# Patient Record
Sex: Male | Born: 1965 | Race: Black or African American | Hispanic: No | Marital: Single | Smoking: Never smoker
Health system: Southern US, Community
[De-identification: ages and names within clinical notes are randomized; demographics above are authoritative.]

## PROBLEM LIST (undated history)

## (undated) DIAGNOSIS — M86651 Other chronic osteomyelitis, right thigh: Secondary | ICD-10-CM

## (undated) DIAGNOSIS — E669 Obesity, unspecified: Secondary | ICD-10-CM

## (undated) DIAGNOSIS — I10 Essential (primary) hypertension: Secondary | ICD-10-CM

## (undated) DIAGNOSIS — M545 Low back pain, unspecified: Secondary | ICD-10-CM

## (undated) DIAGNOSIS — F4310 Post-traumatic stress disorder, unspecified: Secondary | ICD-10-CM

## (undated) DIAGNOSIS — M1990 Unspecified osteoarthritis, unspecified site: Secondary | ICD-10-CM

## (undated) DIAGNOSIS — G894 Chronic pain syndrome: Secondary | ICD-10-CM

## (undated) DIAGNOSIS — R0600 Dyspnea, unspecified: Secondary | ICD-10-CM

## (undated) DIAGNOSIS — G8929 Other chronic pain: Secondary | ICD-10-CM

## (undated) HISTORY — PX: BACK SURGERY: SHX140

## (undated) HISTORY — PX: FRACTURE SURGERY: SHX138

## (undated) HISTORY — DX: Obesity, unspecified: E66.9

## (undated) HISTORY — DX: Essential (primary) hypertension: I10

---

## 1999-02-20 HISTORY — PX: KNEE ARTHROSCOPY W/ ACL RECONSTRUCTION: SHX1858

## 2005-02-19 HISTORY — PX: ANTERIOR CERVICAL DECOMP/DISCECTOMY FUSION: SHX1161

## 2005-06-19 HISTORY — PX: LABRAL REPAIR: SHX5172

## 2007-02-20 HISTORY — PX: RESECTION DISTAL CLAVICAL: SHX5053

## 2010-12-05 ENCOUNTER — Emergency Department (HOSPITAL_COMMUNITY)
Admission: EM | Admit: 2010-12-05 | Discharge: 2010-12-05 | Disposition: A | Discharge status: Home / Self Care | Payer: Medicare Other | Attending: Emergency Medicine | Admitting: Emergency Medicine

## 2010-12-05 DIAGNOSIS — M546 Pain in thoracic spine: Secondary | ICD-10-CM | POA: Insufficient documentation

## 2010-12-05 DIAGNOSIS — M542 Cervicalgia: Secondary | ICD-10-CM | POA: Insufficient documentation

## 2010-12-05 DIAGNOSIS — M25519 Pain in unspecified shoulder: Secondary | ICD-10-CM | POA: Insufficient documentation

## 2010-12-05 DIAGNOSIS — G8929 Other chronic pain: Secondary | ICD-10-CM | POA: Insufficient documentation

## 2011-02-18 ENCOUNTER — Emergency Department (HOSPITAL_COMMUNITY): Payer: Medicare Other

## 2011-02-18 ENCOUNTER — Emergency Department (HOSPITAL_COMMUNITY)
Admission: EM | Admit: 2011-02-18 | Discharge: 2011-02-18 | Disposition: A | Discharge status: Home / Self Care | Payer: Medicare Other | Attending: Emergency Medicine | Admitting: Emergency Medicine

## 2011-02-18 DIAGNOSIS — IMO0001 Reserved for inherently not codable concepts without codable children: Secondary | ICD-10-CM | POA: Insufficient documentation

## 2011-02-18 DIAGNOSIS — R059 Cough, unspecified: Secondary | ICD-10-CM | POA: Insufficient documentation

## 2011-02-18 DIAGNOSIS — Z79899 Other long term (current) drug therapy: Secondary | ICD-10-CM | POA: Insufficient documentation

## 2011-02-18 DIAGNOSIS — J069 Acute upper respiratory infection, unspecified: Secondary | ICD-10-CM

## 2011-02-18 DIAGNOSIS — R070 Pain in throat: Secondary | ICD-10-CM | POA: Insufficient documentation

## 2011-02-18 DIAGNOSIS — Z981 Arthrodesis status: Secondary | ICD-10-CM | POA: Insufficient documentation

## 2011-02-18 DIAGNOSIS — J3489 Other specified disorders of nose and nasal sinuses: Secondary | ICD-10-CM | POA: Insufficient documentation

## 2011-02-18 DIAGNOSIS — R6883 Chills (without fever): Secondary | ICD-10-CM | POA: Insufficient documentation

## 2011-02-18 DIAGNOSIS — R05 Cough: Secondary | ICD-10-CM | POA: Insufficient documentation

## 2011-02-18 MED ORDER — ACETAMINOPHEN-CODEINE 120-12 MG/5ML PO SUSP: 5.0000 mL | Freq: Three times a day (TID) | ORAL | Status: AC | PRN

## 2011-02-18 NOTE — ED Notes (Signed)
Pt stated he has been having a headache along with the cough for the past four days on left side of head.

## 2011-02-18 NOTE — ED Provider Notes (Signed)
History     CSN: [id number]  Arrival date & time 02/18/11  7829   First MD Initiated Contact with Patient 02/18/11 [phone]      Chief Complaint  Patient presents with  . Cough  . Nasal Congestion    (Consider location/radiation/quality/duration/timing/severity/associated sxs/prior treatment) Patient is a 45 y.o. male presenting with cough. The history is provided by the patient.  Cough This is a new problem. Episode onset: 4 days ago. The problem occurs hourly. The problem has not changed since onset.The cough is productive of sputum. There has been no fever. Associated symptoms include chills, rhinorrhea, sore throat and myalgias. Pertinent negatives include no chest pain, no shortness of breath and no eye redness. Treatments tried: TheraFlu and Mucinex with minimal relief. Risk factors: No known sick contacts. Patient is disabled and does not work. He is not a smoker. His past medical history does not include COPD or asthma.   moderate in severity. No pain described as "sore" no radiation of pain. No painful swallowing or difficulty breathing. No alleviating symptoms, does take narcotic medications for chronic pain, but that has not improved his cough any.  History reviewed. No pertinent past medical history.  Past Surgical History  Procedure Date  . Cervical fusion     History reviewed. No pertinent family history.  History  Substance Use Topics  . Smoking status: Never Smoker   . Smokeless tobacco: Not on file  . Alcohol Use: Yes     occ      Review of Systems  Constitutional: Positive for chills.  HENT: Positive for congestion, sore throat and rhinorrhea.   Eyes: Negative for photophobia and redness.  Respiratory: Positive for cough. Negative for shortness of breath.   Cardiovascular: Negative for chest pain, palpitations and leg swelling.  Genitourinary: Negative for dysuria and flank pain.  Musculoskeletal: Positive for myalgias.  Skin: Negative for rash.    Neurological: Negative for dizziness.  All other systems reviewed and are negative.    Allergies  Review of patient's allergies indicates no known allergies.  Home Medications   Current Outpatient Rx  Name Route Sig Dispense Refill  . CYCLOBENZAPRINE HCL 10 MG PO TABS Oral Take 10 mg by mouth 3 (three) times daily as needed. Muscle pain     . THERAFLU COLD/COUGH NIGHTTIME PO Oral Take 1 Package by mouth once. For cough and cold symptoms     . GUAIFENESIN ER 600 MG PO TB12 Oral Take 1,200 mg by mouth 2 (two) times daily. congestion     . HYDROCODONE-ACETAMINOPHEN 10-325 MG PO TABS Oral Take 1 tablet by mouth every 12 (twelve) hours as needed. Pain      . OXYCODONE HCL ER 10 MG PO TB12 Oral Take 10 mg by mouth every 12 (twelve) hours. Pain        BP 164/100  Pulse 96  Temp(Src) 98.8 F (37.1 C) (Oral)  Resp 22  SpO2 99%  Physical Exam  Constitutional: He is oriented to person, place, and time. He appears well-developed and well-nourished.  HENT:  Head: Normocephalic and atraumatic.       Nasal congestion  Eyes: Conjunctivae and EOM are normal. Pupils are equal, round, and reactive to light.  Neck: Trachea normal. Neck supple. No thyromegaly present.  Cardiovascular: Normal rate, regular rhythm, S1 normal, S2 normal and normal pulses.     No systolic murmur is present   No diastolic murmur is present  Pulses:      Radial pulses are 2+  on the right side, and 2+ on the left side.  Pulmonary/Chest: Effort normal and breath sounds normal. He has no wheezes. He has no rhonchi. He has no rales. He exhibits no tenderness.  Abdominal: Soft. Normal appearance and bowel sounds are normal. There is no tenderness. There is no CVA tenderness and negative Murphy's sign.  Musculoskeletal:       BLE:s Calves nontender, no cords or erythema, negative Homans sign  Neurological: He is alert and oriented to person, place, and time. He has normal strength. No cranial nerve deficit or sensory  deficit. GCS eye subscore is 4. GCS verbal subscore is 5. GCS motor subscore is 6.  Skin: Skin is warm and dry. No rash noted. He is not diaphoretic.  Psychiatric: His speech is normal.       Cooperative and appropriate    ED Course  Procedures (including critical care time)  Pulse ox 98% on room air is adequate.  Dg Chest 2 View  02/18/2011  *RADIOLOGY REPORT*  Clinical Data: Pain, cough, and congestion for 4 days.  CHEST - 2 VIEW  Comparison: None.  Findings: Shallow inspiration.  Normal heart size and pulmonary vascularity.  Slight fibrosis or linear atelectasis in the right lung base.  No focal airspace consolidation.  No blunting of costophrenic angles.  No pneumothorax.  Postoperative changes in the lower cervical spine.  IMPRESSION: No evidence of active pulmonary disease.  Original Report Authenticated By: Marlon Pel, M.D.      MDM  URI symptoms. Chest x-ray obtained and reviewed as above for productive cough. No pneumonia. No wheezes or shortness of breath or indication for albuterol. Plan discharge home with URI precautions and tells codeine as needed for cough. PCP followup as needed        Sunnie Nielsen, MD 02/18/11 [phone]

## 2011-02-18 NOTE — ED Notes (Signed)
Pt c/o cold sx x1wk. Pt c/o cold chills, cough, congestion. Pt in NAD at this time. Pt has productive cough and states sputum is green.

## 2011-02-26 ENCOUNTER — Emergency Department (HOSPITAL_COMMUNITY)
Admission: EM | Admit: 2011-02-26 | Discharge: 2011-02-26 | Disposition: A | Discharge status: Home / Self Care | Payer: Medicare Other | Attending: Emergency Medicine | Admitting: Emergency Medicine

## 2011-02-26 ENCOUNTER — Encounter (HOSPITAL_COMMUNITY): Payer: Self-pay | Admitting: Emergency Medicine

## 2011-02-26 DIAGNOSIS — Z79899 Other long term (current) drug therapy: Secondary | ICD-10-CM | POA: Insufficient documentation

## 2011-02-26 DIAGNOSIS — Z981 Arthrodesis status: Secondary | ICD-10-CM | POA: Insufficient documentation

## 2011-02-26 DIAGNOSIS — G8929 Other chronic pain: Secondary | ICD-10-CM | POA: Insufficient documentation

## 2011-02-26 DIAGNOSIS — M25519 Pain in unspecified shoulder: Secondary | ICD-10-CM | POA: Insufficient documentation

## 2011-02-26 NOTE — ED Provider Notes (Signed)
Medical screening examination/treatment/procedure(s) were performed by non-physician practitioner and as supervising physician I was immediately available for consultation/collaboration.  Doug Sou, MD 02/26/11 1752

## 2011-02-26 NOTE — ED Notes (Signed)
Pt sts sent here by pain management clinic for eval for PT

## 2011-02-26 NOTE — ED Provider Notes (Signed)
History     CSN: [id number]  Arrival date & time 02/26/11  1143   First MD Initiated Contact with Patient 02/26/11 1252      Chief Complaint  Patient presents with  . Medical Clearance    (Consider location/radiation/quality/duration/timing/severity/associated sxs/prior treatment) HPI  46 year old male presenting to the ED requesting for referral to an orthopedic doctor for his chronic right shoulder pain. Patient states he has had right shoulder pain for the past several years and has been managed by a orthopedic doctor in [place]. 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  He is currently under the care of the pain management clinic ([facility]: [phone]). Pt sts he was advised to come to the ER to request referral for outpt therapy for his R shoulder.  Patient denies increasing shoulder pain, or recent trauma. Patient is not requesting for pain medication at this time. His only request is for a referral so he can have an outpatient therapy.  History reviewed. No pertinent past medical history.  Past Surgical History  Procedure Date  . Cervical fusion     History reviewed. No pertinent family history.  History  Substance Use Topics  . Smoking status: Never Smoker   . Smokeless tobacco: Not on file  . Alcohol Use: Yes     occ      Review of Systems  All other systems reviewed and are negative.    Allergies  Review of patient's allergies indicates no known allergies.  Home Medications   Current Outpatient Rx  Name Route Sig Dispense Refill  . ACETAMINOPHEN-CODEINE 120-12 MG/5ML PO SUSP Oral Take 5 mLs by mouth every 8 (eight) hours as needed (Take as needed for cough. No Driving while medicated.). 60 mL 0  . VITAMIN D-3 5000 UNITS PO TABS Oral Take 1 tablet by mouth once a week.      . CYCLOBENZAPRINE HCL 10 MG PO TABS Oral Take 10 mg by mouth 3 (three) times daily as needed. Muscle pain     .  HYDROCODONE-ACETAMINOPHEN 10-325 MG PO TABS Oral Take 1 tablet by mouth every 12 (twelve) hours as needed. Pain      . OXYCODONE HCL ER 10 MG PO TB12 Oral Take 10 mg by mouth every 12 (twelve) hours. Pain        BP 179/102  Pulse 96  Temp(Src) 98 F (36.7 C) (Oral)  Resp 20  SpO2 97%  Physical Exam  Nursing note and vitals reviewed. Constitutional: He appears well-developed and well-nourished. No distress.       Awake, alert, nontoxic appearance  HENT:  Head: Atraumatic.  Eyes: Right eye exhibits no discharge. Left eye exhibits no discharge.  Neck: Neck supple.  Cardiovascular: Normal rate.   Pulmonary/Chest: Effort normal. He exhibits no tenderness.  Abdominal: There is no tenderness. There is no rebound.  Musculoskeletal: He exhibits no tenderness.       Right shoulder: He exhibits decreased range of motion and decreased strength. He exhibits no tenderness, no bony tenderness, no swelling and no deformity.       Left shoulder: Normal.       Right elbow: Normal.      Baseline ROM, no obvious new focal weakness  Neurological:       Mental status and motor strength appears baseline for patient and situation  Skin: No rash noted.  Psychiatric: He has a normal mood and affect.    ED Course  Procedures (including critical care time)  Labs Reviewed - No data to display No results found.   No diagnosis found.    MDM  Pt in no acute distress.  R shoulder pain is chronic.  He has no acutte changes to his pain or having other sxs.  At this time, i will give referral to orthopedic doctor for further management.         Fayrene Helper, PA 02/26/11 1427

## 2012-04-10 ENCOUNTER — Ambulatory Visit: Payer: Medicare Other | Attending: Internal Medicine | Admitting: Rehabilitative and Restorative Service Providers"

## 2012-04-10 DIAGNOSIS — M542 Cervicalgia: Secondary | ICD-10-CM | POA: Insufficient documentation

## 2012-04-10 DIAGNOSIS — M2569 Stiffness of other specified joint, not elsewhere classified: Secondary | ICD-10-CM | POA: Insufficient documentation

## 2012-04-10 DIAGNOSIS — IMO0001 Reserved for inherently not codable concepts without codable children: Secondary | ICD-10-CM | POA: Insufficient documentation

## 2012-04-10 DIAGNOSIS — M545 Low back pain, unspecified: Secondary | ICD-10-CM | POA: Insufficient documentation

## 2012-04-17 ENCOUNTER — Ambulatory Visit: Payer: Medicare Other | Admitting: Physical Therapy

## 2012-04-22 ENCOUNTER — Encounter (HOSPITAL_COMMUNITY): Payer: Self-pay | Admitting: Emergency Medicine

## 2012-04-22 ENCOUNTER — Emergency Department (HOSPITAL_COMMUNITY): Payer: Medicare Other

## 2012-04-22 ENCOUNTER — Emergency Department (HOSPITAL_COMMUNITY)
Admission: EM | Admit: 2012-04-22 | Discharge: 2012-04-22 | Disposition: A | Discharge status: Home / Self Care | Payer: Medicare Other | Attending: Emergency Medicine | Admitting: Emergency Medicine

## 2012-04-22 ENCOUNTER — Ambulatory Visit: Payer: Medicare Other | Attending: Internal Medicine

## 2012-04-22 DIAGNOSIS — IMO0001 Reserved for inherently not codable concepts without codable children: Secondary | ICD-10-CM | POA: Insufficient documentation

## 2012-04-22 DIAGNOSIS — R51 Headache: Secondary | ICD-10-CM | POA: Insufficient documentation

## 2012-04-22 DIAGNOSIS — R0982 Postnasal drip: Secondary | ICD-10-CM | POA: Insufficient documentation

## 2012-04-22 DIAGNOSIS — M545 Low back pain, unspecified: Secondary | ICD-10-CM | POA: Insufficient documentation

## 2012-04-22 DIAGNOSIS — M542 Cervicalgia: Secondary | ICD-10-CM | POA: Insufficient documentation

## 2012-04-22 DIAGNOSIS — Z79899 Other long term (current) drug therapy: Secondary | ICD-10-CM | POA: Insufficient documentation

## 2012-04-22 DIAGNOSIS — J069 Acute upper respiratory infection, unspecified: Secondary | ICD-10-CM | POA: Insufficient documentation

## 2012-04-22 DIAGNOSIS — M2569 Stiffness of other specified joint, not elsewhere classified: Secondary | ICD-10-CM | POA: Insufficient documentation

## 2012-04-22 DIAGNOSIS — R05 Cough: Secondary | ICD-10-CM | POA: Insufficient documentation

## 2012-04-22 DIAGNOSIS — R059 Cough, unspecified: Secondary | ICD-10-CM | POA: Insufficient documentation

## 2012-04-22 DIAGNOSIS — J00 Acute nasopharyngitis [common cold]: Secondary | ICD-10-CM | POA: Insufficient documentation

## 2012-04-22 MED ORDER — HYDROCODONE-HOMATROPINE 5-1.5 MG/5ML PO SYRP: 2.5000 mL | ORAL_SOLUTION | Freq: Four times a day (QID) | ORAL | Status: DC | PRN

## 2012-04-22 NOTE — ED Notes (Signed)
Patient complaining of cold symptoms that started four days ago; symptoms worsening tonight.  Patient complaining of productive cough (with green sputum production), mid-back pain, nasal congestion, watery eyes, and sinus drainage.  Patient denies shortness of breath and chest pain.  Upon arrival to room, patient connected to continuous blood pressure and pulse ox monitor.  Will continue to monitor.

## 2012-04-22 NOTE — ED Provider Notes (Signed)
Medical screening examination/treatment/procedure(s) were performed by non-physician practitioner and as supervising physician I was immediately available for consultation/collaboration.  Jones Skene, M.D.     Jones Skene, MD 04/22/12 [phone]

## 2012-04-22 NOTE — ED Provider Notes (Signed)
History     CSN: [id number]  Arrival date & time 04/22/12  0407   First MD Initiated Contact with Patient 04/22/12 [phone]      Chief Complaint  Patient presents with  . Nasal Congestion  . Cough    (Consider location/radiation/quality/duration/timing/severity/associated sxs/prior treatment) HPI SUBJECTIVE:  Dustin [name] is a 47 y.o. male who complains of coryza, post nasal drip, productive cough, myalgias, headache and bilateral sinus pain for 4 days. He denies a history of chest pain, dizziness, fatigue, nausea, rashshortness of breath, sweats, vomiting, weakness, weight loss, wheezing and cough and denies a history of asthma. Patient denies smoke cigarettes. Patient recently babysitting his 3 grandchildren who all had similar sxs.     PLAN:   History reviewed. No pertinent past medical history.  Past Surgical History  Procedure Laterality Date  . Cervical fusion    . Shoulder surgery      History reviewed. No pertinent family history.  History  Substance Use Topics  . Smoking status: Never Smoker   . Smokeless tobacco: Not on file  . Alcohol Use: Yes     Comment: occ      Review of Systems  Allergies  Review of patient's allergies indicates no known allergies.  Home Medications   Current Outpatient Rx  Name  Route  Sig  Dispense  Refill  . Cholecalciferol (VITAMIN D-3) 5000 UNITS TABS   Oral   Take 1 tablet by mouth once a week.           . cyclobenzaprine (FLEXERIL) 10 MG tablet   Oral   Take 10 mg by mouth 3 (three) times daily as needed. Muscle pain          . HYDROcodone-acetaminophen (NORCO) 10-325 MG per tablet   Oral   Take 1 tablet by mouth every 12 (twelve) hours as needed. Pain           . oxyCODONE (OXYCONTIN) 10 MG 12 hr tablet   Oral   Take 10 mg by mouth every 12 (twelve) hours. Pain             BP 163/107  Pulse 100  Temp(Src) 97.6 F (36.4 C) (Oral)  Resp 20  SpO2 98%  Physical Exam  Appears moderately ill but  not toxic; temperature as noted in vitals. Ears normal. Eyes:glassy appearance, no discharge  Heart: RRR, NO M/G/R Throat and pharynx normal.   Neck supple. No adenopathyhy in the neck.  Sinuses non tender.  The chest is clear. Abdomen is soft and nontender   ED Course  Procedures (including critical care time)  Labs Reviewed - No data to display No results found.   No diagnosis found.    MDM  5:05 AM Patient symptoms consistent with viral URI. Requests cxr.  Symptomatic therapy suggested: push fluids, rest and gargle warm salt water. Lack of antibiotic effectiveness discussed with him.   Pt CXR negative for acute infiltrate. Patients symptoms are consistent with URI, likely viral etiology. Discussed that antibiotics are not indicated for viral infections. Pt will be discharged with symptomatic treatment.  Verbalizes understanding and is agreeable with plan. Pt is hemodynamically stable & in NAD prior to dc.     Arthor Captain, PA-C 04/22/12 [phone]

## 2012-04-22 NOTE — ED Notes (Signed)
Patient currently resting quietly in bed; no respiratory or acute distress noted.  Patient updated on plan of care; informed patient that we are currently waiting on further orders/disposition from EDP/PA.  Patient denies any other needs at this time; will continue to monitor.

## 2012-04-22 NOTE — ED Notes (Signed)
Patient given copy of discharge paperwork; went over discharge instructions with patient.  Patient instructed to take prescriptions as directed, to not take prescriptions while taking other prescribed narcotics, to not drive/operate heavy machinery while taking narcotics, to follow up with PCP within four days, and to return to the ED for new, worsening, or concerning symptoms.

## 2012-04-24 ENCOUNTER — Encounter: Payer: Medicare Other | Admitting: Physical Therapy

## 2012-04-24 ENCOUNTER — Ambulatory Visit: Payer: Medicare Other | Admitting: Physical Therapy

## 2012-04-29 ENCOUNTER — Encounter: Payer: Medicare Other | Admitting: Physical Therapy

## 2012-06-04 ENCOUNTER — Emergency Department (HOSPITAL_COMMUNITY): Payer: Medicare Other

## 2012-06-04 ENCOUNTER — Encounter (HOSPITAL_COMMUNITY): Payer: Self-pay | Admitting: Family Medicine

## 2012-06-04 ENCOUNTER — Emergency Department (HOSPITAL_COMMUNITY)
Admission: EM | Admit: 2012-06-04 | Discharge: 2012-06-04 | Disposition: A | Discharge status: Home / Self Care | Payer: Medicare Other | Attending: Emergency Medicine | Admitting: Emergency Medicine

## 2012-06-04 DIAGNOSIS — I10 Essential (primary) hypertension: Secondary | ICD-10-CM

## 2012-06-04 DIAGNOSIS — Z79899 Other long term (current) drug therapy: Secondary | ICD-10-CM | POA: Insufficient documentation

## 2012-06-04 DIAGNOSIS — J069 Acute upper respiratory infection, unspecified: Secondary | ICD-10-CM

## 2012-06-04 MED ORDER — LISINOPRIL 10 MG PO TABS
5.0000 mg | ORAL_TABLET | Freq: Once | ORAL | Status: AC
  Administered 2012-06-04: 5 mg via ORAL
  Filled 2012-06-04: qty 2

## 2012-06-04 NOTE — ED Notes (Signed)
Pt sent here for follow up chest xray. sts cough and acute bronchitis. sts has been taking abx

## 2012-06-04 NOTE — ED Provider Notes (Signed)
History     CSN: [id number]  Arrival date & time 06/04/12  1135   First MD Initiated Contact with Patient 06/04/12 1147      Chief Complaint  Patient presents with  . Cough    (Consider location/radiation/quality/duration/timing/severity/associated sxs/prior treatment) HPI  Patient presents to the ED with complaints of continued cough. He was seen earlier and told he has a URI. He was given abx and some cough medication. He feels as though the cough has moved deeper and "wet- er" now. He would like to get rechecked to make sure he is not developing a pneumonia. He denies fevers, chills, nausea, vomiting, diarrhea. nad vss  History reviewed. No pertinent past medical history.  Past Surgical History  Procedure Laterality Date  . Cervical fusion    . Shoulder surgery      History reviewed. No pertinent family history.  History  Substance Use Topics  . Smoking status: Never Smoker   . Smokeless tobacco: Not on file  . Alcohol Use: Yes     Comment: occ      Review of Systems  Respiratory: Positive for cough. Negative for chest tightness, shortness of breath and wheezing.   All other systems reviewed and are negative.    Allergies  Review of patient's allergies indicates no known allergies.  Home Medications   Current Outpatient Rx  Name  Route  Sig  Dispense  Refill  . Ascorbic Acid (VITAMIN C PO)   Oral   Take 1 tablet by mouth daily.         . cyclobenzaprine (FLEXERIL) 10 MG tablet   Oral   Take 10 mg by mouth 3 (three) times daily as needed. Muscle pain          . oxyCODONE (OXYCONTIN) 40 MG 12 hr tablet   Oral   Take 40 mg by mouth every 12 (twelve) hours.         Marland Kitchen oxyCODONE (ROXICODONE) 15 MG immediate release tablet   Oral   Take 15 mg by mouth every 6 (six) hours as needed for pain.           BP 168/116  Pulse 89  Temp(Src) 98.2 F (36.8 C) (Oral)  Resp 18  SpO2 97%  Physical Exam  Nursing note and vitals  reviewed. Constitutional: He appears well-developed and well-nourished. No distress.  HENT:  Head: Normocephalic and atraumatic.  Eyes: Pupils are equal, round, and reactive to light.  Neck: Normal range of motion. Neck supple.  Cardiovascular: Normal rate and regular rhythm.   Pulmonary/Chest: Effort normal.  Abdominal: Soft.  Neurological: He is alert.  Skin: Skin is warm and dry.    ED Course  Procedures (including critical care time)  Labs Reviewed - No data to display Dg Chest 2 View  06/04/2012  *RADIOLOGY REPORT*  Clinical Data: Follow-up respiratory infection treated with antibiotics  CHEST - 2 VIEW  Comparison: 04/1938 1014  Findings: Heart size is normal.  Mediastinal shadows are normal. The lungs are clear.  No effusions.  No bony abnormalities.  IMPRESSION: Normal chest   Original Report Authenticated By: Paulina Fusi, M.D.      1. URI (upper respiratory infection)   2. Hypertension       MDM  Blood pressure is elevated.  Will have nurse recheck.  BP is still elevated- patient has been seen for BP before he said. He is supposed to be on lisinopril but wanted to try to control hypertension with exercise and diet.  Discussed the severe risks of BP staying that elevated. He agreed to start taking his Lisinopril and see his PCP. First dose given in ED. Asymptomatic.    Exam and chest xray shows no acute abnormalities.  Pt has been advised of the symptoms that warrant their return to the ED. Patient has voiced understanding and has agreed to follow-up with the PCP or specialist.         Dorthula Matas, PA-C 06/04/12 1310

## 2012-06-06 NOTE — ED Provider Notes (Signed)
Medical screening examination/treatment/procedure(s) were performed by non-physician practitioner and as supervising physician I was immediately available for consultation/collaboration.  Jmari T Ayisha Pol, MD 06/06/12 0821 

## 2012-07-18 ENCOUNTER — Encounter: Payer: Self-pay | Admitting: *Deleted

## 2012-07-18 ENCOUNTER — Encounter: Payer: Medicare Other | Attending: Internal Medicine | Admitting: *Deleted

## 2012-07-18 DIAGNOSIS — I10 Essential (primary) hypertension: Secondary | ICD-10-CM | POA: Insufficient documentation

## 2012-07-18 DIAGNOSIS — Z713 Dietary counseling and surveillance: Secondary | ICD-10-CM | POA: Insufficient documentation

## 2012-07-18 DIAGNOSIS — E669 Obesity, unspecified: Secondary | ICD-10-CM | POA: Insufficient documentation

## 2012-07-18 NOTE — Patient Instructions (Signed)
Plan:  Aim for 600 mg Sodium per meal (2000 mg per day)  Consider reading food labels for Total Sodium of foods Consider increasing your activity level by biking for 30 minutes daily as tolerated

## 2012-07-18 NOTE — Progress Notes (Signed)
  Medical Nutrition Therapy:  Appt start time: 0800 end time:  0900.  Assessment:  Primary concerns today: patient here for obesity and HTN. He lives with daughter and grandchildren. He shops for his food and they both prepare the meals. He is disabled so is home during the day. He prefers to not add seasonings especially salt to his food. Sleeps from about MN to about 4 AM and then takes nap around 12 noon. He also states he does a colon cleanse once a month for 5 days. Activity level is limited due to shoulder problems. He used to mountain bike a couple of years ago, but sold his bike when he moved here about 18 months ago. Expresses interest in riding again.  MEDICATIONS: see list   DIETARY INTAKE:  Usual eating pattern includes 3 meals and 1-2 snacks per day.  Everyday foods include good variety of all food groups, low salt and low fat .  Avoided foods include milk, beef, pork, high fat foods.    24-hr recall:  B ( AM): PNB oatmeal with cinnamon and honey mixture OR scrambled eggs with olive oil, ginger and pepper, garlic powder with 1 slice raisin toast, fresy apple cinnamon and honey tea Snk ( AM):2 fruited granola bars or celery with PNB and flavored water  L ( PM): salad with nuts, cranberries, chicken, egg, other vegetables, with raspberry vinaigrette dressing OR baked fish with vegetables and/or salad Snk ( PM): not usually D ( PM): daughter cooks later at night; pasta with vegetables and lean meat Snk ( PM): none Beverages: flavored water, hot tea with honey and cinammon Lemon, maple syrup and cayenne pepper capsules all day with no food for 5 days each month  Usual physical activity: walks as able and a lot of stretches with the band. Has swam in the past at [facility] or [place], not lately, got bored  Progress Towards Goal(s):  In progress.   Nutritional Diagnosis:  NB-1.1 Food and nutrition-related knowledge deficit As related to HTN and obesity.  As evidenced by BMI of 36.3.     Intervention:  Nutrition counseling for weight loss and HTN initiated. His eating habits are adequate for sodium intake with a couple of exceptions which I clarified with him. [facility] still has sodium in it.). We discussed options for increasing his activity level taking his bad shoulders into consideration and he became excited about getting back into cycling. He was unaware of the multiple trails in this area so I provided him with handouts on local trails. Also introduced him to [employer] APP to help him reduce his sodium intake. Plan:  Aim for 600 mg Sodium per meal (2000 mg per day)  Consider reading food labels for Total Sodium of foods Consider increasing your activity level by biking for 30 minutes daily as tolerated  Use [employer] APP for sodium content of foods and restaurant  Handouts given during visit include:  Food Label handouts Bike Trails in [place]  Monitoring/Evaluation:  Dietary intake, exercise, read food labels for sodium content of foods, and body weight prn.

## 2012-09-16 ENCOUNTER — Encounter (HOSPITAL_BASED_OUTPATIENT_CLINIC_OR_DEPARTMENT_OTHER): Payer: Self-pay | Admitting: *Deleted

## 2012-09-16 ENCOUNTER — Emergency Department (HOSPITAL_BASED_OUTPATIENT_CLINIC_OR_DEPARTMENT_OTHER)
Admission: EM | Admit: 2012-09-16 | Discharge: 2012-09-16 | Disposition: A | Discharge status: Home / Self Care | Payer: Medicare Other | Attending: Emergency Medicine | Admitting: Emergency Medicine

## 2012-09-16 DIAGNOSIS — R0982 Postnasal drip: Secondary | ICD-10-CM | POA: Insufficient documentation

## 2012-09-16 DIAGNOSIS — Z91040 Latex allergy status: Secondary | ICD-10-CM | POA: Insufficient documentation

## 2012-09-16 DIAGNOSIS — G8929 Other chronic pain: Secondary | ICD-10-CM | POA: Insufficient documentation

## 2012-09-16 DIAGNOSIS — E669 Obesity, unspecified: Secondary | ICD-10-CM | POA: Insufficient documentation

## 2012-09-16 DIAGNOSIS — I10 Essential (primary) hypertension: Secondary | ICD-10-CM | POA: Insufficient documentation

## 2012-09-16 DIAGNOSIS — R062 Wheezing: Secondary | ICD-10-CM | POA: Insufficient documentation

## 2012-09-16 DIAGNOSIS — Z79899 Other long term (current) drug therapy: Secondary | ICD-10-CM | POA: Insufficient documentation

## 2012-09-16 MED ORDER — LORATADINE 10 MG PO TABS: 10.0000 mg | ORAL_TABLET | Freq: Every day | ORAL | Status: DC

## 2012-09-16 MED ORDER — DOXYCYCLINE HYCLATE 100 MG PO CAPS: 100.0000 mg | ORAL_CAPSULE | Freq: Two times a day (BID) | ORAL | Status: DC

## 2012-09-16 MED ORDER — BENZONATATE 100 MG PO CAPS: 100.0000 mg | ORAL_CAPSULE | Freq: Three times a day (TID) | ORAL | Status: DC

## 2012-09-16 MED ORDER — FLUTICASONE PROPIONATE 50 MCG/ACT NA SUSP: 2.0000 | Freq: Every day | NASAL | Status: DC

## 2012-09-16 NOTE — ED Notes (Addendum)
Pt c/o sinus congestion and cough x3 days. Pt also c/o cough productive of brown sputum.

## 2012-09-16 NOTE — ED Provider Notes (Signed)
CSN: [id number]     Arrival date & time 09/16/12  3086 History     First MD Initiated Contact with Patient 09/16/12 0303     Chief Complaint  Patient presents with  . Sinusitis   (Consider location/radiation/quality/duration/timing/severity/associated sxs/prior Treatment) Patient is a 47 y.o. male presenting with sinusitis. The history is provided by the patient.  Sinusitis Pain details:    Location:  Maxillary   Quality:  Dull   Severity:  Mild   Timing:  Constant Progression:  Unchanged Context: not smoke inhalation   Relieved by:  Nothing Worsened by:  Nothing tried Ineffective treatments:  None tried Associated symptoms: rhinorrhea and sneezing   Associated symptoms: no chest pain, no fever, no headaches and no sore throat   Risk factors: no allergic reaction     Past Medical History  Diagnosis Date  . Hypertension   . Obesity   . Chronic back pain    Past Surgical History  Procedure Laterality Date  . Cervical fusion    . Shoulder surgery     No family history on file. History  Substance Use Topics  . Smoking status: Never Smoker   . Smokeless tobacco: Never Used  . Alcohol Use: Yes     Comment: occasional, special events like a wedding    Review of Systems  Constitutional: Negative for fever.  HENT: Positive for rhinorrhea, sneezing, postnasal drip and sinus pressure. Negative for sore throat, drooling, mouth sores, trouble swallowing, neck pain, neck stiffness and voice change.   Cardiovascular: Negative for chest pain.  Neurological: Negative for headaches.  All other systems reviewed and are negative.    Allergies  Latex  Home Medications   Current Outpatient Rx  Name  Route  Sig  Dispense  Refill  . Ascorbic Acid (VITAMIN C PO)   Oral   Take 1 tablet by mouth daily.         . benzonatate (TESSALON) 100 MG capsule   Oral   Take 1 capsule (100 mg total) by mouth every 8 (eight) hours.   21 capsule   0   . cyclobenzaprine (FLEXERIL)  10 MG tablet   Oral   Take 10 mg by mouth 3 (three) times daily as needed. Muscle pain          . doxycycline (VIBRAMYCIN) 100 MG capsule   Oral   Take 1 capsule (100 mg total) by mouth 2 (two) times daily. One po bid x 7 days   14 capsule   0   . fluticasone (FLONASE) 50 MCG/ACT nasal spray   Nasal   Place 2 sprays into the nose daily.   16 g   0   . loratadine (CLARITIN) 10 MG tablet   Oral   Take 1 tablet (10 mg total) by mouth daily.   7 tablet   0   . oxyCODONE (OXYCONTIN) 40 MG 12 hr tablet   Oral   Take 40 mg by mouth every 12 (twelve) hours.         Marland Kitchen oxyCODONE (ROXICODONE) 15 MG immediate release tablet   Oral   Take 15 mg by mouth every 6 (six) hours as needed for pain.          BP 178/106  Pulse 94  Temp(Src) 98.7 F (37.1 C) (Oral)  Resp 18  Ht 6\' 1"  (1.854 m)  Wt 260 lb (117.935 kg)  BMI 34.31 kg/m2  SpO2 98% Physical Exam  Constitutional: He is oriented to person, place,  and time. He appears well-developed and well-nourished. No distress.  HENT:  Head: Normocephalic and atraumatic.  Mouth/Throat: No oropharyngeal exudate.  Cobblestoning consistent with PND  Eyes: Conjunctivae and EOM are normal. Pupils are equal, round, and reactive to light.  Neck: Normal range of motion. Neck supple.  Cardiovascular: Normal rate, regular rhythm and intact distal pulses.   Pulmonary/Chest: Effort normal and breath sounds normal. He has no wheezes. He has no rales.  Abdominal: Soft. Bowel sounds are normal. There is no tenderness. There is no rebound and no guarding.  Musculoskeletal: Normal range of motion.  Lymphadenopathy:    He has no cervical adenopathy.  Neurological: He is oriented to person, place, and time.  Skin: Skin is warm and dry.  Psychiatric: He has a normal mood and affect.    ED Course   Procedures (including critical care time)  Labs Reviewed - No data to display No results found. 1. Post-nasal drip     MDM  Will treat for  post nasal drip  Armaan Pond K Kewon Statler-Rasch, MD 09/16/12 [phone]

## 2012-10-04 ENCOUNTER — Encounter (HOSPITAL_BASED_OUTPATIENT_CLINIC_OR_DEPARTMENT_OTHER): Payer: Self-pay | Admitting: *Deleted

## 2012-10-04 ENCOUNTER — Emergency Department (HOSPITAL_BASED_OUTPATIENT_CLINIC_OR_DEPARTMENT_OTHER)
Admission: EM | Admit: 2012-10-04 | Discharge: 2012-10-04 | Disposition: A | Discharge status: Home / Self Care | Payer: Medicare Other | Attending: Emergency Medicine | Admitting: Emergency Medicine

## 2012-10-04 DIAGNOSIS — G8929 Other chronic pain: Secondary | ICD-10-CM | POA: Insufficient documentation

## 2012-10-04 DIAGNOSIS — E669 Obesity, unspecified: Secondary | ICD-10-CM | POA: Insufficient documentation

## 2012-10-04 DIAGNOSIS — T5891XA Toxic effect of carbon monoxide from unspecified source, accidental (unintentional), initial encounter: Secondary | ICD-10-CM | POA: Insufficient documentation

## 2012-10-04 DIAGNOSIS — Z79899 Other long term (current) drug therapy: Secondary | ICD-10-CM | POA: Insufficient documentation

## 2012-10-04 DIAGNOSIS — Y92009 Unspecified place in unspecified non-institutional (private) residence as the place of occurrence of the external cause: Secondary | ICD-10-CM | POA: Insufficient documentation

## 2012-10-04 DIAGNOSIS — Z7729 Contact with and (suspected ) exposure to other hazardous substances: Secondary | ICD-10-CM

## 2012-10-04 DIAGNOSIS — Y9389 Activity, other specified: Secondary | ICD-10-CM | POA: Insufficient documentation

## 2012-10-04 DIAGNOSIS — I10 Essential (primary) hypertension: Secondary | ICD-10-CM | POA: Insufficient documentation

## 2012-10-04 DIAGNOSIS — IMO0002 Reserved for concepts with insufficient information to code with codable children: Secondary | ICD-10-CM | POA: Insufficient documentation

## 2012-10-04 DIAGNOSIS — T5894XA Toxic effect of carbon monoxide from unspecified source, undetermined, initial encounter: Secondary | ICD-10-CM | POA: Insufficient documentation
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
## 2012-10-04 NOTE — ED Notes (Signed)
Carboxyhemaglobin percent done via handheld Pulse-CO oximeter. Reading of 9% on room air initial reading. Placed patient on Venti mask at 50% to help with flushing out CO. MD aware, will continue to monitor.

## 2012-10-04 NOTE — ED Notes (Signed)
Rechecked patients SpCO% and on 50% Venti Mask it has dropped to 2.0 back to normal. Patient off Venti Mask and back on Room Air.

## 2013-01-09 ENCOUNTER — Emergency Department (HOSPITAL_COMMUNITY)
Admission: EM | Admit: 2013-01-09 | Discharge: 2013-01-09 | Disposition: A | Discharge status: Home / Self Care | Payer: Medicare Other | Attending: Emergency Medicine | Admitting: Emergency Medicine

## 2013-01-09 DIAGNOSIS — Z91040 Latex allergy status: Secondary | ICD-10-CM | POA: Insufficient documentation

## 2013-01-09 DIAGNOSIS — Y9389 Activity, other specified: Secondary | ICD-10-CM | POA: Insufficient documentation

## 2013-01-09 DIAGNOSIS — S139XXA Sprain of joints and ligaments of unspecified parts of neck, initial encounter: Secondary | ICD-10-CM | POA: Insufficient documentation

## 2013-01-09 DIAGNOSIS — I10 Essential (primary) hypertension: Secondary | ICD-10-CM | POA: Insufficient documentation

## 2013-01-09 DIAGNOSIS — Z79899 Other long term (current) drug therapy: Secondary | ICD-10-CM | POA: Insufficient documentation

## 2013-01-09 DIAGNOSIS — G8929 Other chronic pain: Secondary | ICD-10-CM | POA: Insufficient documentation

## 2013-01-09 DIAGNOSIS — IMO0002 Reserved for concepts with insufficient information to code with codable children: Secondary | ICD-10-CM | POA: Insufficient documentation

## 2013-01-09 DIAGNOSIS — T148XXA Other injury of unspecified body region, initial encounter: Secondary | ICD-10-CM

## 2013-01-09 DIAGNOSIS — Y92410 Unspecified street and highway as the place of occurrence of the external cause: Secondary | ICD-10-CM | POA: Insufficient documentation

## 2013-01-09 DIAGNOSIS — E669 Obesity, unspecified: Secondary | ICD-10-CM | POA: Insufficient documentation

## 2013-01-09 DIAGNOSIS — S161XXA Strain of muscle, fascia and tendon at neck level, initial encounter: Secondary | ICD-10-CM

## 2013-01-09 MED ORDER — IBUPROFEN 800 MG PO TABS: 800.0000 mg | ORAL_TABLET | Freq: Three times a day (TID) | ORAL | Status: DC

## 2013-01-09 NOTE — ED Notes (Signed)
PT ambulated with baseline gait; VSS; A&Ox3; no signs of distress; respirations even and unlabored; skin warm and dry; no questions upon discharge.  
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
## 2013-01-09 NOTE — ED Provider Notes (Signed)
Medical screening examination/treatment/procedure(s) were performed by non-physician practitioner and as supervising physician I was immediately available for consultation/collaboration.  EKG Interpretation   None         Charles B. Sheldon, MD 01/09/13 1921 
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
## 2013-01-13 ENCOUNTER — Ambulatory Visit (HOSPITAL_COMMUNITY)
Admission: RE | Admit: 2013-01-13 | Discharge: 2013-01-13 | Disposition: A | Discharge status: Home / Self Care | Payer: Medicare Other | Source: Ambulatory Visit | Attending: Internal Medicine | Admitting: Internal Medicine

## 2013-01-13 ENCOUNTER — Other Ambulatory Visit (HOSPITAL_COMMUNITY): Payer: Self-pay | Admitting: Internal Medicine

## 2013-01-13 DIAGNOSIS — R52 Pain, unspecified: Secondary | ICD-10-CM

## 2013-01-13 DIAGNOSIS — M545 Low back pain, unspecified: Secondary | ICD-10-CM | POA: Insufficient documentation

## 2013-01-13 DIAGNOSIS — M47817 Spondylosis without myelopathy or radiculopathy, lumbosacral region: Secondary | ICD-10-CM | POA: Insufficient documentation

## 2013-01-13 DIAGNOSIS — M542 Cervicalgia: Secondary | ICD-10-CM | POA: Insufficient documentation

## 2013-01-23 ENCOUNTER — Other Ambulatory Visit: Payer: Self-pay | Admitting: Internal Medicine

## 2013-01-23 DIAGNOSIS — M5430 Sciatica, unspecified side: Secondary | ICD-10-CM

## 2013-01-29 ENCOUNTER — Ambulatory Visit
Admission: RE | Admit: 2013-01-29 | Discharge: 2013-01-29 | Disposition: A | Discharge status: Home / Self Care | Payer: Medicare Other | Source: Ambulatory Visit | Attending: Internal Medicine | Admitting: Internal Medicine

## 2013-01-29 DIAGNOSIS — M5430 Sciatica, unspecified side: Secondary | ICD-10-CM

## 2013-01-29 MED ORDER — GADOBENATE DIMEGLUMINE 529 MG/ML IV SOLN
20.0000 mL | Freq: Once | INTRAVENOUS | Status: AC | PRN
  Administered 2013-01-29: 20 mL via INTRAVENOUS

## 2013-03-28 ENCOUNTER — Encounter (HOSPITAL_BASED_OUTPATIENT_CLINIC_OR_DEPARTMENT_OTHER): Payer: Self-pay | Admitting: Emergency Medicine

## 2013-03-28 ENCOUNTER — Emergency Department (HOSPITAL_BASED_OUTPATIENT_CLINIC_OR_DEPARTMENT_OTHER)
Admission: EM | Admit: 2013-03-28 | Discharge: 2013-03-28 | Disposition: A | Discharge status: Home / Self Care | Payer: Medicare Other | Attending: Emergency Medicine | Admitting: Emergency Medicine

## 2013-03-28 ENCOUNTER — Emergency Department (HOSPITAL_BASED_OUTPATIENT_CLINIC_OR_DEPARTMENT_OTHER): Payer: Medicare Other

## 2013-03-28 DIAGNOSIS — I10 Essential (primary) hypertension: Secondary | ICD-10-CM | POA: Insufficient documentation

## 2013-03-28 DIAGNOSIS — Z79899 Other long term (current) drug therapy: Secondary | ICD-10-CM | POA: Insufficient documentation

## 2013-03-28 DIAGNOSIS — J069 Acute upper respiratory infection, unspecified: Secondary | ICD-10-CM

## 2013-03-28 DIAGNOSIS — E669 Obesity, unspecified: Secondary | ICD-10-CM | POA: Insufficient documentation

## 2013-03-28 DIAGNOSIS — Z91040 Latex allergy status: Secondary | ICD-10-CM | POA: Insufficient documentation

## 2013-03-28 DIAGNOSIS — R000 Tachycardia, unspecified: Secondary | ICD-10-CM | POA: Insufficient documentation

## 2013-03-28 DIAGNOSIS — G8929 Other chronic pain: Secondary | ICD-10-CM | POA: Insufficient documentation

## 2013-03-28 MED ORDER — HYDROCODONE-HOMATROPINE 5-1.5 MG/5ML PO SYRP: 5.0000 mL | ORAL_SOLUTION | Freq: Four times a day (QID) | ORAL | Status: DC | PRN

## 2013-03-28 NOTE — ED Notes (Signed)
Reports sneezing, sore throat, runny cough, cough.  Denies fever, body aches.  Using Mucinex with no relief.

## 2013-03-28 NOTE — ED Provider Notes (Signed)
CSN: [id number]     Arrival date & time 03/28/13  1355 History   First MD Initiated Contact with Patient 03/28/13 1404     Chief Complaint  Patient presents with  . URI   (Consider location/radiation/quality/duration/timing/severity/associated sxs/prior Treatment) Patient is a 48 y.o. male presenting with URI. The history is provided by the patient. No language interpreter was used.  URI Presenting symptoms: cough, rhinorrhea and sore throat   Presenting symptoms: no fatigue and no fever   Cough:    Cough characteristics:  Hacking   Sputum characteristics:  Nondescript   Severity:  Moderate   Onset quality:  Gradual   Duration:  4 days   Timing:  Constant   Progression:  Unchanged   Chronicity:  New Rhinorrhea:    Quality:  Clear   Severity:  Moderate   Duration:  4 days   Timing:  Constant Severity:  Moderate Onset quality:  Gradual Duration:  4 days Timing:  Constant Progression:  Unchanged Chronicity:  New Relieved by:  Nothing Worsened by:  Nothing tried Ineffective treatments:  None tried Associated symptoms: no arthralgias and no neck pain   Risk factors: not elderly, no chronic cardiac disease, no chronic kidney disease, no recent illness, no recent travel and no sick contacts     Past Medical History  Diagnosis Date  . Hypertension   . Obesity   . Chronic back pain    Past Surgical History  Procedure Laterality Date  . Cervical fusion    . Shoulder surgery     No family history on file. History  Substance Use Topics  . Smoking status: Never Smoker   . Smokeless tobacco: Never Used  . Alcohol Use: Yes     Comment: occasional, special events like a wedding    Review of Systems  Constitutional: Negative for fever, chills and fatigue.  HENT: Positive for rhinorrhea and sore throat. Negative for trouble swallowing.   Eyes: Negative for visual disturbance.  Respiratory: Positive for cough. Negative for shortness of breath.   Cardiovascular: Negative  for chest pain and palpitations.  Gastrointestinal: Negative for nausea, vomiting, abdominal pain and diarrhea.  Genitourinary: Negative for dysuria and difficulty urinating.  Musculoskeletal: Negative for arthralgias and neck pain.  Skin: Negative for color change.  Neurological: Negative for dizziness and weakness.  Psychiatric/Behavioral: Negative for dysphoric mood.    Allergies  Latex  Home Medications   Current Outpatient Rx  Name  Route  Sig  Dispense  Refill  . Ascorbic Acid (VITAMIN C PO)   Oral   Take 1 tablet by mouth daily.         . cyclobenzaprine (FLEXERIL) 10 MG tablet   Oral   Take 10 mg by mouth 3 (three) times daily as needed. Muscle pain          . ibuprofen (ADVIL,MOTRIN) 800 MG tablet   Oral   Take 1 tablet (800 mg total) by mouth 3 (three) times daily.   21 tablet   0   . oxyCODONE (OXYCONTIN) 40 MG 12 hr tablet   Oral   Take 40 mg by mouth every 12 (twelve) hours.         Marland Kitchen oxyCODONE (ROXICODONE) 15 MG immediate release tablet   Oral   Take 15 mg by mouth every 6 (six) hours as needed for pain.          BP 173/100  Pulse 108  Temp(Src) 98.1 F (36.7 C) (Oral)  Resp 20  Ht  6\' 1"  (1.854 m)  Wt 265 lb (120.203 kg)  BMI 34.97 kg/m2  SpO2 97% Physical Exam  Nursing note and vitals reviewed. Constitutional: He is oriented to person, place, and time. He appears well-developed and well-nourished. No distress.  HENT:  Head: Normocephalic and atraumatic.  Mouth/Throat: Oropharynx is clear and moist. No oropharyngeal exudate.  Eyes: Conjunctivae and EOM are normal.  Neck: Normal range of motion.  Cardiovascular: Regular rhythm.  Exam reveals no gallop and no friction rub.   No murmur heard. Tachycardic   Pulmonary/Chest: Effort normal and breath sounds normal. He has no wheezes. He has no rales. He exhibits no tenderness.  Abdominal: Soft. He exhibits no distension. There is no tenderness. There is no rebound.  Musculoskeletal: Normal  range of motion.  Neurological: He is alert and oriented to person, place, and time. Coordination normal.  Speech is goal-oriented. Moves limbs without ataxia.   Skin: Skin is warm and dry.  Psychiatric: He has a normal mood and affect. His behavior is normal.    ED Course  Procedures (including critical care time) Labs Review Labs Reviewed - No data to display Imaging Review Dg Chest 2 View  03/28/2013   CLINICAL DATA:  Cough.  Pharyngitis.  EXAM: CHEST  2 VIEW  COMPARISON:  06/04/2012.  FINDINGS: The heart remains normal in size and the lungs remain clear. Cervical spine fixation hardware.  IMPRESSION: No acute abnormality.   Electronically Signed   By: Enrique Sack M.D.   On: 03/28/2013 14:31    EKG Interpretation   None       MDM   1. URI (upper respiratory infection)     3:05 PM Chest xray unremarkable for acute changes. Patient slightly tachycardic with remaining vitals stable. Patient afebrile. Patient likely has viral illness. Patient taking Mucinex at home. Patient advised to continue taking Mucinex at home. Patient will have hycodan as needed for cough. Patient will return with worsening or concerning symptoms.     Alvina Chou, PA-C 03/28/13 1510

## 2013-03-28 NOTE — Discharge Instructions (Signed)
Continue to take Mucinex at home. Take Hycodan as needed for cough. Refer to attached documents for more information. Follow up with your doctor as needed.

## 2013-03-28 NOTE — ED Provider Notes (Signed)
Medical screening examination/treatment/procedure(s) were performed by non-physician practitioner and as supervising physician I was immediately available for consultation/collaboration.     Veryl Speak, MD 03/28/13 [phone]

## 2013-07-29 ENCOUNTER — Emergency Department (HOSPITAL_COMMUNITY)
Admission: EM | Admit: 2013-07-29 | Discharge: 2013-07-29 | Disposition: A | Discharge status: Home / Self Care | Payer: No Typology Code available for payment source | Attending: Emergency Medicine | Admitting: Emergency Medicine

## 2013-07-29 ENCOUNTER — Emergency Department (HOSPITAL_COMMUNITY): Payer: No Typology Code available for payment source

## 2013-07-29 ENCOUNTER — Encounter (HOSPITAL_COMMUNITY): Payer: Self-pay | Admitting: Emergency Medicine

## 2013-07-29 DIAGNOSIS — S0993XA Unspecified injury of face, initial encounter: Secondary | ICD-10-CM | POA: Insufficient documentation

## 2013-07-29 DIAGNOSIS — Z79899 Other long term (current) drug therapy: Secondary | ICD-10-CM | POA: Diagnosis not present

## 2013-07-29 DIAGNOSIS — Y9389 Activity, other specified: Secondary | ICD-10-CM | POA: Insufficient documentation

## 2013-07-29 DIAGNOSIS — S239XXA Sprain of unspecified parts of thorax, initial encounter: Secondary | ICD-10-CM | POA: Diagnosis not present

## 2013-07-29 DIAGNOSIS — I10 Essential (primary) hypertension: Secondary | ICD-10-CM | POA: Diagnosis not present

## 2013-07-29 DIAGNOSIS — Y92410 Unspecified street and highway as the place of occurrence of the external cause: Secondary | ICD-10-CM | POA: Diagnosis not present

## 2013-07-29 DIAGNOSIS — S199XXA Unspecified injury of neck, initial encounter: Secondary | ICD-10-CM

## 2013-07-29 DIAGNOSIS — E669 Obesity, unspecified: Secondary | ICD-10-CM | POA: Insufficient documentation

## 2013-07-29 DIAGNOSIS — Z91040 Latex allergy status: Secondary | ICD-10-CM | POA: Insufficient documentation

## 2013-07-29 DIAGNOSIS — M545 Low back pain, unspecified: Secondary | ICD-10-CM

## 2013-07-29 DIAGNOSIS — G8929 Other chronic pain: Secondary | ICD-10-CM | POA: Diagnosis not present

## 2013-07-29 DIAGNOSIS — Z9889 Other specified postprocedural states: Secondary | ICD-10-CM | POA: Insufficient documentation

## 2013-07-29 DIAGNOSIS — IMO0002 Reserved for concepts with insufficient information to code with codable children: Secondary | ICD-10-CM | POA: Diagnosis present

## 2013-07-29 DIAGNOSIS — S29012A Strain of muscle and tendon of back wall of thorax, initial encounter: Secondary | ICD-10-CM

## 2013-07-29 MED ORDER — KETOROLAC TROMETHAMINE 60 MG/2ML IM SOLN
60.0000 mg | Freq: Once | INTRAMUSCULAR | Status: DC
  Filled 2013-07-29: qty 2
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
## 2013-07-29 NOTE — ED Notes (Signed)
MD at bedside. 
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
## 2013-07-29 NOTE — Discharge Instructions (Signed)
Recommend you add ibuprofen 600mg  every 6 hours to your daily regimen to help improve swelling. Take Flexeril for muscle spasms as needed.  Muscle Strain A muscle strain is an injury that occurs when a muscle is stretched beyond its normal length. Usually a small number of muscle fibers are torn when this happens. Muscle strain is rated in degrees. First-degree strains have the least amount of muscle fiber tearing and pain. Second-degree and third-degree strains have increasingly more tearing and pain.  Usually, recovery from muscle strain takes 1 2 weeks. Complete healing takes 5 6 weeks.  CAUSES  Muscle strain happens when a sudden, violent force placed on a muscle stretches it too far. This may occur with lifting, sports, or a fall.  RISK FACTORS Muscle strain is especially common in athletes.  SIGNS AND SYMPTOMS At the site of the muscle strain, there may be:  Pain.  Bruising.  Swelling.  Difficulty using the muscle due to pain or lack of normal function. DIAGNOSIS  Your health care provider will perform a physical exam and ask about your medical history. TREATMENT  Often, the best treatment for a muscle strain is resting, icing, and applying cold compresses to the injured area.  HOME CARE INSTRUCTIONS   Use the PRICE method of treatment to promote muscle healing during the first 2 3 days after your injury. The PRICE method involves:  Protecting the muscle from being injured again.  Restricting your activity and resting the injured body part.  Icing your injury. To do this, put ice in a plastic bag. Place a towel between your skin and the bag. Then, apply the ice and leave it on from 15 20 minutes each hour. After the third day, switch to moist heat packs.  Apply compression to the injured area with a splint or elastic bandage. Be careful not to wrap it too tightly. This may interfere with blood circulation or increase swelling.  Elevate the injured body part above the level  of your heart as often as you can.  Only take over-the-counter or prescription medicines for pain, discomfort, or fever as directed by your health care provider.  Warming up prior to exercise helps to prevent future muscle strains. SEEK MEDICAL CARE IF:   You have increasing pain or swelling in the injured area.  You have numbness, tingling, or a significant loss of strength in the injured area. MAKE SURE YOU:   Understand these instructions.  Will watch your condition.  Will get help right away if you are not doing well or get worse. Document Released: 02/05/2005 Document Revised: 11/26/2012 Document Reviewed: 09/04/2012 [facility] Patient Information 2014 [place], [place].

## 2013-07-31 NOTE — ED Provider Notes (Signed)
Medical screening examination/treatment/procedure(s) were performed by non-physician practitioner and as supervising physician I was immediately available for consultation/collaboration.   EKG Interpretation None        Julianne Rice, MD 07/31/13 [phone]

## 2013-08-16 ENCOUNTER — Encounter (HOSPITAL_COMMUNITY): Payer: Self-pay | Admitting: Emergency Medicine

## 2013-08-16 ENCOUNTER — Emergency Department (HOSPITAL_COMMUNITY)
Admission: EM | Admit: 2013-08-16 | Discharge: 2013-08-16 | Disposition: A | Discharge status: Home / Self Care | Payer: No Typology Code available for payment source | Attending: Emergency Medicine | Admitting: Emergency Medicine

## 2013-08-16 DIAGNOSIS — Z79899 Other long term (current) drug therapy: Secondary | ICD-10-CM | POA: Insufficient documentation

## 2013-08-16 DIAGNOSIS — M549 Dorsalgia, unspecified: Secondary | ICD-10-CM | POA: Insufficient documentation

## 2013-08-16 DIAGNOSIS — J069 Acute upper respiratory infection, unspecified: Secondary | ICD-10-CM | POA: Insufficient documentation

## 2013-08-16 DIAGNOSIS — Z91040 Latex allergy status: Secondary | ICD-10-CM | POA: Insufficient documentation

## 2013-08-16 DIAGNOSIS — G8929 Other chronic pain: Secondary | ICD-10-CM | POA: Insufficient documentation

## 2013-08-16 DIAGNOSIS — I10 Essential (primary) hypertension: Secondary | ICD-10-CM | POA: Diagnosis not present

## 2013-08-16 DIAGNOSIS — E669 Obesity, unspecified: Secondary | ICD-10-CM | POA: Insufficient documentation

## 2013-08-16 DIAGNOSIS — R05 Cough: Secondary | ICD-10-CM | POA: Diagnosis present

## 2013-08-16 DIAGNOSIS — R059 Cough, unspecified: Secondary | ICD-10-CM | POA: Diagnosis present

## 2013-08-16 MED ORDER — GUAIFENESIN ER 600 MG PO TB12: 600.0000 mg | ORAL_TABLET | Freq: Two times a day (BID) | ORAL | Status: DC

## 2013-08-16 MED ORDER — BENZONATATE 100 MG PO CAPS: 100.0000 mg | ORAL_CAPSULE | Freq: Three times a day (TID) | ORAL | Status: DC

## 2013-08-16 MED ORDER — CETIRIZINE-PSEUDOEPHEDRINE ER 5-120 MG PO TB12: 1.0000 | ORAL_TABLET | Freq: Two times a day (BID) | ORAL | Status: DC

## 2013-08-16 NOTE — ED Provider Notes (Signed)
CSN: [id number]     Arrival date & time 08/16/13  2101 History   First MD Initiated Contact with Patient 08/16/13 2204    This chart was scribed for non-physician practitioner, Hazel Sams, PA, working with Hoy Morn, MD by Terressa Koyanagi, ED Scribe. This patient was seen in room [place] and the patient's care was started at 11:00 PM.  PCP: Philis Fendt, MD  Chief Complaint  Patient presents with  . Cough  . URI  . Back Pain   Patient is a 48 y.o. male presenting with URI and back pain. The history is provided by the patient. No language interpreter was used.  URI Presenting symptoms: congestion and cough   Associated symptoms: sneezing   Back Pain  HPI Comments: Dustin [name] is a 48 y.o. male, with a history of HTN, obesity, chronic neck pain and chronic back pain, who presents to the Emergency Department complaining of cold like Sx. Specifically, pt complains of congestion, sneezing, and cough onset one week ago. Pt reports taking Mucinex at home without much relief. PT reports he had similar Sx one year ago at which time he started tessalon perles and it worked very well. Pt denies fever, n/v/d. Pt reports he has been taking pain meds at home since 2006 for chronic pain issues, but, otherwise denies taking any other meds regularly.    Past Medical History  Diagnosis Date  . Hypertension   . Obesity   . Chronic back pain    Past Surgical History  Procedure Laterality Date  . Cervical fusion    . Shoulder surgery     History reviewed. No pertinent family history. History  Substance Use Topics  . Smoking status: Never Smoker   . Smokeless tobacco: Never Used  . Alcohol Use: Yes     Comment: occasional, special events like a wedding    Review of Systems  HENT: Positive for congestion and sneezing.   Respiratory: Positive for cough.   Gastrointestinal: Negative for nausea, vomiting and diarrhea.  All other systems reviewed and are negative.  Allergies   Latex  Home Medications   Prior to Admission medications   Medication Sig Start Date End Date Taking? Authorizing Provider  Ascorbic Acid (VITAMIN C PO) Take 1 tablet by mouth daily.   Yes Historical Provider, MD  cyclobenzaprine (FLEXERIL) 10 MG tablet Take 10 mg by mouth 3 (three) times daily as needed. Muscle pain    Yes Historical Provider, MD  DM-GG-PE & APAP-Diphenhyd-PE (MUCINEX FAST-MAX DAY/NIGHT) (TABLET) MISC Take 2 tablets by mouth 2 (two) times daily as needed (for cold symptoms).   Yes Historical Provider, MD  oxyCODONE (OXYCONTIN) 40 MG 12 hr tablet Take 40 mg by mouth every 12 (twelve) hours.   Yes Historical Provider, MD  oxyCODONE (ROXICODONE) 15 MG immediate release tablet Take 15 mg by mouth every 6 (six) hours as needed for pain.   Yes Historical Provider, MD   Triage Vitals: BP 158/100  Pulse 94  Temp(Src) 98.1 F (36.7 C) (Oral)  Resp 20  SpO2 98% Physical Exam  Nursing note and vitals reviewed. Constitutional: He is oriented to person, place, and time. He appears well-developed and well-nourished.  HENT:  Head: Normocephalic.  Right Ear: Tympanic membrane normal.  Left Ear: Tympanic membrane normal.  Mouth/Throat: Oropharynx is clear and moist.  Edema of the nasal mucosa. There is poor of note that through the left nostril and some discharge within.  Neck: Normal range of motion. Neck supple.  No  meningeal signs  Cardiovascular: Normal rate and regular rhythm.   Pulmonary/Chest: Effort normal and breath sounds normal. No respiratory distress.  Abdominal: Soft.  Musculoskeletal: Normal range of motion.  Lymphadenopathy:    He has no cervical adenopathy.  Neurological: He is alert and oriented to person, place, and time.  Skin: Skin is warm. No rash noted.  Psychiatric: He has a normal mood and affect. His behavior is normal.    ED Course  Procedures    DIAGNOSTIC STUDIES: Oxygen Saturation is 98% on RA, nl by my interpretation.    COORDINATION OF  CARE: 11:06 PM-Discussed treatment plan which includes meds with pt at bedside and pt agreed to plan.     MDM   Final diagnoses:  URI (upper respiratory infection)    I personally performed the services described in this documentation, which was scribed in my presence. The recorded information has been reviewed and is accurate.   Martie Lee, PA-C 08/18/13 [phone]

## 2013-08-16 NOTE — Discharge Instructions (Signed)
Upper Respiratory Infection, Adult An upper respiratory infection (URI) is also sometimes known as the common cold. The upper respiratory tract includes the nose, sinuses, throat, trachea, and bronchi. Bronchi are the airways leading to the lungs. Most people improve within 1 week, but symptoms can last up to 2 weeks. A residual cough may last even longer.  CAUSES Many different viruses can infect the tissues lining the upper respiratory tract. The tissues become irritated and inflamed and often become very moist. Mucus production is also common. A cold is contagious. You can easily spread the virus to others by oral contact. This includes kissing, sharing a glass, coughing, or sneezing. Touching your mouth or nose and then touching a surface, which is then touched by another person, can also spread the virus. SYMPTOMS  Symptoms typically develop 1 to 3 days after you come in contact with a cold virus. Symptoms vary from person to person. They may include:  Runny nose.  Sneezing.  Nasal congestion.  Sinus irritation.  Sore throat.  Loss of voice (laryngitis).  Cough.  Fatigue.  Muscle aches.  Loss of appetite.  Headache.  Low-grade fever. DIAGNOSIS  You might diagnose your own cold based on familiar symptoms, since most people get a cold 2 to 3 times a year. Your caregiver can confirm this based on your exam. Most importantly, your caregiver can check that your symptoms are not due to another disease such as strep throat, sinusitis, pneumonia, asthma, or epiglottitis. Blood tests, throat tests, and X-rays are not necessary to diagnose a common cold, but they may sometimes be helpful in excluding other more serious diseases. Your caregiver will decide if any further tests are required. RISKS AND COMPLICATIONS  You may be at risk for a more severe case of the common cold if you smoke cigarettes, have chronic heart disease (such as heart failure) or lung disease (such as asthma), or if  you have a weakened immune system. The very young and very old are also at risk for more serious infections. Bacterial sinusitis, middle ear infections, and bacterial pneumonia can complicate the common cold. The common cold can worsen asthma and chronic obstructive pulmonary disease (COPD). Sometimes, these complications can require emergency medical care and may be life-threatening. PREVENTION  The best way to protect against getting a cold is to practice good hygiene. Avoid oral or hand contact with people with cold symptoms. Wash your hands often if contact occurs. There is no clear evidence that vitamin C, vitamin E, echinacea, or exercise reduces the chance of developing a cold. However, it is always recommended to get plenty of rest and practice good nutrition. TREATMENT  Treatment is directed at relieving symptoms. There is no cure. Antibiotics are not effective, because the infection is caused by a virus, not by bacteria. Treatment may include:  Increased fluid intake. Sports drinks offer valuable electrolytes, sugars, and fluids.  Breathing heated mist or steam (vaporizer or shower).  Eating chicken soup or other clear broths, and maintaining good nutrition.  Getting plenty of rest.  Using gargles or lozenges for comfort.  Controlling fevers with ibuprofen or acetaminophen as directed by your caregiver.  Increasing usage of your inhaler if you have asthma. Zinc gel and zinc lozenges, taken in the first 24 hours of the common cold, can shorten the duration and lessen the severity of symptoms. Pain medicines may help with fever, muscle aches, and throat pain. A variety of non-prescription medicines are available to treat congestion and runny nose. Your caregiver   can make recommendations and may suggest nasal or lung inhalers for other symptoms.  HOME CARE INSTRUCTIONS   Only take over-the-counter or prescription medicines for pain, discomfort, or fever as directed by your  caregiver.  Use a warm mist humidifier or inhale steam from a shower to increase air moisture. This may keep secretions moist and make it easier to breathe.  Drink enough water and fluids to keep your urine clear or pale yellow.  Rest as needed.  Return to work when your temperature has returned to normal or as your caregiver advises. You may need to stay home longer to avoid infecting others. You can also use a face mask and careful hand washing to prevent spread of the virus. SEEK MEDICAL CARE IF:   After the first few days, you feel you are getting worse rather than better.  You need your caregiver's advice about medicines to control symptoms.  You develop chills, worsening shortness of breath, or brown or red sputum. These may be signs of pneumonia.  You develop yellow or brown nasal discharge or pain in the face, especially when you bend forward. These may be signs of sinusitis.  You develop a fever, swollen neck glands, pain with swallowing, or white areas in the back of your throat. These may be signs of strep throat. SEEK IMMEDIATE MEDICAL CARE IF:   You have a fever.  You develop severe or persistent headache, ear pain, sinus pain, or chest pain.  You develop wheezing, a prolonged cough, cough up blood, or have a change in your usual mucus (if you have chronic lung disease).  You develop sore muscles or a stiff neck. Document Released: 08/01/2000 Document Revised: 04/30/2011 Document Reviewed: 06/09/2010 [employer] Patient Information 2015 [employer]. This information is not intended to replace advice given to you by your health care provider. Make sure you discuss any questions you have with your health care provider.  

## 2013-08-16 NOTE — ED Notes (Signed)
Pt is c/o back and shoulder pain  Pt states he has chronic neck and back pain and is seeing a chiropractor and is on medication for this already but lately it has been hurting worse  Pt states he has a lot of cold symptoms and has a lot of congestion with sneezing and coughing  Pt states he thinks the coughing is making the back pain worse

## 2013-08-20 NOTE — ED Provider Notes (Signed)
Medical screening examination/treatment/procedure(s) were performed by non-physician practitioner and as supervising physician I was immediately available for consultation/collaboration.   EKG Interpretation None        Hoy Morn, MD 08/20/13 [phone]

## 2014-01-27 ENCOUNTER — Encounter: Payer: Self-pay | Admitting: Internal Medicine

## 2014-03-10 ENCOUNTER — Ambulatory Visit (AMBULATORY_SURGERY_CENTER): Payer: Self-pay

## 2014-03-10 VITALS — Ht 73.5 in | Wt 287.2 lb

## 2014-03-10 DIAGNOSIS — Z1211 Encounter for screening for malignant neoplasm of colon: Secondary | ICD-10-CM

## 2014-03-10 MED ORDER — MOVIPREP 100 G PO SOLR: 1.0000 | Freq: Once | ORAL | Status: DC

## 2014-03-10 NOTE — Progress Notes (Signed)
No allergies to eggs or soy No diet/weight loss meds No past problems with anesthesia No home oxygen  Has email  Emmi instructions given for colonoscopy

## 2014-03-14 ENCOUNTER — Emergency Department (HOSPITAL_BASED_OUTPATIENT_CLINIC_OR_DEPARTMENT_OTHER)
Admission: EM | Admit: 2014-03-14 | Discharge: 2014-03-14 | Disposition: A | Discharge status: Home / Self Care | Payer: Commercial Managed Care - HMO | Attending: Emergency Medicine | Admitting: Emergency Medicine

## 2014-03-14 ENCOUNTER — Encounter (HOSPITAL_BASED_OUTPATIENT_CLINIC_OR_DEPARTMENT_OTHER): Payer: Self-pay | Admitting: Emergency Medicine

## 2014-03-14 DIAGNOSIS — Z79899 Other long term (current) drug therapy: Secondary | ICD-10-CM | POA: Insufficient documentation

## 2014-03-14 DIAGNOSIS — J069 Acute upper respiratory infection, unspecified: Secondary | ICD-10-CM | POA: Diagnosis not present

## 2014-03-14 DIAGNOSIS — I10 Essential (primary) hypertension: Secondary | ICD-10-CM | POA: Diagnosis not present

## 2014-03-14 DIAGNOSIS — E669 Obesity, unspecified: Secondary | ICD-10-CM | POA: Insufficient documentation

## 2014-03-14 DIAGNOSIS — G8929 Other chronic pain: Secondary | ICD-10-CM | POA: Diagnosis not present

## 2014-03-14 DIAGNOSIS — Z91040 Latex allergy status: Secondary | ICD-10-CM | POA: Diagnosis not present

## 2014-03-14 DIAGNOSIS — R05 Cough: Secondary | ICD-10-CM | POA: Diagnosis present

## 2014-03-14 MED ORDER — FLUTICASONE PROPIONATE 50 MCG/ACT NA SUSP: 2.0000 | Freq: Every day | NASAL | Status: DC

## 2014-03-14 MED ORDER — CETIRIZINE-PSEUDOEPHEDRINE ER 5-120 MG PO TB12: 1.0000 | ORAL_TABLET | Freq: Two times a day (BID) | ORAL | Status: DC

## 2014-03-14 MED ORDER — BENZONATATE 100 MG PO CAPS
200.0000 mg | ORAL_CAPSULE | Freq: Once | ORAL | Status: AC
Start: 2014-03-14 — End: 2014-03-14
  Administered 2014-03-14: 200 mg via ORAL
  Filled 2014-03-14: qty 2

## 2014-03-14 MED ORDER — GUAIFENESIN ER 1200 MG PO TB12: 1.0000 | ORAL_TABLET | Freq: Every day | ORAL | Status: DC

## 2014-03-14 MED ORDER — BENZONATATE 100 MG PO CAPS: 100.0000 mg | ORAL_CAPSULE | Freq: Three times a day (TID) | ORAL | Status: DC

## 2014-03-14 MED ORDER — LORATADINE 10 MG PO TABS
10.0000 mg | ORAL_TABLET | Freq: Once | ORAL | Status: AC
  Administered 2014-03-14: 10 mg via ORAL
  Filled 2014-03-14: qty 1

## 2014-03-14 NOTE — ED Notes (Signed)
Pt reports productive cough x 1 week  With nasal congestion OTC meds. ineffective

## 2014-03-14 NOTE — Discharge Instructions (Signed)
Cool Mist Vaporizers °Vaporizers may help relieve the symptoms of a cough and cold. They add moisture to the air, which helps mucus to become thinner and less sticky. This makes it easier to breathe and cough up secretions. Cool mist vaporizers do not cause serious burns like hot mist vaporizers, which may also be called steamers or humidifiers. Vaporizers have not been proven to help with colds. You should not use a vaporizer if you are allergic to mold. °HOME CARE INSTRUCTIONS °· Follow the package instructions for the vaporizer. °· Do not use anything other than distilled water in the vaporizer. °· Do not run the vaporizer all of the time. This can cause mold or bacteria to grow in the vaporizer. °· Clean the vaporizer after each time it is used. °· Clean and dry the vaporizer well before storing it. °· Stop using the vaporizer if worsening respiratory symptoms develop. °Document Released: 11/03/2003 Document Revised: 02/10/2013 Document Reviewed: 06/25/2012 °[employer]® Patient Information ©2015 [employer]. This information is not intended to replace advice given to you by your health care provider. Make sure you discuss any questions you have with your health care provider. ° °

## 2014-03-14 NOTE — ED Provider Notes (Signed)
CSN: [id number]     Arrival date & time 03/14/14  0535 History   First MD Initiated Contact with Patient 03/14/14 [phone]     Chief Complaint  Patient presents with  . Cough     (Consider location/radiation/quality/duration/timing/severity/associated sxs/prior Treatment) Patient is a 49 y.o. male presenting with URI. The history is provided by the patient.  URI Presenting symptoms: congestion, cough and rhinorrhea   Presenting symptoms: no fever   Congestion:    Location:  Nasal Cough:    Severity:  Moderate   Onset quality:  Gradual   Timing:  Intermittent   Progression:  Unchanged   Chronicity:  New Rhinorrhea:    Quality:  Green   Severity:  Mild   Timing:  Constant   Progression:  Unchanged Severity:  Mild Onset quality:  Gradual Timing:  Constant Progression:  Unchanged Chronicity:  New Relieved by:  Nothing Worsened by:  Nothing tried Ineffective treatments:  None tried Associated symptoms: no arthralgias, no myalgias, no neck pain and no swollen glands   Risk factors: sick contacts   Risk factors: no immunosuppression, no recent illness and no recent travel   Risk factors comment:  States daughter is here with symptoms States whatever he was given when he was seen in June was great and lasted all summer  Past Medical History  Diagnosis Date  . Hypertension   . Obesity   . Chronic back pain    Past Surgical History  Procedure Laterality Date  . Cervical fusion    . Shoulder surgery     Family History  Problem Relation Age of Onset  . Colon cancer Neg Hx    History  Substance Use Topics  . Smoking status: Never Smoker   . Smokeless tobacco: Never Used  . Alcohol Use: Yes     Comment: occasional, special events like a wedding    Review of Systems  Constitutional: Negative for fever.  HENT: Positive for congestion and rhinorrhea.   Respiratory: Positive for cough. Negative for chest tightness and shortness of breath.   Cardiovascular: Negative for  chest pain and palpitations.  Musculoskeletal: Negative for myalgias, arthralgias and neck pain.  All other systems reviewed and are negative.     Allergies  Latex  Home Medications   Prior to Admission medications   Medication Sig Start Date End Date Taking? Authorizing Provider  Ascorbic Acid (VITAMIN C PO) Take 1 tablet by mouth daily.    Historical Provider, MD  benzonatate (TESSALON) 100 MG capsule Take 1 capsule (100 mg total) by mouth every 8 (eight) hours. 03/14/14   Marycatherine Maniscalco K Aayansh Codispoti-Rasch, MD  cetirizine-pseudoephedrine (ZYRTEC-D) 5-120 MG per tablet Take 1 tablet by mouth 2 (two) times daily. 08/16/13   Ruthell Rummage Dammen, PA-C  cetirizine-pseudoephedrine (ZYRTEC-D) 5-120 MG per tablet Take 1 tablet by mouth 2 (two) times daily. 03/14/14   Kathryn Linarez K Justyn Langham-Rasch, MD  cyclobenzaprine (FLEXERIL) 10 MG tablet Take 10 mg by mouth 3 (three) times daily as needed. Muscle pain     Historical Provider, MD  DM-GG-PE & APAP-Diphenhyd-PE (MUCINEX FAST-MAX DAY/NIGHT) (TABLET) MISC Take 2 tablets by mouth 2 (two) times daily as needed (for cold symptoms).    Historical Provider, MD  fluticasone (FLONASE) 50 MCG/ACT nasal spray Place 2 sprays into both nostrils daily. 03/14/14   Chaquetta Schlottman K Emmanuell Kantz-Rasch, MD  guaiFENesin (MUCINEX) 600 MG 12 hr tablet Take 1 tablet (600 mg total) by mouth 2 (two) times daily. 08/16/13   Martie Lee, PA-C  Guaifenesin  1200 MG TB12 Take 1 tablet (1,200 mg total) by mouth daily. 03/14/14   Kirti Carl K Sreya Froio-Rasch, MD  MOVIPREP 100 G SOLR Take 1 kit (200 g total) by mouth once. 03/10/14   Irene Shipper, MD  oxyCODONE (OXYCONTIN) 40 MG 12 hr tablet Take 40 mg by mouth every 12 (twelve) hours.    Historical Provider, MD  oxyCODONE (ROXICODONE) 15 MG immediate release tablet Take 15 mg by mouth every 6 (six) hours as needed for pain.    Historical Provider, MD   BP 157/98 mmHg  Pulse 103  Temp(Src) 98.7 F (37.1 C) (Oral)  Resp 18  Ht 6' 1.5" (1.867 m)  Wt 288 lb (130.636 kg)   BMI 37.48 kg/m2  SpO2 98% Physical Exam  Constitutional: He is oriented to person, place, and time. He appears well-developed and well-nourished. No distress.  HENT:  Head: Normocephalic and atraumatic.  Mouth/Throat: Oropharynx is clear and moist.  Clear colorless post nasal drip  Eyes: Conjunctivae and EOM are normal. Pupils are equal, round, and reactive to light.  Neck: Normal range of motion. Neck supple.  Cardiovascular: Normal rate, regular rhythm and intact distal pulses.   Pulmonary/Chest: Effort normal and breath sounds normal. No stridor. No respiratory distress. He has no wheezes. He has no rales. He exhibits no tenderness.  Abdominal: Soft. Bowel sounds are normal. There is no tenderness. There is no rebound and no guarding.  Musculoskeletal: Normal range of motion.  Neurological: He is alert and oriented to person, place, and time.  Skin: Skin is warm and dry.  Psychiatric: He has a normal mood and affect.    ED Course  Procedures (including critical care time) Labs Review Labs Reviewed - No data to display  Imaging Review No results found.   EKG Interpretation None      MDM   Final diagnoses:  URI (upper respiratory infection)    Will treat for URI.  Have prescribed all medications prescribed in June visit as patient stated those worked great and have added Flonase for congestion and post nasal drip.  Patient is on chronic narcotics for pain under pain management.  Follow up with your PMD for ongoing care.      Carlisle Beers, MD 03/14/14 [phone]

## 2014-03-24 ENCOUNTER — Telehealth: Payer: Self-pay | Admitting: Internal Medicine

## 2014-03-24 ENCOUNTER — Encounter: Payer: Medicare Other | Admitting: Internal Medicine

## 2014-03-24 NOTE — Telephone Encounter (Signed)
Charge. 

## 2014-03-30 ENCOUNTER — Encounter: Payer: Medicare Other | Admitting: Internal Medicine

## 2014-05-04 NOTE — Telephone Encounter (Signed)
Unable to bill patient cancell fee because he has Medicaid Insurance/yf

## 2014-08-12 ENCOUNTER — Emergency Department (HOSPITAL_BASED_OUTPATIENT_CLINIC_OR_DEPARTMENT_OTHER)
Admission: EM | Admit: 2014-08-12 | Discharge: 2014-08-13 | Disposition: A | Discharge status: Home / Self Care | Payer: Commercial Managed Care - HMO | Attending: Emergency Medicine | Admitting: Emergency Medicine

## 2014-08-12 ENCOUNTER — Encounter (HOSPITAL_BASED_OUTPATIENT_CLINIC_OR_DEPARTMENT_OTHER): Payer: Self-pay | Admitting: Emergency Medicine

## 2014-08-12 DIAGNOSIS — I10 Essential (primary) hypertension: Secondary | ICD-10-CM | POA: Diagnosis not present

## 2014-08-12 DIAGNOSIS — Y9302 Activity, running: Secondary | ICD-10-CM | POA: Diagnosis not present

## 2014-08-12 DIAGNOSIS — X58XXXA Exposure to other specified factors, initial encounter: Secondary | ICD-10-CM | POA: Diagnosis not present

## 2014-08-12 DIAGNOSIS — S86912A Strain of unspecified muscle(s) and tendon(s) at lower leg level, left leg, initial encounter: Secondary | ICD-10-CM | POA: Diagnosis not present

## 2014-08-12 DIAGNOSIS — Y998 Other external cause status: Secondary | ICD-10-CM | POA: Insufficient documentation

## 2014-08-12 DIAGNOSIS — Z79899 Other long term (current) drug therapy: Secondary | ICD-10-CM | POA: Insufficient documentation

## 2014-08-12 DIAGNOSIS — Y92008 Other place in unspecified non-institutional (private) residence as the place of occurrence of the external cause: Secondary | ICD-10-CM | POA: Insufficient documentation

## 2014-08-12 DIAGNOSIS — E669 Obesity, unspecified: Secondary | ICD-10-CM | POA: Diagnosis not present

## 2014-08-12 DIAGNOSIS — S8992XA Unspecified injury of left lower leg, initial encounter: Secondary | ICD-10-CM | POA: Diagnosis present

## 2014-08-12 DIAGNOSIS — G8929 Other chronic pain: Secondary | ICD-10-CM | POA: Insufficient documentation

## 2014-08-12 DIAGNOSIS — S99912A Unspecified injury of left ankle, initial encounter: Secondary | ICD-10-CM | POA: Diagnosis not present

## 2014-08-12 DIAGNOSIS — Z91040 Latex allergy status: Secondary | ICD-10-CM | POA: Insufficient documentation

## 2014-08-12 DIAGNOSIS — S86812A Strain of other muscle(s) and tendon(s) at lower leg level, left leg, initial encounter: Secondary | ICD-10-CM

## 2014-08-12 NOTE — ED Provider Notes (Signed)
CSN: [id number]     Arrival date & time 08/12/14  2324 History  This chart was scribed for Wandra Arthurs, MD by Stephania Fragmin, ED Scribe. This patient was seen in room MH02/MH02 and the patient's care was started at 11:47 PM.    Chief Complaint  Patient presents with  . Ankle Injury   The history is provided by the patient. No language interpreter was used.    HPI Comments: Dustin [name] is a 49 y.o. male who presents to the Emergency Department complaining of constant, moderate left calf pain due to an injury that occurred PTA. Patient was playing with his grandchildren and was pivoting his leg to run when he felt a pop in the back of his left lower leg. Patient takes oxycodone for his chronic neck and shoulder pain.   Past Medical History  Diagnosis Date  . Hypertension   . Obesity   . Chronic back pain    Past Surgical History  Procedure Laterality Date  . Cervical fusion    . Shoulder surgery     Family History  Problem Relation Age of Onset  . Colon cancer Neg Hx    History  Substance Use Topics  . Smoking status: Never Smoker   . Smokeless tobacco: Never Used  . Alcohol Use: Yes     Comment: occasional, special events like a wedding    Review of Systems  Musculoskeletal: Positive for myalgias (left calf pain) and arthralgias (left ankle pain).  All other systems reviewed and are negative.   Allergies  Latex  Home Medications   Prior to Admission medications   Medication Sig Start Date End Date Taking? Authorizing Provider  Ascorbic Acid (VITAMIN C PO) Take 1 tablet by mouth daily.    Historical Provider, MD  benzonatate (TESSALON) 100 MG capsule Take 1 capsule (100 mg total) by mouth every 8 (eight) hours. 03/14/14   April Palumbo, MD  cetirizine-pseudoephedrine (ZYRTEC-D) 5-120 MG per tablet Take 1 tablet by mouth 2 (two) times daily. 08/16/13   Hazel Sams, PA-C  cetirizine-pseudoephedrine (ZYRTEC-D) 5-120 MG per tablet Take 1 tablet by mouth 2 (two) times daily.  03/14/14   April Palumbo, MD  cyclobenzaprine (FLEXERIL) 10 MG tablet Take 10 mg by mouth 3 (three) times daily as needed. Muscle pain     Historical Provider, MD  DM-GG-PE & APAP-Diphenhyd-PE (MUCINEX FAST-MAX DAY/NIGHT) (TABLET) MISC Take 2 tablets by mouth 2 (two) times daily as needed (for cold symptoms).    Historical Provider, MD  fluticasone (FLONASE) 50 MCG/ACT nasal spray Place 2 sprays into both nostrils daily. 03/14/14   April Palumbo, MD  guaiFENesin (MUCINEX) 600 MG 12 hr tablet Take 1 tablet (600 mg total) by mouth 2 (two) times daily. 08/16/13   Hazel Sams, PA-C  Guaifenesin 1200 MG TB12 Take 1 tablet (1,200 mg total) by mouth daily. 03/14/14   April Palumbo, MD  MOVIPREP 100 G SOLR Take 1 kit (200 g total) by mouth once. 03/10/14   Irene Shipper, MD  oxyCODONE (OXYCONTIN) 40 MG 12 hr tablet Take 40 mg by mouth every 12 (twelve) hours.    Historical Provider, MD  oxyCODONE (ROXICODONE) 15 MG immediate release tablet Take 15 mg by mouth every 6 (six) hours as needed for pain.    Historical Provider, MD   BP 149/123 mmHg  Pulse 112  Temp(Src) 98.3 F (36.8 C) (Oral)  Resp 22  Ht 6' 1.5" (1.867 m)  Wt 284 lb (128.822 kg)  BMI 36.96  kg/m2  SpO2 98% Physical Exam  Constitutional: He is oriented to person, place, and time. He appears well-developed and well-nourished. No distress.  HENT:  Head: Normocephalic and atraumatic.  Eyes: Conjunctivae and EOM are normal.  Neck: Neck supple. No tracheal deviation present.  Cardiovascular: Normal rate.   Pulmonary/Chest: Effort normal. No respiratory distress.  Musculoskeletal: Normal range of motion. He exhibits tenderness.  Thompson test showed achilles tendon intact. Mild left calf tenderness. No tenderness on the dorsum of left foot or Achilles tendon. No bony tenderness.   Neurological: He is alert and oriented to person, place, and time.  Skin: Skin is warm and dry.  Psychiatric: He has a normal mood and affect. His behavior is  normal.  Nursing note and vitals reviewed.   ED Course  Procedures (including critical care time)  DIAGNOSTIC STUDIES: Oxygen Saturation is 98% on RA, normal by my interpretation.    COORDINATION OF CARE: 11:49 PM - Discussed treatment plan with pt at bedside which includes Motrin and continuing pain medication, and pt agreed to plan.    MDM   Final diagnoses:  None  Dustin [name] is a 49 y.o. male here with calf strain. Achilles tendon is intact. No fall or trauma so will not need xrays. Likely partial gastroc tear or injury. Offered crutches but he refused. Has been walking on it. Has pain meds at home. Will dc home.      I personally performed the services described in this documentation, which was scribed in my presence. The recorded information has been reviewed and is accurate.    Wandra Arthurs, MD 08/13/14 Berniece Salines

## 2014-08-12 NOTE — ED Notes (Signed)
Patient reports he was trying to run tonight and felt a pop in the back of his ankle.  Reports swelling to ankle and foot as well as back of calf.

## 2014-08-13 NOTE — Discharge Instructions (Signed)
Take your pain meds as prescribed.   Try to stay off your leg.   Follow up with your doctor.   Return to ER if you have worse pain, unable to walk.

## 2014-08-13 NOTE — ED Notes (Signed)
Discharge instructions given and reviewed with patient.  Patient verbalized understanding to follow up with PMD, stay off leg and return to ED for worsening symptoms.  Patient ambulatory with limp; discharged home in good condition.

## 2014-08-20 ENCOUNTER — Encounter (HOSPITAL_COMMUNITY): Payer: Self-pay

## 2014-08-20 ENCOUNTER — Emergency Department (HOSPITAL_COMMUNITY)
Admission: EM | Admit: 2014-08-20 | Discharge: 2014-08-20 | Disposition: A | Discharge status: Home / Self Care | Payer: Commercial Managed Care - HMO | Attending: Emergency Medicine | Admitting: Emergency Medicine

## 2014-08-20 DIAGNOSIS — E669 Obesity, unspecified: Secondary | ICD-10-CM | POA: Insufficient documentation

## 2014-08-20 DIAGNOSIS — Z91040 Latex allergy status: Secondary | ICD-10-CM | POA: Diagnosis not present

## 2014-08-20 DIAGNOSIS — X58XXXD Exposure to other specified factors, subsequent encounter: Secondary | ICD-10-CM | POA: Insufficient documentation

## 2014-08-20 DIAGNOSIS — Z7951 Long term (current) use of inhaled steroids: Secondary | ICD-10-CM | POA: Insufficient documentation

## 2014-08-20 DIAGNOSIS — G8929 Other chronic pain: Secondary | ICD-10-CM | POA: Insufficient documentation

## 2014-08-20 DIAGNOSIS — Z79899 Other long term (current) drug therapy: Secondary | ICD-10-CM | POA: Insufficient documentation

## 2014-08-20 DIAGNOSIS — S8992XD Unspecified injury of left lower leg, subsequent encounter: Secondary | ICD-10-CM | POA: Insufficient documentation

## 2014-08-20 DIAGNOSIS — I10 Essential (primary) hypertension: Secondary | ICD-10-CM | POA: Diagnosis not present

## 2014-08-20 NOTE — ED Notes (Signed)
Pt presents with c/o left leg injury that occurred approx 5 days ago. Pt reports he was seen at [facility] after the injury and diagnosed with some torn ligaments but he reports that the pain has not gotten any better over the last few days. Ambulatory to triage.

## 2014-08-20 NOTE — Discharge Instructions (Signed)
You have injured your left leg, with potential calf muscle injury.  Please follow up closely with orthopedist for further management.  Follow instruction below.    Medial Head Gastrocnemius Tear (Tennis Leg), with Rehab Medial head gastrocnemius tear, also called tennis leg, is a tear (strain) in a muscle or tendon of the inner portion (medial head) of one of the calf muscles (gastrocnemius). The inner portion of the calf muscle attaches to the thigh bone (femur) and is responsible for bending the knee and straightening the foot (standing "on tiptoe"). Strains are classified into three categories. Grade 1 strains cause pain, but the tendon is not lengthened. Grade 2 strains include a lengthened ligament, due to the ligament being stretched or partially ruptured. With grade 2 strains there is still function, although function may be decreased. Grade 3 strains involve a complete tear of the tendon or muscle, and function is usually impaired. SYMPTOMS   Sudden "pop" or tear felt at the time of injury.  Pain, tenderness, swelling, warmth, or redness over the middle inner calf.  Pain and weakness with ankle motion, especially flexing the ankle against resistance, as well as pain with lifting up the foot (extending the ankle).  Bruising (contusion) of the calf, heel, and sometimes the foot within 48 hours of injury.  Muscle spasm in the calf. CAUSES  Muscle and ligament strains occur when a force is placed on the muscle or ligament that is greater than it can handle. Common causes of injury include:  Direct hit (trauma) to the calf.  Sudden forceful pushing off or landing on the foot (jumping, landing, serving a tennis ball, lunging). RISK INCREASES WITH:  Sports that require sudden, explosive calf muscle contraction, such as those involving jumping (basketball), hill running, quick starts (running), or lunging (racquetball, tennis).  Contact sports (football, soccer, hockey).  Poor strength and  flexibility.  Previous lower limb injury. PREVENTION  Warm up and stretch properly before activity.  Allow for adequate recovery between workouts.  Maintain physical fitness:  Strength, flexibility, and endurance.  Cardiovascular fitness.  Learn and use proper exercise technique.  Complete rehabilitation after lower limb injury, before returning to competition or practice. PROGNOSIS  If treated properly, tennis leg usually heals within 6 weeks of nonsurgical treatment.  RELATED COMPLICATIONS   Longer healing time, if not properly treated or if not given enough time to heal.  Recurring symptoms and injury, if activity is resumed too soon, with overuse, with a direct blow, or with poor technique.  If untreated, may progress to a complete tear (rare) or other injury, due to limping and favoring of the injured leg.  Persistent limping, due to scarring and shortening of the calf muscles, as a result of inadequate rehabilitation.  Prolonged disability. TREATMENT  Treatment first involves the use of ice and medication to help reduce pain and inflammation. The use of strengthening and stretching exercises may help reduce pain with activity. These exercises may be performed at home or with a therapist. For severe injuries, referral to a therapist may be needed for further evaluation and treatment. Your caregiver may advise that you wear a brace to help healing. Sometimes, crutches are needed until you can walk without limping. Rarely, surgery is needed.  MEDICATION   If pain medicine is needed, nonsteroidal anti-inflammatory medicines (aspirin and ibuprofen), or other minor pain relievers (acetaminophen), are often advised.  Do not take pain medicine for 7 days before surgery.  Prescription pain relievers may be given, if your caregiver thinks  they are needed. Use only as directed and only as much as you need. HEAT AND COLD  Cold treatment (icing) should be applied for 10 to 15  minutes every 2 to 3 hours for inflammation and pain, and immediately after activity that aggravates your symptoms. Use ice packs or an ice massage.  Heat treatment may be used before performing stretching and strengthening activities prescribed by your caregiver, physical therapist, or athletic trainer. Use a heat pack or a warm water soak. SEEK MEDICAL CARE IF:   Symptoms get worse or do not improve in 2 weeks, despite treatment.  Numbness or tingling develops.  New, unexplained symptoms develop. (Drugs used in treatment may produce side effects.) EXERCISES  RANGE OF MOTION (ROM) AND STRETCHING EXERCISES - Medial Head Gastrocnemius Tear (Tennis Leg) These exercises may help you when beginning to rehabilitate your injury. Your symptoms may resolve with or without further involvement from your physician, physical therapist, or athletic trainer. While completing these exercises, remember:   Restoring tissue flexibility helps normal motion to return to the joints. This allows healthier, less painful movement and activity.  An effective stretch should be held for at least 30 seconds.  A stretch should never be painful. You should only feel a gentle lengthening or release in the stretched tissue. STRETCH - Gastrocsoleus  Sit with your right / left leg extended. Holding onto both ends of a belt or towel, loop it around the ball of your foot.  Keeping your right / left ankle and foot relaxed and your knee straight, pull your foot and ankle toward you using the belt.  You should feel a gentle stretch behind your calf or knee. Hold this position for __________ seconds. Repeat __________ times. Complete this stretch __________ times per day.  RANGE OF MOTION - Ankle Dorsiflexion, Active Assisted   Remove your shoes and sit on a chair, preferably not on a carpeted surface.  Place your right / left foot directly under the knee. Extend your opposite leg for support.  Keeping your heel down,  slide your right / left foot back toward the chair, until you feel a stretch at your ankle or calf. If you do not feel a stretch, slide your bottom forward to the edge of the chair, while still keeping your heel down.  Hold this stretch for __________ seconds. Repeat __________ times. Complete this stretch __________ times per day.  STRETCH - Gastroc, Standing   Place your hands on a wall.  Extend your right / left leg behind you, keeping the front knee somewhat bent.  Slightly point your toes inward on your back foot.  Keeping your right / left heel on the floor and your knee straight, shift your weight toward the wall, not allowing your back to arch.  You should feel a gentle stretch in the right / left calf. Hold this position for __________ seconds. Repeat __________ times. Complete this stretch __________ times per day. STRETCH - Soleus, Standing   Place your hands on a wall.  Extend your right / left leg behind you, keeping the other knee somewhat bent.  Point your toes of your back foot slightly inward.  Keep your right / left heel on the floor, bend your back knee, and slightly shift your weight over the back leg so that you feel a gentle stretch deep in your back calf.  Hold this position for __________ seconds. Repeat __________ times. Complete this stretch __________ times per day. STRETCH - Gastrocsoleus, Standing Note: This exercise  can place a lot of stress on your foot and ankle. Please complete this exercise only if specifically instructed by your caregiver.   Place the ball of your right / left foot on a step, keeping your other foot firmly on the same step.  Hold on to the wall or a rail for balance.  Slowly lift your other foot, allowing your body weight to press your heel down over the edge of the step.  You should feel a stretch in your right / left calf.  Hold this position for __________ seconds.  Repeat this exercise with a slight bend in your right /  left knee. Repeat __________ times. Complete this stretch __________ times per day.  STRENGTHENING EXERCISES - Medial Head Gastrocnemius Tear (Tennis Leg) These exercises may help you when beginning to rehabilitate your injury. They may resolve your symptoms with or without further involvement from your physician, physical therapist, or athletic trainer. While completing these exercises, remember:   Muscles can gain both the endurance and the strength needed for everyday activities through controlled exercises.  Complete these exercises as instructed by your physician, physical therapist, or athletic trainer. Increase the resistance and repetitions only as guided by your caregiver. STRENGTH - Plantar-flexors  Sit with your right / left leg extended. Holding onto both ends of a rubber exercise band or tubing, loop it around the ball of your foot. Keep a slight tension in the band.  Slowly push your toes away from you, pointing them downward.  Hold this position for __________ seconds. Return slowly, controlling the tension in the band. Repeat __________ times. Complete this exercise __________ times per day.  STRENGTH - Plantar-flexors  Stand with your feet shoulder width apart. Steady yourself with a wall or table, using as little support as needed.  Keeping your weight evenly spread over the width of your feet, rise up on your toes.*  Hold this position for __________ seconds. Repeat __________ times. Complete this exercise __________ times per day.  *If this is too easy, shift your weight toward your right / left leg until you feel challenged. Ultimately, you may be asked to do this exercise while standing on your right / left foot only. STRENGTH - Plantar-flexors, Eccentric Note: This exercise can place a lot of stress on your foot and ankle. Please complete this exercise only if specifically instructed by your caregiver.   Place the balls of your feet on a step. With your hands, use  only enough support from a wall or rail to keep your balance.  Keep your knees straight and rise up on your toes.  Slowly shift your weight entirely to your right / left toes and pick up your opposite foot. Gently and with controlled movement, lower your weight through your right / left foot so that your heel drops below the level of the step. You will feel a slight stretch in the back of your right / left calf.  Use the healthy leg to help rise up onto the balls of both feet, then lower weight only onto the right / left leg again. Build up to 15 repetitions. Then progress to 3 sets of 15 repetitions.*  After completing the above exercise, complete the same exercise with a slight knee bend (about 30 degrees). Again, build up to 15 repetitions. Then progress to 3 sets of 15 repetitions.* Perform this exercise __________ times per day.  *When you easily complete 3 sets of 15, your physician, physical therapist, or athletic trainer may advise  you to add resistance, by wearing a backpack filled with additional weight. Document Released: 02/05/2005 Document Revised: 06/22/2013 Document Reviewed: 05/20/2008 [facility] Patient Information 2015 [place], [place]. This information is not intended to replace advice given to you by your health care provider. Make sure you discuss any questions you have with your health care provider.

## 2014-08-20 NOTE — ED Provider Notes (Signed)
CSN: [id number]     Arrival date & time 08/20/14  1534 History   This chart was scribed for non-physician practitioner, Domenic Moras, PA-C, working with Tanna Furry, MD by Terressa Koyanagi, ED Scribe. This patient was seen in room [place] and the patient's care was started at 4:14 PM.   Chief Complaint  Patient presents with  . Leg Injury   HPI PCP: Philis Fendt, MD HPI Comments: Dustin [name] is a 49 y.o. male, with PMH noted below, who presents to the Emergency Department complaining of traumatic, sudden onset, waxing and waning, 5/10, aching left leg pain with associated worsening swelling of LLE onset 5 days due to an injury. Pt states 5 days ago he was pivoting to run/play with his grandchildren when he felt a pop in the back of his left lower leg resulting in current Sx. Pt reports doing RICE at home some relief. Patient takes oxycodone for his chronic neck and shoulder pain, which he has continued to take since the onset of his present Sx, with some relief. Pt denies any chest pain, SOB, coughing up blood, Hx of cancer, Hx of blood clots. Pt reports he was treated at the ED in [facility] immediately following the injury.   Past Medical History  Diagnosis Date  . Hypertension   . Obesity   . Chronic back pain    Past Surgical History  Procedure Laterality Date  . Cervical fusion    . Shoulder surgery     Family History  Problem Relation Age of Onset  . Colon cancer Neg Hx    History  Substance Use Topics  . Smoking status: Never Smoker   . Smokeless tobacco: Never Used  . Alcohol Use: Yes     Comment: occasional, special events like a wedding    Review of Systems  Constitutional: Negative for fever and chills.  Respiratory: Negative for shortness of breath.   Cardiovascular: Positive for leg swelling (due to injury). Negative for chest pain.  Musculoskeletal: Positive for myalgias (Left leg pain) and arthralgias (left ankle pain ).      Allergies  Latex  Home  Medications   Prior to Admission medications   Medication Sig Start Date End Date Taking? Authorizing Provider  Ascorbic Acid (VITAMIN C PO) Take 1 tablet by mouth daily.    Historical Provider, MD  benzonatate (TESSALON) 100 MG capsule Take 1 capsule (100 mg total) by mouth every 8 (eight) hours. 03/14/14   April Palumbo, MD  cetirizine-pseudoephedrine (ZYRTEC-D) 5-120 MG per tablet Take 1 tablet by mouth 2 (two) times daily. 08/16/13   Hazel Sams, PA-C  cetirizine-pseudoephedrine (ZYRTEC-D) 5-120 MG per tablet Take 1 tablet by mouth 2 (two) times daily. 03/14/14   April Palumbo, MD  cyclobenzaprine (FLEXERIL) 10 MG tablet Take 10 mg by mouth 3 (three) times daily as needed. Muscle pain     Historical Provider, MD  DM-GG-PE & APAP-Diphenhyd-PE (MUCINEX FAST-MAX DAY/NIGHT) (TABLET) MISC Take 2 tablets by mouth 2 (two) times daily as needed (for cold symptoms).    Historical Provider, MD  fluticasone (FLONASE) 50 MCG/ACT nasal spray Place 2 sprays into both nostrils daily. 03/14/14   April Palumbo, MD  guaiFENesin (MUCINEX) 600 MG 12 hr tablet Take 1 tablet (600 mg total) by mouth 2 (two) times daily. 08/16/13   Hazel Sams, PA-C  Guaifenesin 1200 MG TB12 Take 1 tablet (1,200 mg total) by mouth daily. 03/14/14   April Palumbo, MD  MOVIPREP 100 G SOLR Take 1 kit (200  g total) by mouth once. 03/10/14   Irene Shipper, MD  oxyCODONE (OXYCONTIN) 40 MG 12 hr tablet Take 40 mg by mouth every 12 (twelve) hours.    Historical Provider, MD  oxyCODONE (ROXICODONE) 15 MG immediate release tablet Take 15 mg by mouth every 6 (six) hours as needed for pain.    Historical Provider, MD   Triage Vitals: BP 165/107 mmHg  Pulse 111  Temp(Src) 97.9 F (36.6 C) (Oral)  Resp 22  SpO2 98% Physical Exam  Constitutional: He is oriented to person, place, and time. He appears well-developed and well-nourished. No distress.  HENT:  Head: Normocephalic and atraumatic.  Eyes: Conjunctivae and EOM are normal.  Cardiovascular:  Normal rate.   Pulmonary/Chest: Effort normal. No respiratory distress.  Musculoskeletal: Normal range of motion.       Left upper leg: He exhibits tenderness. He exhibits no bony tenderness.       Right lower leg: Normal.       Left lower leg: He exhibits tenderness and edema. He exhibits no bony tenderness and no deformity.       Left foot: There is tenderness and swelling. There is normal range of motion, no bony tenderness, normal capillary refill and no deformity.  Significant edema noted to LLE including dorsum of left foot. Brisk cap refill of all toes. Left pedal pulse palpable. Pain along the gastrocnemius muscle, distal thigh, and left distal posterior thigh. Normal dorsal and plantar flexion. Normal thompson test. Bruising noted to left distal posterior thigh. Sensation intact.   Neurological: He is alert and oriented to person, place, and time.  Skin: Skin is warm and dry.  Psychiatric: He has a normal mood and affect. His behavior is normal.  Nursing note and vitals reviewed.   ED Course  Procedures (including critical care time) DIAGNOSTIC STUDIES: Oxygen Saturation is 98% on RA, nl by my interpretation.    COORDINATION OF CARE: 4:17 PM-patient with marked swelling to his left lower extremities after his injury. although compartment is soft, he does have ecchymosis to the distal thigh dorsally and tenderness to gastrocnemius muscle.  Suspect calf muscle tear but cannot rule out hamstring injury.   Discussed treatment plan including RICE, cam walker, meds with pt at bedside and pt agreed to plan. Pt will f/u closely with orthopedist for further management.  Doubt DVT when consider his pain started immediately after injury.   Labs Review Labs Reviewed - No data to display  Imaging Review No results found.   EKG Interpretation None      MDM   Final diagnoses:  Left leg injury, subsequent encounter    BP 165/107 mmHg  Pulse 111  Temp(Src) 97.9 F (36.6 C) (Oral)   Resp 22  SpO2 98% Tachycardia 2/2 pain  I personally performed the services described in this documentation, which was scribed in my presence. The recorded information has been reviewed and is accurate.     Domenic Moras, PA-C 08/20/14 [place], MD 08/23/14 2056

## 2014-12-11 ENCOUNTER — Encounter (HOSPITAL_COMMUNITY): Payer: Self-pay | Admitting: Emergency Medicine

## 2014-12-11 ENCOUNTER — Emergency Department (HOSPITAL_COMMUNITY)
Admission: EM | Admit: 2014-12-11 | Discharge: 2014-12-11 | Disposition: A | Discharge status: Home / Self Care | Payer: Commercial Managed Care - HMO | Attending: Emergency Medicine | Admitting: Emergency Medicine

## 2014-12-11 DIAGNOSIS — I10 Essential (primary) hypertension: Secondary | ICD-10-CM | POA: Diagnosis not present

## 2014-12-11 DIAGNOSIS — R05 Cough: Secondary | ICD-10-CM | POA: Diagnosis present

## 2014-12-11 DIAGNOSIS — G8929 Other chronic pain: Secondary | ICD-10-CM | POA: Insufficient documentation

## 2014-12-11 DIAGNOSIS — Z79899 Other long term (current) drug therapy: Secondary | ICD-10-CM | POA: Diagnosis not present

## 2014-12-11 DIAGNOSIS — Z91040 Latex allergy status: Secondary | ICD-10-CM | POA: Diagnosis not present

## 2014-12-11 DIAGNOSIS — E669 Obesity, unspecified: Secondary | ICD-10-CM | POA: Insufficient documentation

## 2014-12-11 DIAGNOSIS — J069 Acute upper respiratory infection, unspecified: Secondary | ICD-10-CM | POA: Insufficient documentation

## 2014-12-11 MED ORDER — FLUTICASONE PROPIONATE 50 MCG/ACT NA SUSP: 2.0000 | Freq: Every day | NASAL | Status: DC

## 2014-12-11 MED ORDER — CETIRIZINE-PSEUDOEPHEDRINE ER 5-120 MG PO TB12: 1.0000 | ORAL_TABLET | Freq: Two times a day (BID) | ORAL | Status: DC

## 2014-12-11 MED ORDER — BENZONATATE 100 MG PO CAPS: 200.0000 mg | ORAL_CAPSULE | Freq: Two times a day (BID) | ORAL | Status: DC | PRN

## 2014-12-11 NOTE — ED Provider Notes (Signed)
CSN: [id number]     Arrival date & time 12/11/14  8341 History   First MD Initiated Contact with Patient 12/11/14 [phone]     Chief Complaint  Patient presents with  . URI     (Consider location/radiation/quality/duration/timing/severity/associated sxs/prior Treatment) HPI Comments: Patient is a 49 year old male who presents to the ED with complaint of URI symptoms, onset 1 week. Patient reports he has been having a productive cough, rhinorrhea, nasal congestion and watery eyes. Denies fever, chills, headache, ear pain, sore throat, wheezing, SOB, CP, abdominal pain, nausea, vomiting. He states he has not tried taking anything at home. Patient notes that he has had the symptoms this time of year for the past few years and has been treated for the same thing in the ED. He notes that he has been around his granddaughter for the past week who goes to daycare and has also had similar symptoms.  Patient is a 49 y.o. male presenting with URI.  URI Presenting symptoms: congestion, cough and rhinorrhea     Past Medical History  Diagnosis Date  . Hypertension   . Obesity   . Chronic back pain    Past Surgical History  Procedure Laterality Date  . Cervical fusion    . Shoulder surgery     Family History  Problem Relation Age of Onset  . Colon cancer Neg Hx    Social History  Substance Use Topics  . Smoking status: Never Smoker   . Smokeless tobacco: Never Used  . Alcohol Use: Yes     Comment: occasional, special events like a wedding    Review of Systems  HENT: Positive for congestion and rhinorrhea.   Eyes:       Watery eyes  Respiratory: Positive for cough.   All other systems reviewed and are negative.     Allergies  Latex  Home Medications   Prior to Admission medications   Medication Sig Start Date End Date Taking? Authorizing Provider  Ascorbic Acid (VITAMIN C PO) Take 1 tablet by mouth daily.    Historical Provider, MD  benzonatate (TESSALON) 100 MG capsule Take 2  capsules (200 mg total) by mouth 2 (two) times daily as needed for cough. 12/11/14   Nona Dell, PA-C  cetirizine-pseudoephedrine (ZYRTEC-D) 5-120 MG tablet Take 1 tablet by mouth 2 (two) times daily. 12/11/14   Nona Dell, PA-C  cyclobenzaprine (FLEXERIL) 10 MG tablet Take 10 mg by mouth 3 (three) times daily as needed. Muscle pain     Historical Provider, MD  DM-GG-PE & APAP-Diphenhyd-PE (MUCINEX FAST-MAX DAY/NIGHT) (TABLET) MISC Take 2 tablets by mouth 2 (two) times daily as needed (for cold symptoms).    Historical Provider, MD  fluticasone (FLONASE) 50 MCG/ACT nasal spray Place 2 sprays into both nostrils daily. 12/11/14   Nona Dell, PA-C  guaiFENesin (MUCINEX) 600 MG 12 hr tablet Take 1 tablet (600 mg total) by mouth 2 (two) times daily. 08/16/13   Hazel Sams, PA-C  Guaifenesin 1200 MG TB12 Take 1 tablet (1,200 mg total) by mouth daily. 03/14/14   April Palumbo, MD  MOVIPREP 100 G SOLR Take 1 kit (200 g total) by mouth once. 03/10/14   Irene Shipper, MD  oxyCODONE (OXYCONTIN) 40 MG 12 hr tablet Take 40 mg by mouth every 12 (twelve) hours.    Historical Provider, MD  oxyCODONE (ROXICODONE) 15 MG immediate release tablet Take 15 mg by mouth every 6 (six) hours as needed for pain.    Historical Provider,  MD   BP 153/106 mmHg  Pulse 87  Temp(Src) 97.9 F (36.6 C) (Oral)  Resp 13  SpO2 98% Physical Exam  Constitutional: He is oriented to person, place, and time. He appears well-developed and well-nourished. No distress.  HENT:  Head: Normocephalic and atraumatic.  Right Ear: Tympanic membrane normal.  Left Ear: Tympanic membrane normal.  Nose: Rhinorrhea present. Right sinus exhibits no maxillary sinus tenderness and no frontal sinus tenderness. Left sinus exhibits no maxillary sinus tenderness and no frontal sinus tenderness.  Mouth/Throat: Uvula is midline, oropharynx is clear and moist and mucous membranes are normal. No oropharyngeal exudate.  Eyes:  Conjunctivae and EOM are normal. Pupils are equal, round, and reactive to light. Right eye exhibits no discharge. Left eye exhibits no discharge. No scleral icterus.  Neck: Normal range of motion. Neck supple.  Cardiovascular: Normal rate, regular rhythm, normal heart sounds and intact distal pulses.   No murmur heard. Pulmonary/Chest: Effort normal and breath sounds normal. No respiratory distress. He has no wheezes. He has no rales. He exhibits no tenderness.  Abdominal: He exhibits no distension.  Musculoskeletal: Normal range of motion.  Lymphadenopathy:    He has no cervical adenopathy.  Neurological: He is alert and oriented to person, place, and time.  Skin: Skin is warm and dry.  Nursing note and vitals reviewed.   ED Course  Procedures (including critical care time) Labs Review Labs Reviewed - No data to display  Imaging Review No results found. I have personally reviewed and evaluated these images and lab results as part of my medical decision-making.  Filed Vitals:   12/11/14 0853  BP: 153/106  Pulse: 87  Temp: 97.9 F (36.6 C)  Resp: 13     MDM   Final diagnoses:  URI (upper respiratory infection)    Patient presents with productive cough, rhinorrhea, nasal congestion, watery eyes. He reports having similar symptoms this time of year for the past few years. Patient states his symptoms are consistent with URI has had in the past. Denies fever, SOB, wheezing. VSS. Exam reveals rhinorrhea, lungs CTAB, remaining exam unremarkable. I suspect patient's symptoms are likely due to viral URI + allergies. I do not suspect pneumonia at this time and do not feel that any further workup or imaging is warranted. Plan to discharge patient home. Patient given prescriptions for zyrtec-d, tessalon and flonase. Advised patient to follow up with his PCP in a week.  Evaluation does not show pathology requring ongoing emergent intervention or admission. Pt is hemodynamically stable and  mentating appropriately. Discussed findings/results and plan with patient/guardian, who agrees with plan. All questions answered. Return precautions discussed and outpatient follow up given.     Chesley Noon [place], [place] 12/11/14 3704  Charlesetta Shanks, MD 12/20/14 [phone]

## 2014-12-11 NOTE — Discharge Instructions (Signed)
Take your medications as prescribed. Follow-up with your primary care provider in the next week. Return to the emergency department if symptoms worsen or new onset of fever, chills, difficulty breathing, wheezing, chest pain, vomiting.

## 2014-12-11 NOTE — ED Notes (Signed)
Patient c/o productive cough with green sputum, green nasal drainage. Pt states that he has been treated for the same thing in the ED in the past and the medications worked well, pt would like same treatment if possible.

## 2015-04-01 ENCOUNTER — Inpatient Hospital Stay (HOSPITAL_COMMUNITY)
Admission: EM | Admit: 2015-04-01 | Discharge: 2015-04-03 | DRG: 481 | Disposition: A | Discharge status: Home Health | Payer: Commercial Managed Care - HMO | Attending: Orthopedic Surgery | Admitting: Orthopedic Surgery

## 2015-04-01 ENCOUNTER — Inpatient Hospital Stay (HOSPITAL_COMMUNITY): Payer: Commercial Managed Care - HMO | Admitting: Anesthesiology

## 2015-04-01 ENCOUNTER — Encounter (HOSPITAL_COMMUNITY): Payer: Self-pay

## 2015-04-01 ENCOUNTER — Emergency Department (HOSPITAL_COMMUNITY): Payer: Commercial Managed Care - HMO

## 2015-04-01 ENCOUNTER — Encounter (HOSPITAL_COMMUNITY)
Admission: EM | Disposition: A | Discharge status: Home Health | Payer: Self-pay | Source: Home / Self Care | Attending: Orthopedic Surgery

## 2015-04-01 DIAGNOSIS — S61205A Unspecified open wound of left ring finger without damage to nail, initial encounter: Secondary | ICD-10-CM | POA: Diagnosis present

## 2015-04-01 DIAGNOSIS — I10 Essential (primary) hypertension: Secondary | ICD-10-CM | POA: Diagnosis present

## 2015-04-01 DIAGNOSIS — S61519A Laceration without foreign body of unspecified wrist, initial encounter: Secondary | ICD-10-CM | POA: Diagnosis present

## 2015-04-01 DIAGNOSIS — Y906 Blood alcohol level of 120-199 mg/100 ml: Secondary | ICD-10-CM | POA: Diagnosis present

## 2015-04-01 DIAGNOSIS — S61219A Laceration without foreign body of unspecified finger without damage to nail, initial encounter: Secondary | ICD-10-CM | POA: Diagnosis present

## 2015-04-01 DIAGNOSIS — S61207A Unspecified open wound of left little finger without damage to nail, initial encounter: Secondary | ICD-10-CM | POA: Diagnosis present

## 2015-04-01 DIAGNOSIS — Z8781 Personal history of (healed) traumatic fracture: Secondary | ICD-10-CM

## 2015-04-01 DIAGNOSIS — S62102A Fracture of unspecified carpal bone, left wrist, initial encounter for closed fracture: Secondary | ICD-10-CM | POA: Diagnosis present

## 2015-04-01 DIAGNOSIS — S72401A Unspecified fracture of lower end of right femur, initial encounter for closed fracture: Secondary | ICD-10-CM | POA: Diagnosis present

## 2015-04-01 DIAGNOSIS — F10929 Alcohol use, unspecified with intoxication, unspecified: Secondary | ICD-10-CM

## 2015-04-01 DIAGNOSIS — S7291XA Unspecified fracture of right femur, initial encounter for closed fracture: Secondary | ICD-10-CM | POA: Diagnosis present

## 2015-04-01 DIAGNOSIS — S52502A Unspecified fracture of the lower end of left radius, initial encounter for closed fracture: Secondary | ICD-10-CM | POA: Diagnosis present

## 2015-04-01 DIAGNOSIS — F10129 Alcohol abuse with intoxication, unspecified: Secondary | ICD-10-CM | POA: Diagnosis present

## 2015-04-01 DIAGNOSIS — S8991XA Unspecified injury of right lower leg, initial encounter: Secondary | ICD-10-CM | POA: Diagnosis present

## 2015-04-01 DIAGNOSIS — S61412A Laceration without foreign body of left hand, initial encounter: Secondary | ICD-10-CM

## 2015-04-01 DIAGNOSIS — Z9889 Other specified postprocedural states: Secondary | ICD-10-CM

## 2015-04-01 DIAGNOSIS — T148XXA Other injury of unspecified body region, initial encounter: Secondary | ICD-10-CM

## 2015-04-01 DIAGNOSIS — S72341A Displaced spiral fracture of shaft of right femur, initial encounter for closed fracture: Secondary | ICD-10-CM

## 2015-04-01 HISTORY — PX: FEMUR IM NAIL: SHX1597

## 2015-04-01 HISTORY — PX: CLOSED REDUCTION WRIST FRACTURE: SHX1091

## 2015-04-01 LAB — SAMPLE TO BLOOD BANK

## 2015-04-01 LAB — COMPREHENSIVE METABOLIC PANEL
ALBUMIN: 3.5 g/dL (ref 3.5–5.0)
ALK PHOS: 73 U/L (ref 38–126)
ALT: 26 U/L (ref 17–63)
AST: 26 U/L (ref 15–41)
Anion gap: 18 — ABNORMAL HIGH (ref 5–15)
BILIRUBIN TOTAL: 0.6 mg/dL (ref 0.3–1.2)
BUN: 11 mg/dL (ref 6–20)
CALCIUM: 8.6 mg/dL — AB (ref 8.9–10.3)
CO2: 17 mmol/L — AB (ref 22–32)
CREATININE: 1.29 mg/dL — AB (ref 0.61–1.24)
Chloride: 104 mmol/L (ref 101–111)
GFR calc Af Amer: >60 mL/min (ref >60)
GFR calc non Af Amer: >60 mL/min (ref >60)
GLUCOSE: 193 mg/dL — AB (ref 65–99)
Potassium: 3.2 mmol/L — ABNORMAL LOW (ref 3.5–5.1)
SODIUM: 139 mmol/L (ref 135–145)
TOTAL PROTEIN: 6.2 g/dL — AB (ref 6.5–8.1)

## 2015-04-01 LAB — PROTIME-INR
INR: 1.04 (ref 0.00–1.49)
PROTHROMBIN TIME: 13.8 s (ref 11.6–15.2)

## 2015-04-01 LAB — CBC
HCT: 43.1 % (ref 39.0–52.0)
Hemoglobin: 14.1 g/dL (ref 13.0–17.0)
MCH: 29.2 pg (ref 26.0–34.0)
MCHC: 32.7 g/dL (ref 30.0–36.0)
MCV: 89.2 fL (ref 78.0–100.0)
PLATELETS: 267 10*3/uL (ref 150–400)
RBC: 4.83 MIL/uL (ref 4.22–5.81)
RDW: 13.4 % (ref 11.5–15.5)
WBC: 14.8 10*3/uL — ABNORMAL HIGH (ref 4.0–10.5)

## 2015-04-01 LAB — ETHANOL: ALCOHOL ETHYL (B): 154 mg/dL — AB (ref <5)

## 2015-04-01 LAB — CDS SEROLOGY

## 2015-04-01 SURGERY — INSERTION, INTRAMEDULLARY ROD, FEMUR, RETROGRADE
Anesthesia: General | Site: Wrist | Laterality: Right

## 2015-04-01 MED ORDER — FENTANYL CITRATE (PF) 250 MCG/5ML IJ SOLN
INTRAMUSCULAR | Status: AC
  Filled 2015-04-01: qty 5

## 2015-04-01 MED ORDER — ONDANSETRON HCL 4 MG/2ML IJ SOLN
INTRAMUSCULAR | Status: AC
  Filled 2015-04-01: qty 2

## 2015-04-01 MED ORDER — PROPOFOL 10 MG/ML IV BOLUS
INTRAVENOUS | Status: DC | PRN
  Administered 2015-04-01: 200 mg via INTRAVENOUS

## 2015-04-01 MED ORDER — SODIUM CHLORIDE 0.9 % IV BOLUS (SEPSIS): 1000.0000 mL | Freq: Once | INTRAVENOUS | Status: DC

## 2015-04-01 MED ORDER — OXYCODONE HCL 5 MG PO TABS
15.0000 mg | ORAL_TABLET | Freq: Once | ORAL | Status: AC
  Administered 2015-04-01: 15 mg via ORAL
  Filled 2015-04-01: qty 3

## 2015-04-01 MED ORDER — HYDROMORPHONE HCL 1 MG/ML IJ SOLN
INTRAMUSCULAR | Status: DC | PRN
  Administered 2015-04-01: 1 mg via INTRAVENOUS
  Administered 2015-04-01: 0.5 mg via INTRAVENOUS
  Administered 2015-04-01: .5 mg via INTRAVENOUS
  Administered 2015-04-02 (×2): 0.5 mg via INTRAVENOUS

## 2015-04-01 MED ORDER — SUCCINYLCHOLINE CHLORIDE 20 MG/ML IJ SOLN
INTRAMUSCULAR | Status: AC
  Filled 2015-04-01: qty 1

## 2015-04-01 MED ORDER — PHENYLEPHRINE HCL 10 MG/ML IJ SOLN
INTRAMUSCULAR | Status: DC | PRN
  Administered 2015-04-01 (×2): 40 ug via INTRAVENOUS

## 2015-04-01 MED ORDER — SUCCINYLCHOLINE CHLORIDE 20 MG/ML IJ SOLN
INTRAMUSCULAR | Status: DC | PRN
  Administered 2015-04-01: 80 mg via INTRAVENOUS

## 2015-04-01 MED ORDER — ONDANSETRON HCL 4 MG/2ML IJ SOLN: 4.0000 mg | Freq: Once | INTRAMUSCULAR | Status: DC

## 2015-04-01 MED ORDER — ROCURONIUM BROMIDE 100 MG/10ML IV SOLN
INTRAVENOUS | Status: DC | PRN
  Administered 2015-04-01: 50 mg via INTRAVENOUS

## 2015-04-01 MED ORDER — HYDROMORPHONE HCL 1 MG/ML IJ SOLN
INTRAMUSCULAR | Status: AC
  Filled 2015-04-01: qty 1

## 2015-04-01 MED ORDER — MIDAZOLAM HCL 2 MG/2ML IJ SOLN
INTRAMUSCULAR | Status: AC
  Filled 2015-04-01: qty 2

## 2015-04-01 MED ORDER — LACTATED RINGERS IV SOLN
INTRAVENOUS | Status: DC | PRN
  Administered 2015-04-01 – 2015-04-02 (×2): via INTRAVENOUS

## 2015-04-01 MED ORDER — SODIUM CHLORIDE 0.9 % IV BOLUS (SEPSIS)
1000.0000 mL | Freq: Once | INTRAVENOUS | Status: AC
  Administered 2015-04-01: 1000 mL via INTRAVENOUS

## 2015-04-01 MED ORDER — LIDOCAINE HCL (CARDIAC) 20 MG/ML IV SOLN
INTRAVENOUS | Status: DC | PRN
  Administered 2015-04-01: 80 mg via INTRAVENOUS

## 2015-04-01 MED ORDER — OXYCODONE HCL 5 MG PO TABS
20.0000 mg | ORAL_TABLET | Freq: Once | ORAL | Status: DC
Start: 2015-04-01 — End: 2015-04-01

## 2015-04-01 MED ORDER — FENTANYL CITRATE (PF) 100 MCG/2ML IJ SOLN
100.0000 ug | Freq: Once | INTRAMUSCULAR | Status: DC
  Filled 2015-04-01: qty 2

## 2015-04-01 MED ORDER — 0.9 % SODIUM CHLORIDE (POUR BTL) OPTIME
TOPICAL | Status: DC | PRN
  Administered 2015-04-01: 1000 mL

## 2015-04-01 MED ORDER — CEFAZOLIN SODIUM-DEXTROSE 2-3 GM-% IV SOLR
INTRAVENOUS | Status: DC | PRN
  Administered 2015-04-01: 2 g via INTRAVENOUS

## 2015-04-01 MED ORDER — FENTANYL CITRATE (PF) 100 MCG/2ML IJ SOLN
50.0000 ug | Freq: Once | INTRAMUSCULAR | Status: DC
  Filled 2015-04-01: qty 2

## 2015-04-01 MED ORDER — TETANUS-DIPHTH-ACELL PERTUSSIS 5-2.5-18.5 LF-MCG/0.5 IM SUSP
0.5000 mL | Freq: Once | INTRAMUSCULAR | Status: DC
  Filled 2015-04-01: qty 0.5

## 2015-04-01 SURGICAL SUPPLY — 76 items
BANDAGE ACE 6X5 VEL STRL LF (GAUZE/BANDAGES/DRESSINGS) ×4 IMPLANT
BANDAGE ELASTIC 4 VELCRO ST LF (GAUZE/BANDAGES/DRESSINGS) ×4 IMPLANT
BANDAGE ELASTIC 6 VELCRO ST LF (GAUZE/BANDAGES/DRESSINGS) IMPLANT
BANDAGE ESMARK 6X9 LF (GAUZE/BANDAGES/DRESSINGS) ×4 IMPLANT
BIT DRILL CALIBRATED 4.3MMX365 (DRILL) ×2 IMPLANT
BIT DRILL CROWE POINT TWST 4.3 (DRILL) ×2 IMPLANT
BLADE SURG 15 STRL LF DISP TIS (BLADE) ×4 IMPLANT
BLADE SURG 15 STRL SS (BLADE) ×4
BLADE SURG ROTATE 9660 (MISCELLANEOUS) IMPLANT
BNDG COHESIVE 1X5 TAN STRL LF (GAUZE/BANDAGES/DRESSINGS) ×4 IMPLANT
BNDG COHESIVE 4X5 TAN STRL (GAUZE/BANDAGES/DRESSINGS) ×4 IMPLANT
BNDG COHESIVE 6X5 TAN STRL LF (GAUZE/BANDAGES/DRESSINGS) ×4 IMPLANT
BNDG CONFORM 2 STRL LF (GAUZE/BANDAGES/DRESSINGS) ×4 IMPLANT
BNDG ESMARK 6X9 LF (GAUZE/BANDAGES/DRESSINGS) ×8
BNDG GAUZE ELAST 4 BULKY (GAUZE/BANDAGES/DRESSINGS) ×4 IMPLANT
CUFF TOURNIQUET SINGLE 34IN LL (TOURNIQUET CUFF) ×4 IMPLANT
CUFF TOURNIQUET SINGLE 44IN (TOURNIQUET CUFF) IMPLANT
DRAPE C-ARM 42X72 X-RAY (DRAPES) ×4 IMPLANT
DRAPE C-ARMOR (DRAPES) ×4 IMPLANT
DRAPE IMP U-DRAPE 54X76 (DRAPES) ×4 IMPLANT
DRAPE ORTHO SPLIT 77X108 STRL (DRAPES) ×4
DRAPE PROXIMA HALF (DRAPES) ×8 IMPLANT
DRAPE SURG ORHT 6 SPLT 77X108 (DRAPES) ×4 IMPLANT
DRAPE U-SHAPE 47X51 STRL (DRAPES) ×4 IMPLANT
DRILL CALIBRATED 4.3MMX365 (DRILL) ×4
DRILL CROWE POINT TWIST 4.3 (DRILL) ×4
DRSG EMULSION OIL 3X3 NADH (GAUZE/BANDAGES/DRESSINGS) ×4 IMPLANT
DRSG MEPILEX BORDER 4X4 (GAUZE/BANDAGES/DRESSINGS) ×4 IMPLANT
DRSG PAD ABDOMINAL 8X10 ST (GAUZE/BANDAGES/DRESSINGS) ×8 IMPLANT
DURAPREP 26ML APPLICATOR (WOUND CARE) ×8 IMPLANT
ELECT REM PT RETURN 9FT ADLT (ELECTROSURGICAL) ×4
ELECTRODE REM PT RTRN 9FT ADLT (ELECTROSURGICAL) ×2 IMPLANT
GAUZE SPONGE 4X4 12PLY STRL (GAUZE/BANDAGES/DRESSINGS) ×4 IMPLANT
GAUZE XEROFORM 1X8 LF (GAUZE/BANDAGES/DRESSINGS) ×4 IMPLANT
GLOVE BIOGEL PI IND STRL 6.5 (GLOVE) ×2 IMPLANT
GLOVE BIOGEL PI IND STRL 8 (GLOVE) ×4 IMPLANT
GLOVE BIOGEL PI INDICATOR 6.5 (GLOVE) ×2
GLOVE BIOGEL PI INDICATOR 8 (GLOVE) ×4
GLOVE ECLIPSE 7.5 STRL STRAW (GLOVE) ×8 IMPLANT
GLOVE SURG SS PI 6.0 STRL IVOR (GLOVE) ×4 IMPLANT
GOWN STRL REUS W/ TWL LRG LVL3 (GOWN DISPOSABLE) ×2 IMPLANT
GOWN STRL REUS W/ TWL XL LVL3 (GOWN DISPOSABLE) ×4 IMPLANT
GOWN STRL REUS W/TWL LRG LVL3 (GOWN DISPOSABLE) ×2
GOWN STRL REUS W/TWL XL LVL3 (GOWN DISPOSABLE) ×4
GUIDEWIRE BEAD TIP (WIRE) ×4 IMPLANT
KIT BASIN OR (CUSTOM PROCEDURE TRAY) ×4 IMPLANT
KIT ROOM TURNOVER OR (KITS) ×4 IMPLANT
MANIFOLD NEPTUNE II (INSTRUMENTS) ×4 IMPLANT
NAIL FEM RETRO 12X400 (Nail) ×4 IMPLANT
NEEDLE 22X1 1/2 (OR ONLY) (NEEDLE) ×4 IMPLANT
NS IRRIG 1000ML POUR BTL (IV SOLUTION) ×4 IMPLANT
PACK GENERAL/GYN (CUSTOM PROCEDURE TRAY) ×4 IMPLANT
PACK UNIVERSAL I (CUSTOM PROCEDURE TRAY) ×4 IMPLANT
PAD ARMBOARD 7.5X6 YLW CONV (MISCELLANEOUS) ×8 IMPLANT
PAD CAST 4YDX4 CTTN HI CHSV (CAST SUPPLIES) ×2 IMPLANT
PADDING CAST ABS 6INX4YD NS (CAST SUPPLIES) ×2
PADDING CAST ABS COTTON 6X4 NS (CAST SUPPLIES) ×2 IMPLANT
PADDING CAST COTTON 4X4 STRL (CAST SUPPLIES) ×2
PIN GUIDE 3.2 903003004 (MISCELLANEOUS) ×4 IMPLANT
SCREW CORT TI DBL LEAD 5X38 (Screw) ×4 IMPLANT
SCREW CORT TI DBL LEAD 5X40 (Screw) ×4 IMPLANT
SCREW CORT TI DBL LEAD 5X70 (Screw) ×4 IMPLANT
SCREW CORT TI DBL LEAD 5X95 (Screw) ×4 IMPLANT
SPLINT PLASTER CAST XFAST 5X30 (CAST SUPPLIES) ×2 IMPLANT
SPLINT PLASTER XFAST SET 5X30 (CAST SUPPLIES) ×2
SPONGE GAUZE 4X4 12PLY STER LF (GAUZE/BANDAGES/DRESSINGS) ×4 IMPLANT
SPONGE LAP 18X18 X RAY DECT (DISPOSABLE) ×4 IMPLANT
STAPLER VISISTAT 35W (STAPLE) ×4 IMPLANT
STOCKINETTE IMPERVIOUS LG (DRAPES) ×4 IMPLANT
SUT VIC AB 0 CTB1 27 (SUTURE) ×4 IMPLANT
SUT VIC AB 2-0 CTB1 (SUTURE) ×4 IMPLANT
SYR CONTROL 10ML LL (SYRINGE) ×4 IMPLANT
TAPE CLOTH SURG 4X10 WHT LF (GAUZE/BANDAGES/DRESSINGS) ×4 IMPLANT
TOWEL OR 17X24 6PK STRL BLUE (TOWEL DISPOSABLE) ×4 IMPLANT
TOWEL OR 17X26 10 PK STRL BLUE (TOWEL DISPOSABLE) ×8 IMPLANT
WATER STERILE IRR 1000ML POUR (IV SOLUTION) ×4 IMPLANT

## 2015-04-01 NOTE — ED Notes (Signed)
Ortho at bedside, consent completed. Pt said he will allow this RN to start an IV.

## 2015-04-01 NOTE — ED Notes (Signed)
Pt refusing IV meds, states he only takes oxy 15mg . MD aware, will order.

## 2015-04-01 NOTE — ED Notes (Signed)
Pt refused head CT
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
## 2015-04-01 NOTE — ED Notes (Signed)
Pt stable to transport to radiology without RN.

## 2015-04-01 NOTE — Progress Notes (Signed)
Orthopedic Tech Progress Note Patient Details:  Dustin [name] September 23, 1965 [id number]  Musculoskeletal Traction Type of Traction: Wynonia Hazard Traction Traction Location: RLE    Dustin [name], Dustin [name] 04/01/2015, 5:51 PM

## 2015-04-01 NOTE — ED Notes (Signed)
[employer] [phone]

## 2015-04-01 NOTE — ED Notes (Signed)
Dustin [name] [phone]

## 2015-04-01 NOTE — Anesthesia Preprocedure Evaluation (Addendum)
Anesthesia Evaluation  Patient identified by MRN, date of birth, ID band Patient awake    Reviewed: Allergy & Precautions, NPO status , Patient's Chart, lab work & pertinent test results  Airway Mallampati: I  TM Distance: >3 FB Neck ROM: Full    Dental  (+) Teeth Intact, Dental Advisory Given   Pulmonary neg pulmonary ROS,    breath sounds clear to auscultation       Cardiovascular hypertension,  Rhythm:Regular     Neuro/Psych negative neurological ROS  negative psych ROS   GI/Hepatic negative GI ROS, Neg liver ROS,   Endo/Other  negative endocrine ROS  Renal/GU negative Renal ROS     Musculoskeletal   Abdominal   Peds  Hematology negative hematology ROS (+)   Anesthesia Other Findings   Reproductive/Obstetrics                            Anesthesia Physical Anesthesia Plan  ASA: II and emergent  Anesthesia Plan: General   Post-op Pain Management:    Induction: Intravenous, Rapid sequence and Cricoid pressure planned  Airway Management Planned: Oral ETT  Additional Equipment:   Intra-op Plan:   Post-operative Plan: Extubation in OR  Informed Consent: I have reviewed the patients History and Physical, chart, labs and discussed the procedure including the risks, benefits and alternatives for the proposed anesthesia with the patient or authorized representative who has indicated his/her understanding and acceptance.   Dental advisory given  Plan Discussed with: Surgeon and CRNA  Anesthesia Plan Comments:        Anesthesia Quick Evaluation

## 2015-04-01 NOTE — ED Notes (Signed)
Pt will be transported to OR. Pt undressed, pt consented to this RN cutting his underwear off and stated that they may be thrown away. Friends took other belongings home. Pt cell phone will remain with this RN until friends return to the hospital. Eustaquio Maize (friend) has been called and informed of the plan.
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
## 2015-04-01 NOTE — Anesthesia Procedure Notes (Signed)
Procedure Name: Intubation Date/Time: 04/01/2015 10:42 PM Performed by: Valetta Fuller Pre-anesthesia Checklist: Patient identified, Emergency Drugs available, Suction available and Patient being monitored Patient Re-evaluated:Patient Re-evaluated prior to inductionOxygen Delivery Method: Circle system utilized Preoxygenation: Pre-oxygenation with 100% oxygen Intubation Type: IV induction, Rapid sequence and Cricoid Pressure applied Laryngoscope Size: Miller and 2 Grade View: Grade II Tube type: Oral Number of attempts: 1 Airway Equipment and Method: Stylet Placement Confirmation: ETT inserted through vocal cords under direct vision,  positive ETCO2 and breath sounds checked- equal and bilateral Secured at: 23 cm Tube secured with: Tape Dental Injury: Teeth and Oropharynx as per pre-operative assessment
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
## 2015-04-02 ENCOUNTER — Inpatient Hospital Stay (HOSPITAL_COMMUNITY): Payer: Commercial Managed Care - HMO

## 2015-04-02 DIAGNOSIS — S61412A Laceration without foreign body of left hand, initial encounter: Secondary | ICD-10-CM

## 2015-04-02 DIAGNOSIS — S7291XA Unspecified fracture of right femur, initial encounter for closed fracture: Secondary | ICD-10-CM | POA: Diagnosis present

## 2015-04-02 DIAGNOSIS — S52502A Unspecified fracture of the lower end of left radius, initial encounter for closed fracture: Secondary | ICD-10-CM | POA: Diagnosis present

## 2015-04-02 DIAGNOSIS — S61219A Laceration without foreign body of unspecified finger without damage to nail, initial encounter: Secondary | ICD-10-CM | POA: Diagnosis present

## 2015-04-02 MED ORDER — DOCUSATE SODIUM 100 MG PO CAPS
100.0000 mg | ORAL_CAPSULE | Freq: Two times a day (BID) | ORAL | Status: DC
  Administered 2015-04-02: 100 mg via ORAL
  Filled 2015-04-02 (×3): qty 1

## 2015-04-02 MED ORDER — GLYCOPYRROLATE 0.2 MG/ML IJ SOLN
INTRAMUSCULAR | Status: AC
  Filled 2015-04-02: qty 3

## 2015-04-02 MED ORDER — ACETAMINOPHEN 650 MG RE SUPP
650.0000 mg | Freq: Four times a day (QID) | RECTAL | Status: DC | PRN
Start: 2015-04-02 — End: 2015-04-03

## 2015-04-02 MED ORDER — PROMETHAZINE HCL 25 MG/ML IJ SOLN
12.5000 mg | Freq: Four times a day (QID) | INTRAMUSCULAR | Status: DC | PRN
  Administered 2015-04-02 – 2015-04-03 (×3): 12.5 mg via INTRAVENOUS
  Filled 2015-04-02 (×3): qty 1

## 2015-04-02 MED ORDER — DEXTROSE-NACL 5-0.45 % IV SOLN
INTRAVENOUS | Status: DC
  Administered 2015-04-02: 06:00:00 via INTRAVENOUS

## 2015-04-02 MED ORDER — OXYCODONE HCL 5 MG PO TABS
ORAL_TABLET | ORAL | Status: AC
  Filled 2015-04-02: qty 1

## 2015-04-02 MED ORDER — HYDROMORPHONE HCL 1 MG/ML IJ SOLN
INTRAMUSCULAR | Status: AC
  Filled 2015-04-02: qty 1

## 2015-04-02 MED ORDER — ACETAMINOPHEN 325 MG PO TABS: 325.0000 mg | ORAL_TABLET | ORAL | Status: DC | PRN

## 2015-04-02 MED ORDER — ACETAMINOPHEN 160 MG/5ML PO SOLN
325.0000 mg | ORAL | Status: DC | PRN
  Filled 2015-04-02: qty 20.3

## 2015-04-02 MED ORDER — ZOLPIDEM TARTRATE 5 MG PO TABS: 5.0000 mg | ORAL_TABLET | Freq: Every evening | ORAL | Status: DC | PRN

## 2015-04-02 MED ORDER — ACETAMINOPHEN 325 MG PO TABS: 650.0000 mg | ORAL_TABLET | Freq: Four times a day (QID) | ORAL | Status: DC | PRN

## 2015-04-02 MED ORDER — NEOSTIGMINE METHYLSULFATE 10 MG/10ML IV SOLN
INTRAVENOUS | Status: AC
  Filled 2015-04-02: qty 1

## 2015-04-02 MED ORDER — OXYCODONE HCL 5 MG/5ML PO SOLN: 5.0000 mg | Freq: Once | ORAL | Status: AC | PRN

## 2015-04-02 MED ORDER — OXYCODONE HCL 5 MG PO TABS
5.0000 mg | ORAL_TABLET | Freq: Once | ORAL | Status: AC | PRN
  Administered 2015-04-02: 5 mg via ORAL

## 2015-04-02 MED ORDER — ONDANSETRON HCL 4 MG/2ML IJ SOLN
INTRAMUSCULAR | Status: DC | PRN
  Administered 2015-04-02: 4 mg via INTRAVENOUS

## 2015-04-02 MED ORDER — NEOSTIGMINE METHYLSULFATE 10 MG/10ML IV SOLN
INTRAVENOUS | Status: DC | PRN
  Administered 2015-04-02: 4 mg via INTRAVENOUS

## 2015-04-02 MED ORDER — OXYCODONE HCL ER 40 MG PO T12A
40.0000 mg | EXTENDED_RELEASE_TABLET | Freq: Two times a day (BID) | ORAL | Status: DC
  Administered 2015-04-02: 40 mg via ORAL
  Filled 2015-04-02 (×3): qty 1

## 2015-04-02 MED ORDER — OXYCODONE HCL 5 MG PO TABS
15.0000 mg | ORAL_TABLET | Freq: Four times a day (QID) | ORAL | Status: DC | PRN
  Administered 2015-04-02 – 2015-04-03 (×4): 15 mg via ORAL
  Filled 2015-04-02 (×4): qty 3

## 2015-04-02 MED ORDER — CYCLOBENZAPRINE HCL 10 MG PO TABS
10.0000 mg | ORAL_TABLET | Freq: Three times a day (TID) | ORAL | Status: DC | PRN
  Administered 2015-04-03: 10 mg via ORAL
  Filled 2015-04-02: qty 1

## 2015-04-02 MED ORDER — ONDANSETRON HCL 4 MG PO TABS
4.0000 mg | ORAL_TABLET | Freq: Four times a day (QID) | ORAL | Status: DC | PRN
  Filled 2015-04-02: qty 1

## 2015-04-02 MED ORDER — GLYCOPYRROLATE 0.2 MG/ML IJ SOLN
INTRAMUSCULAR | Status: DC | PRN
  Administered 2015-04-02: .6 mg via INTRAVENOUS

## 2015-04-02 MED ORDER — HYDROMORPHONE HCL 1 MG/ML IJ SOLN
1.0000 mg | INTRAMUSCULAR | Status: DC | PRN
  Administered 2015-04-02 (×3): 2 mg via INTRAVENOUS
  Administered 2015-04-03: 1 mg via INTRAVENOUS
  Filled 2015-04-02: qty 2
  Filled 2015-04-02: qty 1
  Filled 2015-04-02 (×2): qty 2

## 2015-04-02 MED ORDER — HYDROMORPHONE HCL 1 MG/ML IJ SOLN
0.2500 mg | INTRAMUSCULAR | Status: DC | PRN
  Administered 2015-04-02 (×4): 0.5 mg via INTRAVENOUS

## 2015-04-02 MED ORDER — CEFAZOLIN SODIUM-DEXTROSE 2-3 GM-% IV SOLR
2.0000 g | Freq: Four times a day (QID) | INTRAVENOUS | Status: AC
  Administered 2015-04-02 (×2): 2 g via INTRAVENOUS
  Filled 2015-04-02 (×3): qty 50

## 2015-04-02 MED ORDER — ENOXAPARIN SODIUM 40 MG/0.4ML ~~LOC~~ SOLN
40.0000 mg | SUBCUTANEOUS | Status: DC
  Filled 2015-04-02: qty 0.4

## 2015-04-02 MED ORDER — ONDANSETRON HCL 4 MG/2ML IJ SOLN
INTRAMUSCULAR | Status: AC
  Filled 2015-04-02: qty 4

## 2015-04-02 MED ORDER — ONDANSETRON HCL 4 MG/2ML IJ SOLN: 4.0000 mg | Freq: Four times a day (QID) | INTRAMUSCULAR | Status: DC | PRN

## 2015-04-02 MED ORDER — KETOROLAC TROMETHAMINE 15 MG/ML IJ SOLN
15.0000 mg | Freq: Three times a day (TID) | INTRAMUSCULAR | Status: AC
  Administered 2015-04-02 – 2015-04-03 (×4): 15 mg via INTRAVENOUS
  Filled 2015-04-02 (×4): qty 1
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
## 2015-04-02 NOTE — Transfer of Care (Signed)
Immediate Anesthesia Transfer of Care Note  Patient: Dustin [name]  Procedure(s) Performed: Procedure(s): INTRAMEDULLARY (IM) RETROGRADE FEMORAL NAILING  (Right) CLOSED REDUCTION WRIST AND CASTING REPAIR LACERATION TO FINGERS TIMES 2 (Left)  Patient Location: PACU  Anesthesia Type:General  Level of Consciousness: awake, alert  and oriented  Airway & Oxygen Therapy: Patient connected to nasal cannula oxygen  Post-op Assessment: Report given to RN, Post -op Vital signs reviewed and stable and Patient moving all extremities X 4  Post vital signs: Reviewed and stable  Last Vitals:  Filed Vitals:   04/01/15 2030 04/01/15 2100  BP: 105/79 93/65  Pulse: 107 118  Temp:    Resp: 16 14    Complications: No apparent anesthesia complications

## 2015-04-02 NOTE — Brief Op Note (Signed)
04/01/2015 - 04/02/2015  1:18 PM  PATIENT:  Dustin [name]  50 y.o. male  PRE-OPERATIVE DIAGNOSIS:  right femur fracture, left distal radius  POST-OPERATIVE DIAGNOSIS:  right femur fracture, left distal radius  PROCEDURE:  Procedure(s): INTRAMEDULLARY (IM) RETROGRADE FEMORAL NAILING  (Right) CLOSED REDUCTION WRIST AND CASTING REPAIR LACERATION TO FINGERS TIMES 2 (Left)  SURGEON:  Surgeon(s) and Role:    * Dorna Leitz, MD - Primary  PHYSICIAN ASSISTANT:   ASSISTANTS: bethune   ANESTHESIA:   general  EBL:  Total I/O In: 240 [P.O.:240] Out: -   BLOOD ADMINISTERED:none  DRAINS: none   LOCAL MEDICATIONS USED:  NONE  SPECIMEN:  No Specimen  DISPOSITION OF SPECIMEN:  N/A  COUNTS:  YES  TOURNIQUET:   Total Tourniquet Time Documented: Thigh (Right) - 34 minutes Total: Thigh (Right) - 34 minutes   DICTATION: .Other Dictation: Dictation Number [id number]  PLAN OF CARE: Admit to inpatient   PATIENT DISPOSITION:  PACU - hemodynamically stable.   Delay start of Pharmacological VTE agent (>24hrs) due to surgical blood loss or risk of bleeding: no
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
## 2015-04-03 MED ORDER — HYDROMORPHONE HCL 4 MG PO TABS: 4.0000 mg | ORAL_TABLET | Freq: Four times a day (QID) | ORAL | Status: DC | PRN

## 2015-04-03 MED ORDER — ASPIRIN EC 325 MG PO TBEC: 325.0000 mg | DELAYED_RELEASE_TABLET | Freq: Two times a day (BID) | ORAL | Status: DC

## 2015-04-03 NOTE — Progress Notes (Signed)
PT Cancellation Note  Patient Details Name: Dustin [name] MRN: [id number] DOB: Jul 30, 1965   Cancelled Treatment:    Reason Eval/Treat Not Completed: Other (comment) (pt discharging).  Pt transferring to [facility] for d/c upon PT arrival.    Joslyn Hy PT, DPT [phone] Pager: [phone] 04/03/2015, 2:15 PM

## 2015-04-03 NOTE — Progress Notes (Signed)
Pt discharge education and instructions completed with pt and spouse at bedside; both voices understanding and denied any questions. Pt incision dsgs clean, dry and intact with no stain or active bleeding. Pt discharge home with spouse to transport him home. Pt handed his prescriptions dilaudid and aspirin. Pt home platform rolling walker and 3in1 equipments delivered to pt at bedside. Pt transported off unit via wheelchair with belongings and spouse to the side. Delia Heady RN

## 2015-04-03 NOTE — Progress Notes (Addendum)
Subjective: 2 Days Post-Op Procedure(s) (LRB): INTRAMEDULLARY (IM) RETROGRADE FEMORAL NAILING  (Right) CLOSED REDUCTION WRIST AND CASTING REPAIR LACERATION TO FINGERS TIMES 2 (Left) Patient reports pain as moderate.  Patient refused blood work yesterday morning. Denies dizziness. Wants to go home. Would like a prescription for by mouth Dilaudid. Taking by mouth and voiding okay. Up with therapy. No chest pain or shortness of breath.  Objective: Vital signs in last 24 hours: Temp:  [97.7 F (36.5 C)-98.3 F (36.8 C)] 98.3 F (36.8 C) (02/12 0544) Pulse Rate:  [112-119] 119 (02/12 0544) Resp:  [18] 18 (02/12 0544) BP: (116-132)/(72-81) 132/81 mmHg (02/12 0544) SpO2:  [93 %-98 %] 97 % (02/12 0544)  Intake/Output from previous day: 02/11 0701 - 02/12 0700 In: 240 [P.O.:240] Out: -  Intake/Output this shift: Total I/O In: 240 [P.O.:240] Out: -    Recent Labs  04/01/15 1725  HGB 14.1    Recent Labs  04/01/15 1725  WBC 14.8*  RBC 4.83  HCT 43.1  PLT 267    Recent Labs  04/01/15 1725  NA 139  K 3.2*  CL 104  CO2 17*  BUN 11  CREATININE 1.29*  GLUCOSE 193*  CALCIUM 8.6*    Recent Labs  04/01/15 1725  INR 1.04   right leg exam: Moderate right thigh swelling. Nothing tense. Right calf soft and nontender. Neurovascular status intact distally. Distal pulses 2+. Left upper extremity exam: Sugar tong splint intact. Moves fingers actively. His dressings on his finger laceration repairs look good. No sign of infection.    Assessment/Plan: 2 Days Post-Op Procedure(s) (LRB): INTRAMEDULLARY (IM) RETROGRADE FEMORAL NAILING  (Right) CLOSED REDUCTION WRIST AND CASTING REPAIR LACERATION TO FINGERS TIMES 2 (Left) Plan: Discharge home today. Rx for Dilaudid 4 mg one every 6 hours as needed for breakthrough pain #40 no refills. Rx for aspirin 325 mg enteric-coated twice daily for DVT prophylaxis 1 month postop. Weight-bear as tolerated on left upper extremity with  triceps walker. 50% partial weightbearing on right lower extremity. Follow-up with Dr. Berenice Primas in 2 weeks. Again, we talked about open reduction internal fixation of his left distal radius fracture. The patient states that he does not want any more surgery on his left wrist he understands that he may have continued and chronic left wrist pain and collapse of his left wrist fracture if treated nonoperatively. He is willing to accept this.   Dustin [name] G 04/03/2015, 10:13 AM

## 2015-04-03 NOTE — Anesthesia Postprocedure Evaluation (Signed)
Anesthesia Post Note  Patient: Dustin [name]  Procedure(s) Performed: Procedure(s) (LRB): INTRAMEDULLARY (IM) RETROGRADE FEMORAL NAILING  (Right) CLOSED REDUCTION WRIST AND CASTING REPAIR LACERATION TO FINGERS TIMES 2 (Left)  Patient location during evaluation: PACU Level of consciousness: awake Pain management: pain level controlled Vital Signs Assessment: post-procedure vital signs reviewed and stable Respiratory status: spontaneous breathing Cardiovascular status: stable Postop Assessment: no signs of nausea or vomiting    Last Vitals:  Filed Vitals:   04/02/15 2209 04/03/15 0544  BP: 123/72 132/81  Pulse: 112 119  Temp: 36.7 C 36.8 C  Resp: 18 18    Last Pain:  Filed Vitals:   04/03/15 0845  PainSc: 7                  Mariene Dickerman

## 2015-04-03 NOTE — Discharge Instructions (Signed)
Ambulate with walker using triceps attachment. Made full weight-bear on left upper extremity with walker. 50% partial weightbearing on right lower extremity. Please be sure to take your aspirin as directed so that you do not get a blood clot.
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
## 2015-04-04 ENCOUNTER — Encounter (HOSPITAL_COMMUNITY): Payer: Self-pay | Admitting: Orthopedic Surgery

## 2015-04-04 ENCOUNTER — Encounter (HOSPITAL_COMMUNITY): Payer: Self-pay | Admitting: Emergency Medicine

## 2015-04-12 ENCOUNTER — Emergency Department (HOSPITAL_BASED_OUTPATIENT_CLINIC_OR_DEPARTMENT_OTHER): Payer: Commercial Managed Care - HMO

## 2015-04-12 ENCOUNTER — Encounter (HOSPITAL_BASED_OUTPATIENT_CLINIC_OR_DEPARTMENT_OTHER): Payer: Self-pay | Admitting: *Deleted

## 2015-04-12 ENCOUNTER — Emergency Department (HOSPITAL_BASED_OUTPATIENT_CLINIC_OR_DEPARTMENT_OTHER)
Admission: EM | Admit: 2015-04-12 | Discharge: 2015-04-12 | Disposition: A | Discharge status: Home / Self Care | Payer: Commercial Managed Care - HMO | Attending: Emergency Medicine | Admitting: Emergency Medicine

## 2015-04-12 DIAGNOSIS — M21932 Unspecified acquired deformity of left forearm: Secondary | ICD-10-CM | POA: Insufficient documentation

## 2015-04-12 DIAGNOSIS — G8918 Other acute postprocedural pain: Secondary | ICD-10-CM | POA: Insufficient documentation

## 2015-04-12 DIAGNOSIS — Z91040 Latex allergy status: Secondary | ICD-10-CM | POA: Diagnosis not present

## 2015-04-12 DIAGNOSIS — E669 Obesity, unspecified: Secondary | ICD-10-CM | POA: Insufficient documentation

## 2015-04-12 DIAGNOSIS — Z7951 Long term (current) use of inhaled steroids: Secondary | ICD-10-CM | POA: Diagnosis not present

## 2015-04-12 DIAGNOSIS — Z7982 Long term (current) use of aspirin: Secondary | ICD-10-CM | POA: Insufficient documentation

## 2015-04-12 DIAGNOSIS — I10 Essential (primary) hypertension: Secondary | ICD-10-CM | POA: Insufficient documentation

## 2015-04-12 DIAGNOSIS — M79604 Pain in right leg: Secondary | ICD-10-CM | POA: Diagnosis present

## 2015-04-12 DIAGNOSIS — Z8781 Personal history of (healed) traumatic fracture: Secondary | ICD-10-CM | POA: Diagnosis not present

## 2015-04-12 DIAGNOSIS — Z79899 Other long term (current) drug therapy: Secondary | ICD-10-CM | POA: Diagnosis not present

## 2015-04-12 DIAGNOSIS — M7989 Other specified soft tissue disorders: Secondary | ICD-10-CM | POA: Diagnosis not present

## 2015-04-12 DIAGNOSIS — G8929 Other chronic pain: Secondary | ICD-10-CM | POA: Diagnosis not present

## 2015-04-12 MED ORDER — HYDROMORPHONE HCL 4 MG PO TABS: 4.0000 mg | ORAL_TABLET | ORAL | Status: DC | PRN

## 2015-04-12 MED ORDER — HYDROMORPHONE HCL 1 MG/ML IJ SOLN
1.0000 mg | Freq: Once | INTRAMUSCULAR | Status: AC
  Administered 2015-04-12: 1 mg via INTRAMUSCULAR
  Filled 2015-04-12: qty 1

## 2015-04-12 MED FILL — HYDROmorphone HCL 4 MG TABS: 4 | 3 days supply | Qty: 10 | Fill #0
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
## 2015-04-12 NOTE — ED Notes (Signed)
Patient transported to Ultrasound and xr

## 2015-04-12 NOTE — ED Notes (Signed)
Pt c/o right leg pain and swelling x 2 days , Procedure 04/01/15 nailing of hip

## 2015-04-12 NOTE — ED Notes (Signed)
Pa  at bedside. 

## 2015-04-12 NOTE — ED Notes (Signed)
Patient transported to Ultrasound 

## 2015-04-12 NOTE — Discharge Instructions (Signed)
Take pain medication as prescribed. Follow up with Dr. Berenice Primas as soon as able. Continue to keep leg elevated. Only 20lb of weight on that leg!  Hematoma A hematoma is a collection of blood under the skin, in an organ, in a body space, in a joint space, or in other tissue. The blood can clot to form a lump that you can see and feel. The lump is often firm and may sometimes become sore and tender. Most hematomas get better in a few days to weeks. However, some hematomas may be serious and require medical care. Hematomas can range in size from very small to very large. CAUSES  A hematoma can be caused by a blunt or penetrating injury. It can also be caused by spontaneous leakage from a blood vessel under the skin. Spontaneous leakage from a blood vessel is more likely to occur in older people, especially those taking blood thinners. Sometimes, a hematoma can develop after certain medical procedures. SIGNS AND SYMPTOMS   A firm lump on the body.  Possible pain and tenderness in the area.  Bruising.Blue, dark blue, purple-red, or yellowish skin may appear at the site of the hematoma if the hematoma is close to the surface of the skin. For hematomas in deeper tissues or body spaces, the signs and symptoms may be subtle. For example, an intra-abdominal hematoma may cause abdominal pain, weakness, fainting, and shortness of breath. An intracranial hematoma may cause a headache or symptoms such as weakness, trouble speaking, or a change in consciousness. DIAGNOSIS  A hematoma can usually be diagnosed based on your medical history and a physical exam. Imaging tests may be needed if your health care provider suspects a hematoma in deeper tissues or body spaces, such as the abdomen, head, or chest. These tests may include ultrasonography or a CT scan.  TREATMENT  Hematomas usually go away on their own over time. Rarely does the blood need to be drained out of the body. Large hematomas or those that may affect  vital organs will sometimes need surgical drainage or monitoring. HOME CARE INSTRUCTIONS   Apply ice to the injured area:   Put ice in a plastic bag.   Place a towel between your skin and the bag.   Leave the ice on for 20 minutes, 2-3 times a day for the first 1 to 2 days.   After the first 2 days, switch to using warm compresses on the hematoma.   Elevate the injured area to help decrease pain and swelling. Wrapping the area with an elastic bandage may also be helpful. Compression helps to reduce swelling and promotes shrinking of the hematoma. Make sure the bandage is not wrapped too tight.   If your hematoma is on a lower extremity and is painful, crutches may be helpful for a couple days.   Only take over-the-counter or prescription medicines as directed by your health care provider. SEEK IMMEDIATE MEDICAL CARE IF:   You have increasing pain, or your pain is not controlled with medicine.   You have a fever.   You have worsening swelling or discoloration.   Your skin over the hematoma breaks or starts bleeding.   Your hematoma is in your chest or abdomen and you have weakness, shortness of breath, or a change in consciousness.  Your hematoma is on your scalp (caused by a fall or injury) and you have a worsening headache or a change in alertness or consciousness. MAKE SURE YOU:   Understand these instructions.  Will  watch your condition.  Will get help right away if you are not doing well or get worse.   This information is not intended to replace advice given to you by your health care provider. Make sure you discuss any questions you have with your health care provider.   Document Released: 09/20/2003 Document Revised: 10/08/2012 Document Reviewed: 07/16/2012 Elsevier Interactive Patient Education [employer].

## 2015-06-27 ENCOUNTER — Emergency Department (HOSPITAL_BASED_OUTPATIENT_CLINIC_OR_DEPARTMENT_OTHER): Payer: Medicare HMO

## 2015-06-27 ENCOUNTER — Emergency Department (HOSPITAL_BASED_OUTPATIENT_CLINIC_OR_DEPARTMENT_OTHER)
Admission: EM | Admit: 2015-06-27 | Discharge: 2015-06-28 | Disposition: A | Discharge status: Home / Self Care | Payer: Medicare HMO | Attending: Emergency Medicine | Admitting: Emergency Medicine

## 2015-06-27 ENCOUNTER — Encounter (HOSPITAL_BASED_OUTPATIENT_CLINIC_OR_DEPARTMENT_OTHER): Payer: Self-pay | Admitting: *Deleted

## 2015-06-27 DIAGNOSIS — R2241 Localized swelling, mass and lump, right lower limb: Secondary | ICD-10-CM | POA: Insufficient documentation

## 2015-06-27 DIAGNOSIS — E669 Obesity, unspecified: Secondary | ICD-10-CM | POA: Diagnosis not present

## 2015-06-27 DIAGNOSIS — M79604 Pain in right leg: Secondary | ICD-10-CM | POA: Diagnosis present

## 2015-06-27 DIAGNOSIS — I10 Essential (primary) hypertension: Secondary | ICD-10-CM | POA: Diagnosis not present

## 2015-06-27 LAB — BASIC METABOLIC PANEL
ANION GAP: 4 — AB (ref 5–15)
BUN: 12 mg/dL (ref 6–20)
CO2: 25 mmol/L (ref 22–32)
Calcium: 8.8 mg/dL — ABNORMAL LOW (ref 8.9–10.3)
Chloride: 105 mmol/L (ref 101–111)
Creatinine, Ser: 0.72 mg/dL (ref 0.61–1.24)
GFR calc Af Amer: >60 mL/min (ref >60)
GLUCOSE: 120 mg/dL — AB (ref 65–99)
Potassium: 3.8 mmol/L (ref 3.5–5.1)
SODIUM: 134 mmol/L — AB (ref 135–145)

## 2015-06-27 LAB — CBC WITH DIFFERENTIAL/PLATELET
BASOS ABS: 0 10*3/uL (ref 0.0–0.1)
Basophils Relative: 0 %
EOS ABS: 0.1 10*3/uL (ref 0.0–0.7)
Eosinophils Relative: 1 %
HCT: 36.6 % — ABNORMAL LOW (ref 39.0–52.0)
HEMOGLOBIN: 11.5 g/dL — AB (ref 13.0–17.0)
Lymphocytes Relative: 23 %
Lymphs Abs: 2.2 10*3/uL (ref 0.7–4.0)
MCH: 26.7 pg (ref 26.0–34.0)
MCHC: 31.4 g/dL (ref 30.0–36.0)
MCV: 85.1 fL (ref 78.0–100.0)
Monocytes Absolute: 0.7 10*3/uL (ref 0.1–1.0)
Monocytes Relative: 8 %
NEUTROS PCT: 68 %
Neutro Abs: 6.4 10*3/uL (ref 1.7–7.7)
PLATELETS: 387 10*3/uL (ref 150–400)
RBC: 4.3 MIL/uL (ref 4.22–5.81)
RDW: 16.6 % — ABNORMAL HIGH (ref 11.5–15.5)
WBC: 9.4 10*3/uL (ref 4.0–10.5)
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
## 2015-06-28 DIAGNOSIS — R2241 Localized swelling, mass and lump, right lower limb: Secondary | ICD-10-CM | POA: Diagnosis not present

## 2015-06-28 MED ORDER — IOPAMIDOL (ISOVUE-370) INJECTION 76%
100.0000 mL | Freq: Once | INTRAVENOUS | Status: AC | PRN
  Administered 2015-06-28: 100 mL via INTRAVENOUS

## 2015-06-28 NOTE — Discharge Instructions (Signed)
Please follow with your primary care doctor in the next 2 days for a check-up. They must obtain records for further management.  ° °Do not hesitate to return to the Emergency Department for any new, worsening or concerning symptoms.  ° °

## 2015-07-01 ENCOUNTER — Ambulatory Visit: Payer: Medicare HMO

## 2015-09-19 ENCOUNTER — Encounter (HOSPITAL_BASED_OUTPATIENT_CLINIC_OR_DEPARTMENT_OTHER): Payer: Self-pay | Admitting: *Deleted

## 2015-09-19 ENCOUNTER — Inpatient Hospital Stay (HOSPITAL_BASED_OUTPATIENT_CLINIC_OR_DEPARTMENT_OTHER): Admit: 2015-09-19 | Payer: Commercial Managed Care - HMO

## 2015-09-19 ENCOUNTER — Emergency Department (HOSPITAL_BASED_OUTPATIENT_CLINIC_OR_DEPARTMENT_OTHER)
Admission: EM | Admit: 2015-09-19 | Discharge: 2015-09-19 | Disposition: A | Discharge status: Home / Self Care | Payer: Medicare HMO | Attending: Emergency Medicine | Admitting: Emergency Medicine

## 2015-09-19 ENCOUNTER — Emergency Department (HOSPITAL_BASED_OUTPATIENT_CLINIC_OR_DEPARTMENT_OTHER): Payer: Medicare HMO

## 2015-09-19 DIAGNOSIS — Z7982 Long term (current) use of aspirin: Secondary | ICD-10-CM | POA: Diagnosis not present

## 2015-09-19 DIAGNOSIS — M25561 Pain in right knee: Secondary | ICD-10-CM | POA: Insufficient documentation

## 2015-09-19 DIAGNOSIS — M79651 Pain in right thigh: Secondary | ICD-10-CM | POA: Insufficient documentation

## 2015-09-19 DIAGNOSIS — I10 Essential (primary) hypertension: Secondary | ICD-10-CM | POA: Diagnosis not present
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
## 2015-09-19 NOTE — ED Notes (Signed)
Returned from xray

## 2015-09-19 NOTE — ED Notes (Signed)
Pt aware that he will return at 9am for u/s.
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
## 2015-09-19 NOTE — Discharge Instructions (Signed)
You were seen in the emergency department right thigh and knee pain. X-rays show no new fracture and appears her hardware is intact. We are ordering an ultrasound to be performed in the morning to evaluate for blood clot also known as a DVT. You have strong pulses and no sign of infection in your leg. I do not think there is a obstruction in your arteries. I do recommend close follow-up with Dustin [name], your orthopedic surgeon as scheduled for evaluation for the fluid collection in your thigh. Please take your pain medication as prescribed. If you notice that you're having increasing pain, swelling, you may keep your leg elevated above the level of your heart at rest, apply ice.

## 2015-11-24 ENCOUNTER — Encounter (HOSPITAL_BASED_OUTPATIENT_CLINIC_OR_DEPARTMENT_OTHER): Payer: Self-pay | Admitting: *Deleted

## 2015-11-24 ENCOUNTER — Emergency Department (HOSPITAL_BASED_OUTPATIENT_CLINIC_OR_DEPARTMENT_OTHER)
Admission: EM | Admit: 2015-11-24 | Discharge: 2015-11-24 | Disposition: A | Discharge status: Home / Self Care | Payer: Medicare HMO | Attending: Emergency Medicine | Admitting: Emergency Medicine

## 2015-11-24 ENCOUNTER — Emergency Department (HOSPITAL_BASED_OUTPATIENT_CLINIC_OR_DEPARTMENT_OTHER): Payer: Medicare HMO

## 2015-11-24 DIAGNOSIS — Z79891 Long term (current) use of opiate analgesic: Secondary | ICD-10-CM | POA: Diagnosis not present

## 2015-11-24 DIAGNOSIS — Z79899 Other long term (current) drug therapy: Secondary | ICD-10-CM | POA: Diagnosis not present

## 2015-11-24 DIAGNOSIS — M7989 Other specified soft tissue disorders: Secondary | ICD-10-CM | POA: Diagnosis present

## 2015-11-24 DIAGNOSIS — I10 Essential (primary) hypertension: Secondary | ICD-10-CM | POA: Diagnosis not present

## 2015-11-24 DIAGNOSIS — M79604 Pain in right leg: Secondary | ICD-10-CM | POA: Insufficient documentation

## 2015-11-24 DIAGNOSIS — Z7982 Long term (current) use of aspirin: Secondary | ICD-10-CM | POA: Diagnosis not present

## 2015-11-24 NOTE — ED Notes (Signed)
Patient back from US.
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
## 2015-11-24 NOTE — ED Notes (Addendum)
Patient transported to US 

## 2015-11-24 NOTE — Discharge Instructions (Signed)
Follow-up with your primary care physician  Orthopedic follow-up recommended for second opinion regarding your right knee.

## 2015-11-24 NOTE — ED Triage Notes (Signed)
Patient states he has right leg surgery in Feb., 2017.  States he continues to have pain in the right thigh.  States for the last two weeks the pain has gotten worse.  States he is here today to get a DVT test and find out what is going on with his leg.  States he has had multiple visits to the ED for the same.

## 2015-12-14 ENCOUNTER — Telehealth: Payer: Self-pay | Admitting: *Deleted

## 2015-12-14 ENCOUNTER — Encounter: Payer: Self-pay | Admitting: *Deleted

## 2015-12-14 NOTE — Telephone Encounter (Signed)
Pre-Visit Call completed with patient and chart updated.   Pre-Visit Info documented in Specialty Comments under [facility].    

## 2015-12-15 ENCOUNTER — Ambulatory Visit (INDEPENDENT_AMBULATORY_CARE_PROVIDER_SITE_OTHER): Payer: Medicare HMO | Admitting: Family Medicine

## 2015-12-15 ENCOUNTER — Encounter: Payer: Self-pay | Admitting: Family Medicine

## 2015-12-15 VITALS — BP 164/110 | HR 78 | Temp 98.0°F | Ht 73.5 in | Wt 251.2 lb

## 2015-12-15 DIAGNOSIS — M79651 Pain in right thigh: Secondary | ICD-10-CM

## 2015-12-15 DIAGNOSIS — I10 Essential (primary) hypertension: Secondary | ICD-10-CM | POA: Diagnosis not present

## 2015-12-15 DIAGNOSIS — Z6832 Body mass index (BMI) 32.0-32.9, adult: Secondary | ICD-10-CM

## 2015-12-15 DIAGNOSIS — E6609 Other obesity due to excess calories: Secondary | ICD-10-CM | POA: Diagnosis not present

## 2015-12-15 LAB — COMPREHENSIVE METABOLIC PANEL
ALT: 12 U/L (ref 0–53)
AST: 13 U/L (ref 0–37)
Albumin: 4.3 g/dL (ref 3.5–5.2)
Alkaline Phosphatase: 107 U/L (ref 39–117)
BILIRUBIN TOTAL: 0.7 mg/dL (ref 0.2–1.2)
BUN: 14 mg/dL (ref 6–23)
CO2: 29 meq/L (ref 19–32)
Calcium: 9.8 mg/dL (ref 8.4–10.5)
Chloride: 103 mEq/L (ref 96–112)
Creatinine, Ser: 0.84 mg/dL (ref 0.40–1.50)
GFR: 123.35 mL/min (ref >60.00)
GLUCOSE: 95 mg/dL (ref 70–99)
Potassium: 4 mEq/L (ref 3.5–5.1)
SODIUM: 139 meq/L (ref 135–145)
Total Protein: 7.6 g/dL (ref 6.0–8.3)

## 2015-12-15 LAB — LIPID PANEL
CHOL/HDL RATIO: 3
Cholesterol: 170 mg/dL (ref 0–200)
HDL: 62.6 mg/dL (ref >39.00)
LDL Cholesterol: 90 mg/dL (ref 0–99)
NONHDL: 107.61
Triglycerides: 88 mg/dL (ref 0.0–149.0)
VLDL: 17.6 mg/dL (ref 0.0–40.0)

## 2015-12-15 LAB — MICROALBUMIN / CREATININE URINE RATIO
Creatinine,U: 43.2 mg/dL
MICROALB/CREAT RATIO: 1.6 mg/g (ref 0.0–30.0)

## 2015-12-15 MED ORDER — AMLODIPINE BESYLATE 5 MG PO TABS: 5.0000 mg | ORAL_TABLET | Freq: Every day | ORAL | 1 refills | Status: DC
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
## 2015-12-15 NOTE — Progress Notes (Signed)
Pre visit review using our clinic review tool, if applicable. No additional management support is needed unless otherwise documented below in the visit note. 

## 2015-12-21 ENCOUNTER — Encounter: Payer: Self-pay | Admitting: Family Medicine

## 2015-12-21 DIAGNOSIS — M25561 Pain in right knee: Principal | ICD-10-CM

## 2015-12-21 DIAGNOSIS — M25461 Effusion, right knee: Secondary | ICD-10-CM

## 2015-12-22 NOTE — Telephone Encounter (Signed)
Referral placed.

## 2016-01-18 ENCOUNTER — Ambulatory Visit (INDEPENDENT_AMBULATORY_CARE_PROVIDER_SITE_OTHER): Payer: Medicare HMO | Admitting: Family Medicine

## 2016-01-18 ENCOUNTER — Encounter: Payer: Self-pay | Admitting: Family Medicine

## 2016-01-18 VITALS — BP 170/108 | HR 98 | Temp 98.5°F | Ht 74.0 in | Wt 251.4 lb

## 2016-01-18 DIAGNOSIS — I10 Essential (primary) hypertension: Secondary | ICD-10-CM

## 2016-01-18 NOTE — Progress Notes (Signed)
Chief Complaint  Patient presents with  . Follow-up    BP    Subjective Dustin [name] is a 50 y.o. male who presents for hypertension follow up. He does monitor home blood pressures. Blood pressures ranging from 140's/90's on average. He is compliant with medications- started on Norvasc 5 mg daily. Patient has these side effects of medication: none He is adhering to a low sodium and low fat diet- only eats vegetables and fruits. Current exercise: stretches, weights for upper body, has issues with RLE so cardio is limited He does have life stressors with chronic pain, medical uncertainty with his pain and a relationship deterioration that is giving him stress.   Past Medical History:  Diagnosis Date  . Chronic back pain   . Hypertension   . Obesity    Family History  Problem Relation Age of Onset  . Colon cancer Neg Hx    Medications Current Outpatient Prescriptions on File Prior to Visit  Medication Sig Dispense Refill  . amLODipine (NORVASC) 5 MG tablet Take 1 tablet (5 mg total) by mouth daily. 30 tablet 1  . Ascorbic Acid (VITAMIN C PO) Take 1 tablet by mouth daily.    . cyclobenzaprine (FLEXERIL) 10 MG tablet Take 10 mg by mouth 3 (three) times daily as needed for muscle spasms.    Marland Kitchen oxymorphone (OPANA ER) 20 MG 12 hr tablet Take 20 mg by mouth every 12 (twelve) hours.     Allergies Allergies  Allergen Reactions  . Latex Rash   Review of Systems Cardiovascular: no chest pain Respiratory:  no shortness of breath  Exam BP (!) 170/108 (BP Location: Left Arm, Patient Position: Sitting, Cuff Size: Large)   Pulse 98   Temp 98.5 F (36.9 C) (Oral)   Ht 6\' 2"  (1.88 m)   Wt 251 lb 6.4 oz (114 kg)   SpO2 98%   BMI 32.28 kg/m  General:  well developed, well nourished, in no apparent distress Skin:  warm, no pallor or diaphoresis Eyes:  pupils equal and round, sclera anicteric without injection Neck: neck supple without adenopathy, thyromegaly, masses, or bruits   Lungs:  clear to auscultation, breath sounds equal bilaterally Psych: well oriented with normal range of affect and appropriate judgment/insight  Essential hypertension  Orders as above. Discussed adding medication vs increasing dose, he would like to stay on current dose and recheck.  F/u in 1 mo- bring Bp cuff to appt. The patient voiced understanding and agreement to the plan.  [place], DO 01/18/16  10:28 AM

## 2016-01-18 NOTE — Progress Notes (Signed)
Pre visit review using our clinic review tool, if applicable. No additional management support is needed unless otherwise documented below in the visit note. 

## 2016-01-18 NOTE — Patient Instructions (Signed)
Bring your BP cuff into your next appointment. Keep staying active. Bring in a log of your blood pressure readings.

## 2016-01-20 ENCOUNTER — Ambulatory Visit (INDEPENDENT_AMBULATORY_CARE_PROVIDER_SITE_OTHER): Payer: Medicare HMO | Admitting: Orthopaedic Surgery

## 2016-01-20 ENCOUNTER — Encounter (INDEPENDENT_AMBULATORY_CARE_PROVIDER_SITE_OTHER): Payer: Self-pay | Admitting: Orthopaedic Surgery

## 2016-01-20 VITALS — BP 154/99 | HR 82 | Ht 73.5 in | Wt 252.0 lb

## 2016-01-20 DIAGNOSIS — M7981 Nontraumatic hematoma of soft tissue: Secondary | ICD-10-CM | POA: Diagnosis not present

## 2016-01-20 DIAGNOSIS — M25561 Pain in right knee: Secondary | ICD-10-CM

## 2016-01-20 DIAGNOSIS — S72341S Displaced spiral fracture of shaft of right femur, sequela: Secondary | ICD-10-CM

## 2016-01-20 DIAGNOSIS — S7011XS Contusion of right thigh, sequela: Secondary | ICD-10-CM
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
## 2016-01-23 DIAGNOSIS — S7011XS Contusion of right thigh, sequela: Secondary | ICD-10-CM | POA: Insufficient documentation

## 2016-02-14 NOTE — Progress Notes (Deleted)
Subjective:   Dustin [name] is a 50 y.o. male who presents for an Initial Medicare Annual Wellness Visit.  Review of Systems  No ROS.  Medicare Wellness Visit.    Sleep patterns: {SX; SLEEP PATTERNS:18802::"feels rested on waking","does not get up to void","gets up *** times nightly to void","*** hours nightly"}.   Home Safety/Smoke Alarms:   Living environment; residence and Firearm Safety: {Rehab home environment / accessibility:30080::"no firearms","firearms stored safely"}. Seat Belt Safety/Bike Helmet:   Counseling:   Eye Exam-  Dental-  Male:   CCS-     PSA- No results found for: PSA     Objective:    There were no vitals filed for this visit. There is no height or weight on file to calculate BMI.  Current Medications (verified) Outpatient Encounter Prescriptions as of 02/15/2016  Medication Sig  . amLODipine (NORVASC) 5 MG tablet Take 1 tablet (5 mg total) by mouth daily.  . Ascorbic Acid (VITAMIN C PO) Take 1 tablet by mouth daily.  . cyclobenzaprine (FLEXERIL) 10 MG tablet Take 10 mg by mouth 3 (three) times daily as needed for muscle spasms.  Marland Kitchen oxyCODONE (ROXICODONE) 15 MG immediate release tablet Take 15 mg by mouth every 6 (six) hours as needed.  Marland Kitchen oxymorphone (OPANA ER) 20 MG 12 hr tablet Take 20 mg by mouth every 12 (twelve) hours.   No facility-administered encounter medications on file as of 02/15/2016.     Allergies (verified) Latex   History: Past Medical History:  Diagnosis Date  . Chronic back pain   . Hypertension   . Obesity    Past Surgical History:  Procedure Laterality Date  . CERVICAL FUSION    . CLOSED REDUCTION WRIST FRACTURE Left 04/01/2015   Procedure: CLOSED REDUCTION WRIST AND CASTING REPAIR LACERATION TO FINGERS TIMES 2;  Surgeon: Dorna Leitz, MD;  Location: [facility];  Service: Orthopedics;  Laterality: Left;  . FEMUR IM NAIL Right 04/01/2015   Procedure: INTRAMEDULLARY (IM) RETROGRADE FEMORAL NAILING ;  Surgeon: Dorna Leitz, MD;   Location: [facility];  Service: Orthopedics;  Laterality: Right;  . RESECTION DISTAL CLAVICAL  2009  . SHOULDER SURGERY     Family History  Problem Relation Age of Onset  . Colon cancer Neg Hx    Social History   Occupational History  . Not on file.   Social History Main Topics  . Smoking status: Never Smoker  . Smokeless tobacco: Never Used  . Alcohol use Yes     Comment: occasional   . Drug use: No  . Sexual activity: Not on file   Tobacco Counseling Counseling given: Not Answered   Activities of Daily Living In your present state of health, do you have any difficulty performing the following activities: 04/02/2015 04/02/2015  Hearing? - N  Vision? - N  Difficulty concentrating or making decisions? - N  Walking or climbing stairs? - N  Dressing or bathing? - N  Doing errands, shopping? N -  Some recent data might be hidden    Immunizations and Health Maintenance Immunization History  Administered Date(s) Administered  . Tdap 02/20/2007   Health Maintenance Due  Topic Date Due  . Hepatitis C Screening  Jul 19, 1965  . HIV Screening  11/02/1978  . COLONOSCOPY  11/01/2013    Patient Care Team: Shelda Pal, DO as PCP - General (Family Medicine) Serita Grit, PA-C as Physician Assistant (Pain Medicine) Dorna Leitz, MD as Consulting Physician (Orthopedic Surgery)  Indicate any recent Medical Services you  may have received from other than [facility] providers in the past year (date may be approximate).    Assessment:   This is a routine wellness examination for Dustin [name]. Physical assessment deferred to PCP.   Hearing/Vision screen No exam data present  Dietary issues and exercise activities discussed:   Diet (meal preparation, eat out, water intake, caffeinated beverages, dairy products, fruits and vegetables): {Desc; diets:16563} Breakfast: Lunch:  Dinner:      Goals    None     Depression Screen No flowsheet data found.  Fall Risk No flowsheet  data found.  Cognitive Function:        Screening Tests Health Maintenance  Topic Date Due  . Hepatitis C Screening  [birth year]  . HIV Screening  11/02/1978  . COLONOSCOPY  11/01/2013  . INFLUENZA VACCINE  05/19/2016 (Originally 09/20/2015)  . TETANUS/TDAP  02/19/2017        Plan:   ***  During the course of the visit Dustin [name] was educated and counseled about the following appropriate screening and preventive services:   Vaccines to include Pneumoccal, Influenza, Hepatitis B, Td, Zostavax, HCV  Colorectal cancer screening  Cardiovascular disease screening  Diabetes screening  Glaucoma screening  Nutrition counseling  Prostate cancer screening  Smoking cessation counseling  Patient Instructions (the written plan) were given to the patient.   Shela Nevin, [place]   02/14/2016

## 2016-02-14 NOTE — Progress Notes (Deleted)
Pre visit review using our clinic review tool, if applicable. No additional management support is needed unless otherwise documented below in the visit note. 

## 2016-02-15 ENCOUNTER — Encounter: Payer: Self-pay | Admitting: Family Medicine

## 2016-02-15 ENCOUNTER — Ambulatory Visit: Payer: Medicare HMO | Admitting: Family Medicine

## 2016-08-02 IMAGING — US US EXTREM LOW VENOUS*R*
1 series · 13 of 24 positions shown · non-contrast
Comparison: 04/12/2015

CLINICAL DATA: Right leg pain and swelling.



[Series 1: us extrem low venous*right* · 0.08mm/px · 34 acquisitions, 13 frames shown]
[im 1/34]
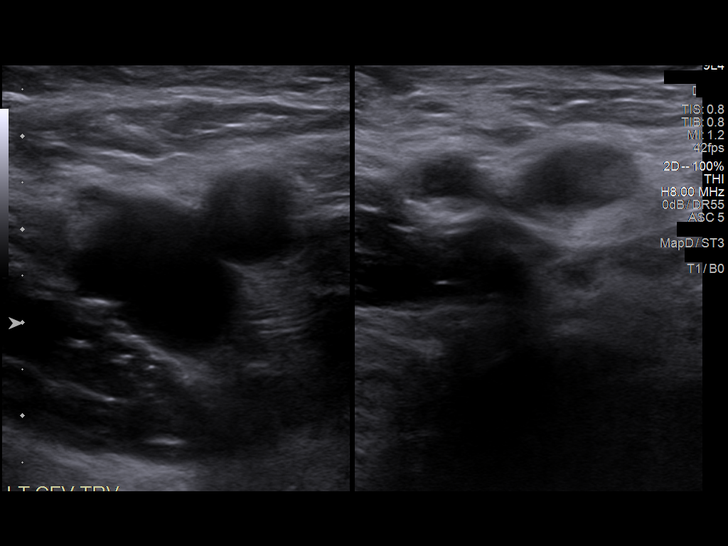
[im 3/34]
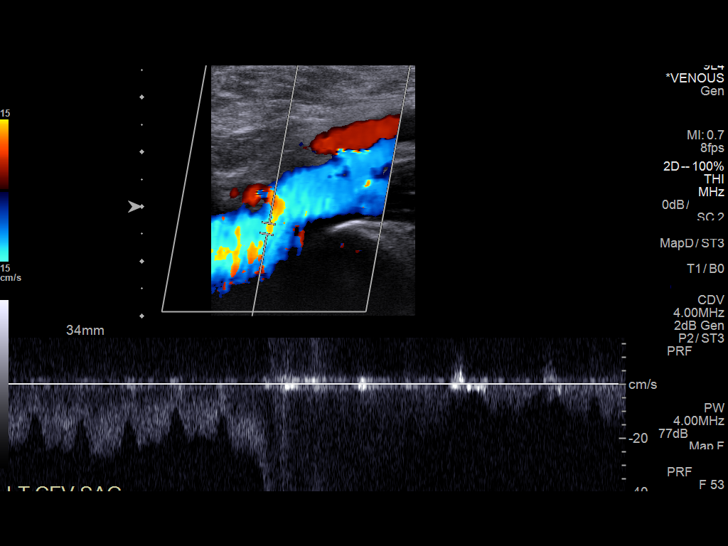
[im 6/34]
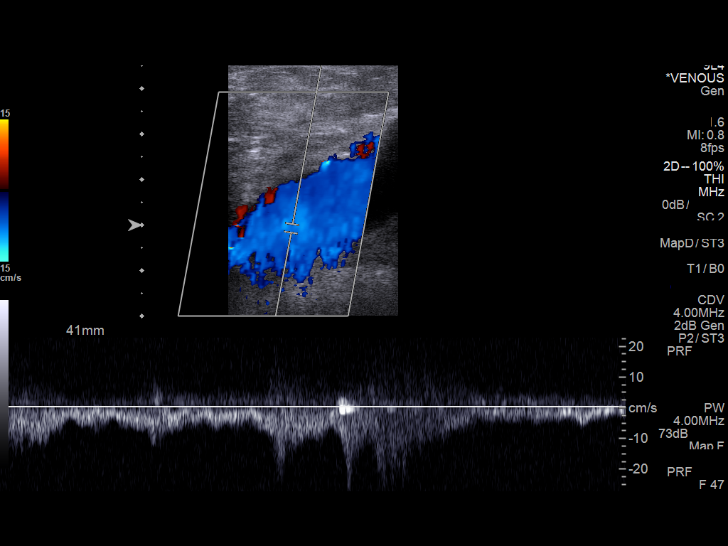
[im 11/34]
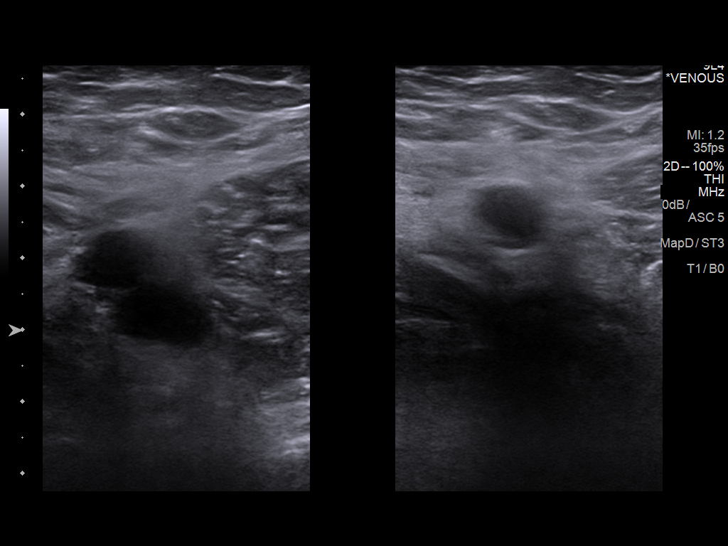
[im 13/34]
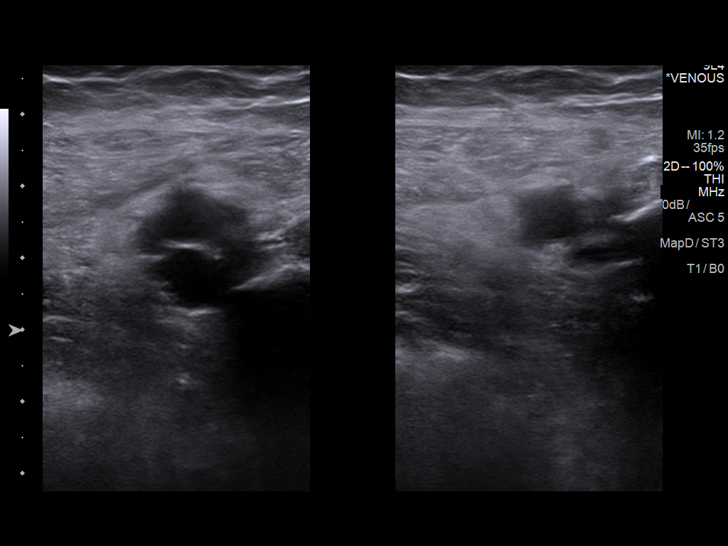
[im 15/34]
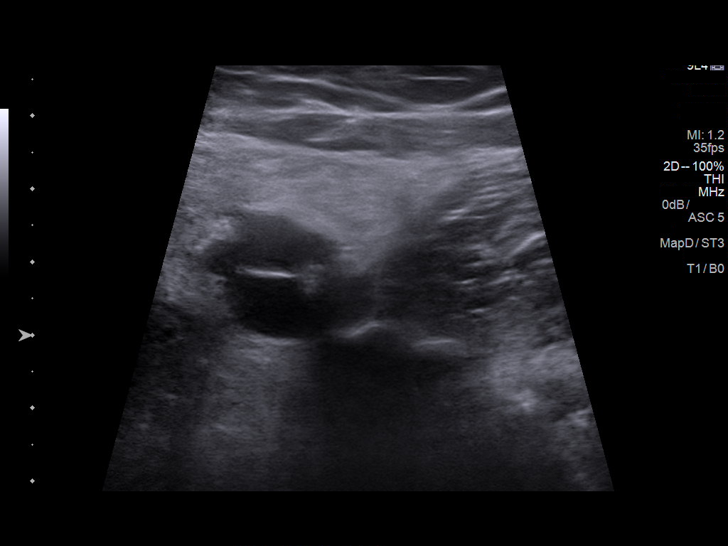
[im 18/34]
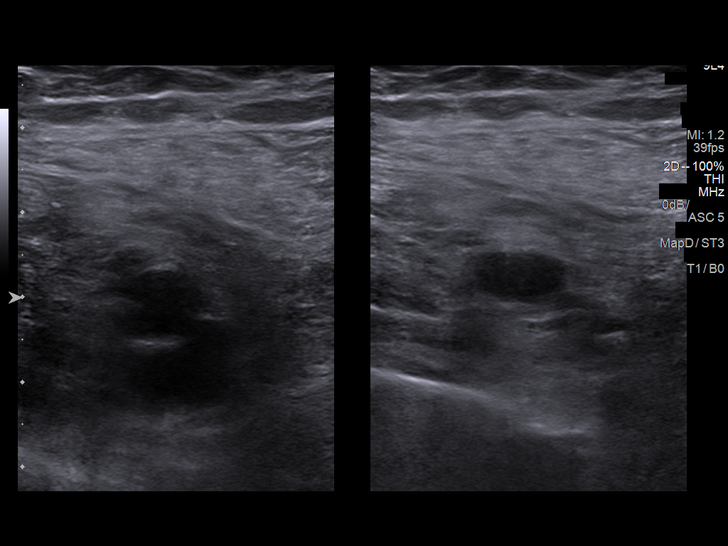
[im 19/34]
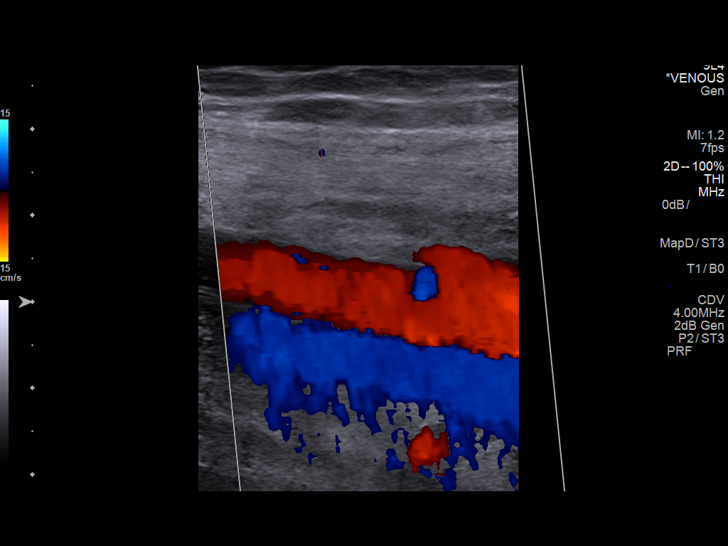
[im 22/34]
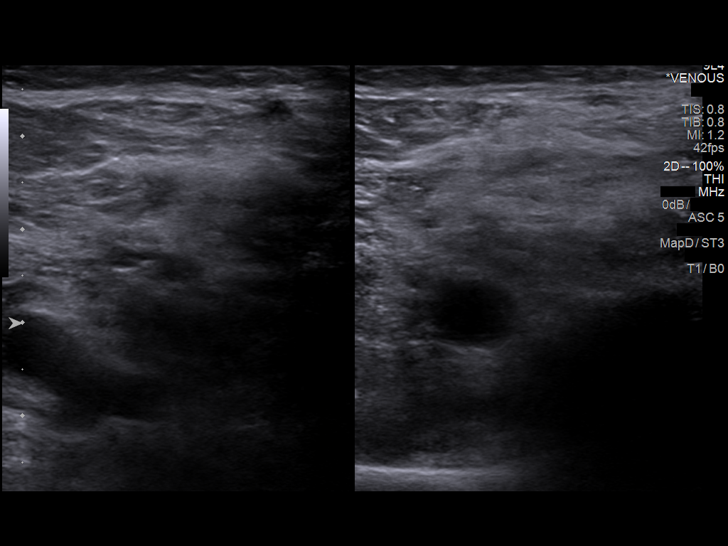
[im 25/34]
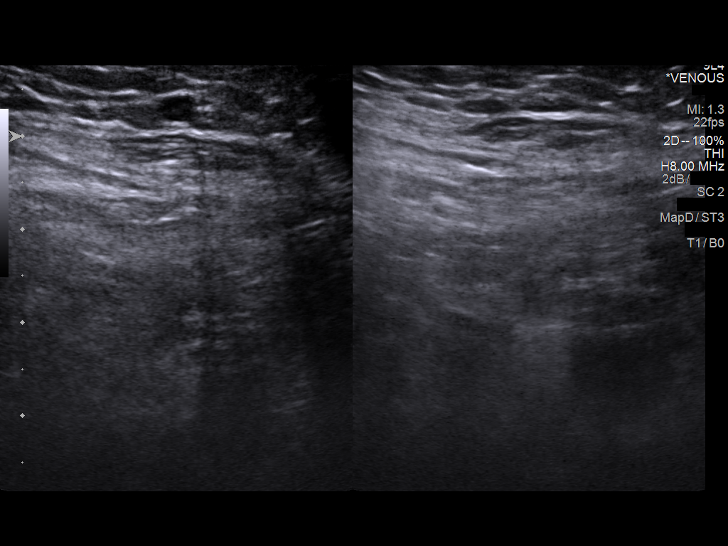
[im 29/34]
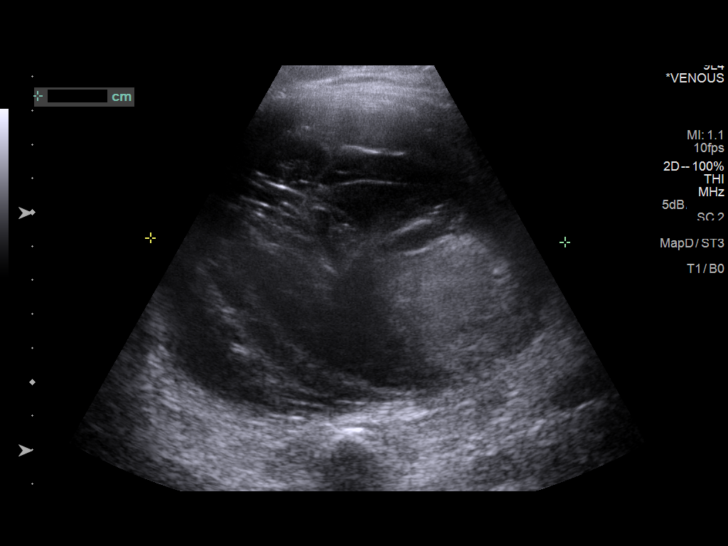
[im 32/34]
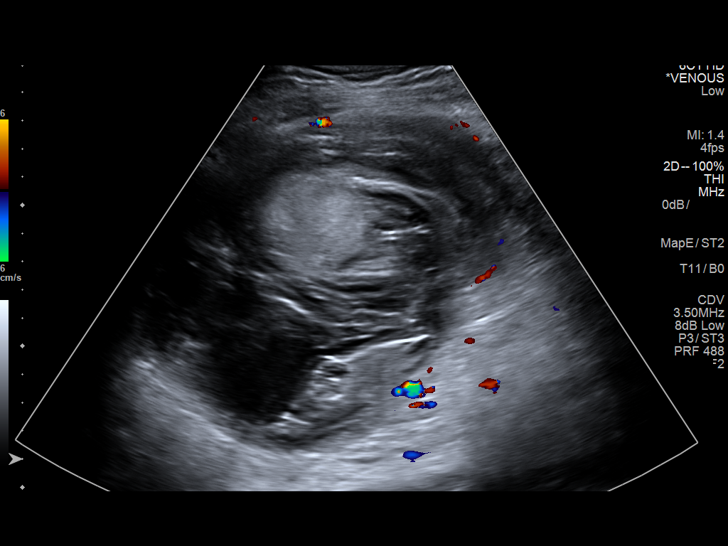
[im 34/34]
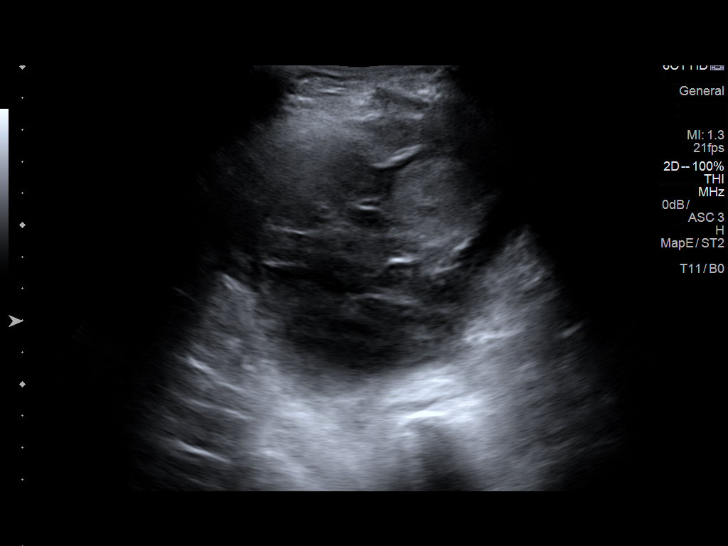

[13 of 24 positions shown; findings below may reference images not displayed]

FINDINGS: Contralateral Common Femoral Vein: Respiratory phasicity is normal
and symmetric with the symptomatic side. No evidence of thrombus.
Normal compressibility.

Common Femoral Vein: No evidence of thrombus. Normal
compressibility, respiratory phasicity and response to augmentation.

Saphenofemoral Junction: No evidence of thrombus. Normal
compressibility and flow on color Doppler imaging.

Profunda Femoral Vein: No evidence of thrombus. Normal
compressibility and flow on color Doppler imaging.

Femoral Vein: No evidence of thrombus. Normal compressibility,
respiratory phasicity and response to augmentation.

Popliteal Vein: No evidence of thrombus. Normal compressibility,
respiratory phasicity and response to augmentation.

Calf Veins: No evidence of thrombus. Normal compressibility and flow
on color Doppler imaging.

Superficial Great Saphenous Vein: No evidence of thrombus. Normal
compressibility and flow on color Doppler imaging.

Venous Reflux:  None.

Other Findings: There is a complex soft tissue mass of the anterior
thigh, measuring 8.3 x 12.2 x 14.2 cm. This is enlarged from
04/12/2015 when it measured 2.4 x 6.5 x 13.6 cm. The ultrasound
appearances of the mass are nonspecific. The hematoma could have
this appearance. However, the interval enlargement from fiber worry
is very concerning for possibility of a soft tissue neoplasm.
IMPRESSION: 1. No evidence of deep venous thrombosis.
2. Enlarging complex soft tissue mass of the anterior right thigh.
Cannot exclude a soft tissue neoplasm. Consider additional imaging
utilizing MRI without/with contrast.

## 2016-08-15 ENCOUNTER — Encounter (HOSPITAL_BASED_OUTPATIENT_CLINIC_OR_DEPARTMENT_OTHER): Payer: Self-pay | Admitting: *Deleted

## 2016-08-15 ENCOUNTER — Telehealth: Payer: Self-pay | Admitting: Family Medicine

## 2016-08-15 ENCOUNTER — Emergency Department (HOSPITAL_BASED_OUTPATIENT_CLINIC_OR_DEPARTMENT_OTHER)
Admission: EM | Admit: 2016-08-15 | Discharge: 2016-08-15 | Disposition: A | Discharge status: Home / Self Care | Payer: Medicare HMO | Attending: Emergency Medicine | Admitting: Emergency Medicine

## 2016-08-15 DIAGNOSIS — Z5321 Procedure and treatment not carried out due to patient leaving prior to being seen by health care provider: Secondary | ICD-10-CM | POA: Insufficient documentation

## 2016-08-15 DIAGNOSIS — R21 Rash and other nonspecific skin eruption: Secondary | ICD-10-CM | POA: Diagnosis not present

## 2016-08-15 DIAGNOSIS — R2241 Localized swelling, mass and lump, right lower limb: Secondary | ICD-10-CM | POA: Insufficient documentation
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
## 2016-08-15 NOTE — Telephone Encounter (Signed)
Following up on ED, his BP was very high and he cancelled his previous appt regarding this. Can we see if he would still like [place] to manage this? If not, I will take it we are parting ways and would like my name removed as his PCP. TY.
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
## 2016-08-15 NOTE — ED Notes (Signed)
Pt states he is leaving and will come back later today. Pt has several children with him that he needs to get to bed. Pt ambulatory out of department without difficulty.

## 2016-08-16 NOTE — Telephone Encounter (Signed)
Tried to reach the pt by phone, but unable to successfully do so.  The number is disconnected.  Called and spoke with the pt's mother and informed her that I was trying to reach the pt regarding his recent ED visit regarding his BP.  I explained to her that the pt's number was disconnected.  She stated that she will let the pt know that we are trying to get in touch with him and have him call us.//AB/CMA

## 2016-08-23 ENCOUNTER — Encounter (INDEPENDENT_AMBULATORY_CARE_PROVIDER_SITE_OTHER): Payer: Self-pay | Admitting: Specialist

## 2016-08-23 ENCOUNTER — Ambulatory Visit (INDEPENDENT_AMBULATORY_CARE_PROVIDER_SITE_OTHER): Payer: Medicare HMO

## 2016-08-23 ENCOUNTER — Ambulatory Visit (INDEPENDENT_AMBULATORY_CARE_PROVIDER_SITE_OTHER): Payer: Medicare HMO | Admitting: Specialist

## 2016-08-23 VITALS — BP 154/98 | HR 88 | Ht 73.5 in | Wt 252.0 lb

## 2016-08-23 DIAGNOSIS — Z969 Presence of functional implant, unspecified: Secondary | ICD-10-CM

## 2016-08-23 DIAGNOSIS — M79651 Pain in right thigh: Secondary | ICD-10-CM | POA: Diagnosis not present

## 2016-08-23 DIAGNOSIS — M84351D Stress fracture, right femur, subsequent encounter for fracture with routine healing: Secondary | ICD-10-CM

## 2016-08-23 MED ORDER — MELOXICAM 15 MG PO TABS: 15.0000 mg | ORAL_TABLET | Freq: Every day | ORAL | 6 refills | Status: DC
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
## 2016-08-23 NOTE — Patient Instructions (Addendum)
Use crutches or walker weight beaing as tolerated on the right leg. Will schedule an ultrasound on the right thigh to assess for a seroma or nonabsorbed hematoma. Obtain CTscan to assess for a seroma of hematoma post right femur fracture. I will call you with the results of the study and schedule removal of the nail at that time and whether the area of swelling can be addressed.

## 2016-08-27 ENCOUNTER — Telehealth (INDEPENDENT_AMBULATORY_CARE_PROVIDER_SITE_OTHER): Payer: Self-pay | Admitting: Specialist

## 2016-08-27 ENCOUNTER — Ambulatory Visit
Admission: RE | Admit: 2016-08-27 | Discharge: 2016-08-27 | Disposition: A | Discharge status: Home / Self Care | Payer: Medicare HMO | Source: Ambulatory Visit | Attending: Specialist | Admitting: Specialist

## 2016-08-27 DIAGNOSIS — M84351D Stress fracture, right femur, subsequent encounter for fracture with routine healing: Secondary | ICD-10-CM

## 2016-08-27 DIAGNOSIS — Z969 Presence of functional implant, unspecified: Secondary | ICD-10-CM

## 2016-08-27 NOTE — Telephone Encounter (Signed)
Please advise 

## 2016-08-27 NOTE — Telephone Encounter (Signed)
[name],Dustin [name] [phone]  Pasqualino stop by to see if a letter was ready for him to give to his Home Care Provider, stating what the newest restriction that Dr Louanne Skye has suggested. Please call when ready, he needs by the 11th if at allpossible

## 2016-08-29 ENCOUNTER — Encounter: Payer: Self-pay | Admitting: Family Medicine

## 2016-08-29 ENCOUNTER — Ambulatory Visit (INDEPENDENT_AMBULATORY_CARE_PROVIDER_SITE_OTHER): Payer: Medicare HMO | Admitting: Family Medicine

## 2016-08-29 ENCOUNTER — Encounter (HOSPITAL_COMMUNITY): Payer: Self-pay | Admitting: Emergency Medicine

## 2016-08-29 ENCOUNTER — Emergency Department (HOSPITAL_COMMUNITY)
Admission: EM | Admit: 2016-08-29 | Discharge: 2016-08-29 | Disposition: A | Discharge status: Home / Self Care | Payer: Medicare HMO | Attending: Physician Assistant | Admitting: Physician Assistant

## 2016-08-29 VITALS — BP 156/96 | HR 104 | Temp 98.0°F | Ht 73.5 in | Wt 245.8 lb

## 2016-08-29 DIAGNOSIS — M79602 Pain in left arm: Secondary | ICD-10-CM | POA: Diagnosis not present

## 2016-08-29 DIAGNOSIS — T148XXA Other injury of unspecified body region, initial encounter: Secondary | ICD-10-CM | POA: Diagnosis not present

## 2016-08-29 DIAGNOSIS — M25561 Pain in right knee: Secondary | ICD-10-CM | POA: Diagnosis not present

## 2016-08-29 DIAGNOSIS — Z91040 Latex allergy status: Secondary | ICD-10-CM | POA: Diagnosis not present

## 2016-08-29 DIAGNOSIS — M542 Cervicalgia: Secondary | ICD-10-CM | POA: Insufficient documentation

## 2016-08-29 DIAGNOSIS — M79601 Pain in right arm: Secondary | ICD-10-CM | POA: Diagnosis present

## 2016-08-29 DIAGNOSIS — I10 Essential (primary) hypertension: Secondary | ICD-10-CM

## 2016-08-29 DIAGNOSIS — G8929 Other chronic pain: Secondary | ICD-10-CM | POA: Insufficient documentation

## 2016-08-29 MED ORDER — LISINOPRIL 20 MG PO TABS: 20.0000 mg | ORAL_TABLET | Freq: Every day | ORAL | 3 refills | Status: DC

## 2016-08-29 NOTE — Discharge Instructions (Signed)
Please follow with your primary care doctor in the next 2 days for a check-up. They must obtain records for further management.  ° °Do not hesitate to return to the Emergency Department for any new, worsening or concerning symptoms.  ° °
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
## 2016-08-29 NOTE — Progress Notes (Signed)
Pre visit review using our clinic review tool, if applicable. No additional management support is needed unless otherwise documented below in the visit note. 

## 2016-08-29 NOTE — ED Notes (Signed)
See EDP secondary assessment.  
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
## 2016-08-29 NOTE — Patient Instructions (Signed)
Someone should be reaching out to you regarding home health.  Keep icing.
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
## 2016-08-31 ENCOUNTER — Other Ambulatory Visit: Payer: Self-pay | Admitting: Family Medicine

## 2016-09-05 ENCOUNTER — Telehealth: Payer: Self-pay | Admitting: Family Medicine

## 2016-09-05 ENCOUNTER — Other Ambulatory Visit: Payer: Self-pay | Admitting: Family Medicine

## 2016-09-05 ENCOUNTER — Encounter: Payer: Self-pay | Admitting: Family Medicine

## 2016-09-05 DIAGNOSIS — M25561 Pain in right knee: Secondary | ICD-10-CM

## 2016-09-05 NOTE — Telephone Encounter (Signed)
Printed and signed. Where do we need to send it? TY.

## 2016-09-05 NOTE — Telephone Encounter (Signed)
Pt called in to make PCP aware that he no longer need (referral) a RN to come to the home he already have a caretaker from [facility] private home care that assist.   Pt would like to just have a note from provider stating that he need assistance per there visit conversation

## 2016-09-05 NOTE — Telephone Encounter (Signed)
Please advise.//AB/CMA 

## 2016-09-07 NOTE — Telephone Encounter (Signed)
Called and spoke with the pt regarding the letter for home care assist.  Informed the pt that the letter is ready and I need to know where to send the letter.  Pt stated that the letter will need to go to [place].  Called and spoke with [facility] to get a fax number to fax the letter.   Letter was faxed to [facility] at [phone]).  Their phone number is [phone]).  Confirmation received.//AB/CMA

## 2016-09-11 ENCOUNTER — Other Ambulatory Visit (INDEPENDENT_AMBULATORY_CARE_PROVIDER_SITE_OTHER): Payer: Self-pay | Admitting: Specialist

## 2016-09-11 DIAGNOSIS — Z969 Presence of functional implant, unspecified: Secondary | ICD-10-CM | POA: Insufficient documentation

## 2016-09-11 DIAGNOSIS — T792XXS Traumatic secondary and recurrent hemorrhage and seroma, sequela: Secondary | ICD-10-CM | POA: Insufficient documentation

## 2016-09-14 NOTE — Progress Notes (Signed)
Yes a blue sheet has been filled out. jen

## 2016-10-04 ENCOUNTER — Encounter (HOSPITAL_COMMUNITY): Payer: Self-pay

## 2016-10-04 ENCOUNTER — Encounter (HOSPITAL_COMMUNITY)
Admission: RE | Admit: 2016-10-04 | Discharge: 2016-10-04 | Disposition: A | Discharge status: Home / Self Care | Payer: Medicare HMO | Source: Ambulatory Visit | Attending: Specialist | Admitting: Specialist

## 2016-10-04 DIAGNOSIS — F4310 Post-traumatic stress disorder, unspecified: Secondary | ICD-10-CM | POA: Insufficient documentation

## 2016-10-04 DIAGNOSIS — Z01812 Encounter for preprocedural laboratory examination: Secondary | ICD-10-CM | POA: Diagnosis not present

## 2016-10-04 DIAGNOSIS — I119 Hypertensive heart disease without heart failure: Secondary | ICD-10-CM | POA: Insufficient documentation

## 2016-10-04 DIAGNOSIS — Z01818 Encounter for other preprocedural examination: Secondary | ICD-10-CM | POA: Insufficient documentation

## 2016-10-04 HISTORY — DX: Unspecified osteoarthritis, unspecified site: M19.90

## 2016-10-04 HISTORY — DX: Dyspnea, unspecified: R06.00

## 2016-10-04 HISTORY — DX: Post-traumatic stress disorder, unspecified: F43.10

## 2016-10-04 LAB — URINALYSIS, ROUTINE W REFLEX MICROSCOPIC
BILIRUBIN URINE: NEGATIVE
Glucose, UA: NEGATIVE mg/dL
KETONES UR: NEGATIVE mg/dL
LEUKOCYTES UA: NEGATIVE
Nitrite: NEGATIVE
Protein, ur: NEGATIVE mg/dL
SPECIFIC GRAVITY, URINE: 1.006 (ref 1.005–1.030)
pH: 6 (ref 5.0–8.0)

## 2016-10-04 LAB — CBC
HEMATOCRIT: 41.7 % (ref 39.0–52.0)
HEMOGLOBIN: 13.5 g/dL (ref 13.0–17.0)
MCH: 28 pg (ref 26.0–34.0)
MCHC: 32.4 g/dL (ref 30.0–36.0)
MCV: 86.3 fL (ref 78.0–100.0)
PLATELETS: 247 10*3/uL (ref 150–400)
RBC: 4.83 MIL/uL (ref 4.22–5.81)
RDW: 13.6 % (ref 11.5–15.5)
WBC: 8.2 10*3/uL (ref 4.0–10.5)

## 2016-10-04 LAB — COMPREHENSIVE METABOLIC PANEL
ALK PHOS: 86 U/L (ref 38–126)
ALT: 12 U/L — AB (ref 17–63)
AST: 17 U/L (ref 15–41)
Albumin: 3.6 g/dL (ref 3.5–5.0)
Anion gap: 8 (ref 5–15)
BILIRUBIN TOTAL: 0.7 mg/dL (ref 0.3–1.2)
BUN: 5 mg/dL — AB (ref 6–20)
CALCIUM: 9 mg/dL (ref 8.9–10.3)
CHLORIDE: 103 mmol/L (ref 101–111)
CO2: 27 mmol/L (ref 22–32)
CREATININE: 0.92 mg/dL (ref 0.61–1.24)
Glucose, Bld: 104 mg/dL — ABNORMAL HIGH (ref 65–99)
Potassium: 3.6 mmol/L (ref 3.5–5.1)
Sodium: 138 mmol/L (ref 135–145)
Total Protein: 7.2 g/dL (ref 6.5–8.1)

## 2016-10-04 LAB — APTT: aPTT: 29 seconds (ref 24–36)

## 2016-10-04 LAB — PROTIME-INR
INR: 0.93
PROTHROMBIN TIME: 12.4 s (ref 11.4–15.2)

## 2016-10-04 NOTE — Progress Notes (Signed)
Dustin [name] denies ches pain or shortness of breath.  BP today is 160/107, patient stated that he has not been taking blood pressure medication.  I informed that surgery can be cancelled the day of surgery if blood pressure is too high.  Patient reports that he wants surgery and will start taking medication and he can check blood pressure at home.  I aske d Josephine Cables, NT to recheck blood pressure prior to leaving PAT.

## 2016-10-04 NOTE — Pre-Procedure Instructions (Signed)
Dustin [name]  10/04/2016      Your procedure is scheduled on Tuesday, August 21.  Report to [facility] Admitting at 5:30  A.M.                  Your surgery or procedure is scheduled for 7:30 AM   Call this number if you have problems the morning of surgery: [phone]- pre- op desk         For any other questions, please call [phone], Monday - Friday 8 AM - 4 PM.   Do not eat food or drink liquids after midnight Sunday, August 20.  Take these medicines the morning of surgery with A SIP OF WATER -   STOP taking Aspirin, Aspirin Products (Goody Powder, Excedrin Migraine), Ibuprofen (Advil), Naproxen (Aleve), Vitamins and Herbal Products (ie Fish Oil) Special instructions:  [place]- Preparing For Surgery  Before surgery, you can play an important role. Because skin is not sterile, your skin needs to be as free of germs as possible. You can reduce the number of germs on your skin by washing with CHG (chlorahexidine gluconate) Soap before surgery.  CHG is an antiseptic cleaner which kills germs and bonds with the skin to continue killing germs even after washing.  Please do not use if you have an allergy to CHG or antibacterial soaps. If your skin becomes reddened/irritated stop using the CHG.  Do not shave (including legs and underarms) for at least 48 hours prior to first CHG shower. It is OK to shave your face.  Please follow these instructions carefully.   1. Shower the NIGHT BEFORE SURGERY and the MORNING OF SURGERY with CHG.   2. If you chose to wash your hair, wash your hair first as usual with your normal shampoo.  3. After you shampoo, rinse your hair and body thoroughly to remove the shampoo.  4. Use CHG as you would any other liquid soap. You can apply CHG directly to the skin and wash gently with a scrungie or a clean washcloth.   5. Apply the CHG Soap to your body ONLY FROM THE NECK DOWN.  Do not use on open wounds or open sores. Avoid contact  with your eyes, ears, mouth and genitals (private parts). Wash genitals (private parts) with your normal soap.  6. Wash thoroughly, paying special attention to the area where your surgery will be performed.  7. Thoroughly rinse your body with warm water from the neck down.  8. DO NOT shower/wash with your normal soap after using and rinsing off the CHG Soap.  9. Pat yourself dry with a CLEAN TOWEL.   10. Wear CLEAN PAJAMAS   11. Place CLEAN SHEETS on your bed the night of your first shower and DO NOT SLEEP WITH PETS.  Day of Surgery: Shower as Above Do not apply any deodorants/lotions, powder or cologne. Please wear clean clothes to the hospital/surgery center.    Do not wear jewelry, make-up or nail polish.  Do not shave 48 hours prior to surgery.  Men may shave face and neck.  Do not bring valuables to the hospital.  [facility] is not responsible for any belongings or valuables.  Contacts, dentures or bridgework may not be worn into surgery.  Leave your suitcase in the car.  After surgery it may be brought to your room.  For patients admitted to the hospital, discharge time will be determined by your treatment team.  Patients discharged the day of  surgery will not be allowed to drive home.   Name and phone number of your driver:  -  Please read over the following fact sheets that you were given: Pain Booklet, Coughing and Deep Breathing Surgical Site Infections.

## 2016-10-04 NOTE — Pre-Procedure Instructions (Signed)
Dustin [name]  10/04/2016      Your procedure is scheduled on Tuesday, August 21.  Report to [facility] Admitting at 5:30  A.M.                  Your surgery or procedure is scheduled for 7:30 AM   Call this number if you have problems the morning of surgery: [phone]- pre- op desk         For any other questions, please call [phone], Monday - Friday 8 AM - 4 PM.   Do not eat food or drink liquids after midnight Sunday, August 20.  Take these medicines the morning of surgery with A SIP OF WATER -oxymorphone (OPANA ER).                    Take if needxed cyclobenzaprine (FLEXERIL) , Oxycodone.     STOP taking Aspirin, Aspirin Products (Goody Powder, Excedrin Migraine), Ibuprofen (Advil), Naproxen (Aleve), Vitamins and Herbal Products (ie Fish Oil) Special instructions:  [place]- Preparing For Surgery  Before surgery, you can play an important role. Because skin is not sterile, your skin needs to be as free of germs as possible. You can reduce the number of germs on your skin by washing with CHG (chlorahexidine gluconate) Soap before surgery.  CHG is an antiseptic cleaner which kills germs and bonds with the skin to continue killing germs even after washing.  Please do not use if you have an allergy to CHG or antibacterial soaps. If your skin becomes reddened/irritated stop using the CHG.  Do not shave (including legs and underarms) for at least 48 hours prior to first CHG shower. It is OK to shave your face.  Please follow these instructions carefully.   1. Shower the NIGHT BEFORE SURGERY and the MORNING OF SURGERY with CHG.   2. If you chose to wash your hair, wash your hair first as usual with your normal shampoo.  3. After you shampoo, rinse your hair and body thoroughly to remove the shampoo.  4. Use CHG as you would any other liquid soap. You can apply CHG directly to the skin and wash gently with a scrungie or a clean washcloth.   5. Apply the CHG  Soap to your body ONLY FROM THE NECK DOWN.  Do not use on open wounds or open sores. Avoid contact with your eyes, ears, mouth and genitals (private parts). Wash genitals (private parts) with your normal soap.  6. Wash thoroughly, paying special attention to the area where your surgery will be performed.  7. Thoroughly rinse your body with warm water from the neck down.  8. DO NOT shower/wash with your normal soap after using and rinsing off the CHG Soap.  9. Pat yourself dry with a CLEAN TOWEL.   10. Wear CLEAN PAJAMAS   11. Place CLEAN SHEETS on your bed the night of your first shower and DO NOT SLEEP WITH PETS.  Day of Surgery: Shower as Above Do not apply any deodorants/lotions, powder or cologne. Please wear clean clothes to the hospital/surgery center.    Do not wear jewelry, make-up or nail polish.  Do not shave 48 hours prior to surgery.  Men may shave face and neck.  Do not bring valuables to the hospital.  [facility] is not responsible for any belongings or valuables.  Contacts, dentures or bridgework may not be worn into surgery.  Leave your suitcase in the car.  After  surgery it may be brought to your room.  For patients admitted to the hospital, discharge time will be determined by your treatment team.  Patients discharged the day of surgery will not be allowed to drive home.   Name and phone number of your driver:  -  Please read over the following fact sheets that you were given: Pain Booklet, Coughing and Deep Breathing Surgical Site Infections.

## 2016-10-05 ENCOUNTER — Telehealth (INDEPENDENT_AMBULATORY_CARE_PROVIDER_SITE_OTHER): Payer: Self-pay | Admitting: Specialist
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
## 2016-10-05 NOTE — Telephone Encounter (Signed)
I left voicemail for [name] to return my call.

## 2016-10-05 NOTE — Telephone Encounter (Signed)
Can one of you ladies look into this for Dr. Louanne Skye?  Thank you!

## 2016-10-05 NOTE — Telephone Encounter (Signed)
Dustin [name], Patient came in to state that his pain mgmt team need to speak with [name] staff in regards to pain mgmt since he is having surgery of Tuesday. Needs a phone call to Dr. Rochele Pages Asst Lenna Sciara [phone] asap.

## 2016-10-05 NOTE — Progress Notes (Signed)
Anesthesia Chart Review:  Pt is a 51 year male scheduled for removal of IM R femur nail and excision of hematoma cavity R thigh on 10/09/2016 with [name], MD  PMH includes:  HTN, PTSD. Never smoker. BMI 31. S/p R femur IM nail, L wrist closed reduction 04/01/15.    Medications include: lisinopril (pt has not been taking)  BP (!) 160/107 Comment: taken manually  Pulse 88   Temp 37.1 C   Resp 18   Ht 6' 1.5" (1.867 m)   Wt 238 lb 9.6 oz (108.2 kg)   SpO2 98%   BMI 31.05 kg/m    Preoperative labs reviewed.    EKG 10/04/16: NSR. LVH. Nonspecific T wave abnormality  HTN uncontrolled at PAT.  Pt reports he has not been taking his lisinopril.  PAT RN instructed pt to restart med and notified him surgery could be cancelled if BP too high. I notified Malachy Mood in Dr. Otho Ket office.   If BP acceptable DOS, I anticipate pt can proceed as scheduled.   Willeen Cass, FNP-BC [facility]/Anesthesiology Phone: [phone] 10/05/2016 3:09 PM

## 2016-10-05 NOTE — Telephone Encounter (Signed)
Ilsa Iha states he is going to be out next week and would like to be sure that you follow up with Dr. Louanne Skye on this message. Thanks.

## 2016-10-08 ENCOUNTER — Telehealth: Payer: Self-pay | Admitting: Family Medicine

## 2016-10-08 MED ORDER — CEFAZOLIN SODIUM-DEXTROSE 2-4 GM/100ML-% IV SOLN
2.0000 g | INTRAVENOUS | Status: AC
  Administered 2016-10-09: 2 g via INTRAVENOUS
  Filled 2016-10-08: qty 100

## 2016-10-08 NOTE — Telephone Encounter (Signed)
TY. Noted.

## 2016-10-08 NOTE — Telephone Encounter (Signed)
noted 

## 2016-10-08 NOTE — Telephone Encounter (Signed)
Relation to pt: self  Call back [phone]    FYI-  Reason for call:  Patient will be having surgery tomorrow 10/09/16 at 7:30am at [facility] due to removing titanium rod from right leg, patient wanted to make PCP aware

## 2016-10-09 ENCOUNTER — Encounter (HOSPITAL_COMMUNITY): Payer: Self-pay

## 2016-10-09 ENCOUNTER — Observation Stay (HOSPITAL_COMMUNITY)
Admission: RE | Admit: 2016-10-09 | Discharge: 2016-10-10 | Disposition: A | Discharge status: Home / Self Care | Payer: Medicare HMO | Source: Ambulatory Visit | Attending: Specialist | Admitting: Specialist

## 2016-10-09 ENCOUNTER — Ambulatory Visit (HOSPITAL_COMMUNITY): Payer: Medicare HMO | Admitting: Anesthesiology

## 2016-10-09 ENCOUNTER — Ambulatory Visit (HOSPITAL_COMMUNITY): Payer: Medicare HMO

## 2016-10-09 ENCOUNTER — Encounter (HOSPITAL_COMMUNITY)
Admission: RE | Disposition: A | Discharge status: Home / Self Care | Payer: Self-pay | Source: Ambulatory Visit | Attending: Specialist

## 2016-10-09 ENCOUNTER — Ambulatory Visit (HOSPITAL_COMMUNITY): Payer: Medicare HMO | Admitting: Emergency Medicine

## 2016-10-09 DIAGNOSIS — Z419 Encounter for procedure for purposes other than remedying health state, unspecified: Secondary | ICD-10-CM

## 2016-10-09 DIAGNOSIS — S7011XA Contusion of right thigh, initial encounter: Secondary | ICD-10-CM | POA: Diagnosis not present

## 2016-10-09 DIAGNOSIS — Z7982 Long term (current) use of aspirin: Secondary | ICD-10-CM | POA: Insufficient documentation

## 2016-10-09 DIAGNOSIS — F4310 Post-traumatic stress disorder, unspecified: Secondary | ICD-10-CM | POA: Insufficient documentation

## 2016-10-09 DIAGNOSIS — M549 Dorsalgia, unspecified: Secondary | ICD-10-CM | POA: Diagnosis not present

## 2016-10-09 DIAGNOSIS — T792XXS Traumatic secondary and recurrent hemorrhage and seroma, sequela: Secondary | ICD-10-CM

## 2016-10-09 DIAGNOSIS — Y793 Surgical instruments, materials and orthopedic devices (including sutures) associated with adverse incidents: Secondary | ICD-10-CM | POA: Diagnosis not present

## 2016-10-09 DIAGNOSIS — E669 Obesity, unspecified: Secondary | ICD-10-CM | POA: Diagnosis not present

## 2016-10-09 DIAGNOSIS — Z6831 Body mass index (BMI) 31.0-31.9, adult: Secondary | ICD-10-CM | POA: Insufficient documentation

## 2016-10-09 DIAGNOSIS — D2370 Other benign neoplasm of skin of unspecified lower limb, including hip: Secondary | ICD-10-CM | POA: Diagnosis not present

## 2016-10-09 DIAGNOSIS — T792XXA Traumatic secondary and recurrent hemorrhage and seroma, initial encounter: Secondary | ICD-10-CM | POA: Diagnosis not present

## 2016-10-09 DIAGNOSIS — Z79899 Other long term (current) drug therapy: Secondary | ICD-10-CM | POA: Diagnosis not present

## 2016-10-09 DIAGNOSIS — I10 Essential (primary) hypertension: Secondary | ICD-10-CM | POA: Diagnosis not present

## 2016-10-09 DIAGNOSIS — T84194A Other mechanical complication of internal fixation device of right femur, initial encounter: Principal | ICD-10-CM | POA: Insufficient documentation

## 2016-10-09 DIAGNOSIS — G8929 Other chronic pain: Secondary | ICD-10-CM | POA: Diagnosis not present

## 2016-10-09 DIAGNOSIS — Z472 Encounter for removal of internal fixation device: Secondary | ICD-10-CM | POA: Diagnosis not present

## 2016-10-09 DIAGNOSIS — Z969 Presence of functional implant, unspecified: Secondary | ICD-10-CM

## 2016-10-09 DIAGNOSIS — R2681 Unsteadiness on feet: Secondary | ICD-10-CM | POA: Diagnosis not present

## 2016-10-09 DIAGNOSIS — T8484XA Pain due to internal orthopedic prosthetic devices, implants and grafts, initial encounter: Secondary | ICD-10-CM

## 2016-10-09 HISTORY — PX: HEMATOMA EVACUATION: SHX5118

## 2016-10-09 HISTORY — PX: HARDWARE REMOVAL: SHX979

## 2016-10-09 LAB — CBC
HEMATOCRIT: 37.4 % — AB (ref 39.0–52.0)
HEMOGLOBIN: 12.1 g/dL — AB (ref 13.0–17.0)
MCH: 27.8 pg (ref 26.0–34.0)
MCHC: 32.4 g/dL (ref 30.0–36.0)
MCV: 86 fL (ref 78.0–100.0)
Platelets: 266 10*3/uL (ref 150–400)
RBC: 4.35 MIL/uL (ref 4.22–5.81)
RDW: 13.7 % (ref 11.5–15.5)
WBC: 10.4 10*3/uL (ref 4.0–10.5)

## 2016-10-09 LAB — CREATININE, SERUM
Creatinine, Ser: 0.94 mg/dL (ref 0.61–1.24)
GFR calc Af Amer: >60 mL/min (ref >60)
GFR calc non Af Amer: >60 mL/min (ref >60)

## 2016-10-09 SURGERY — REMOVAL, HARDWARE
Anesthesia: General | Laterality: Right

## 2016-10-09 MED ORDER — ENOXAPARIN SODIUM 40 MG/0.4ML ~~LOC~~ SOLN
40.0000 mg | SUBCUTANEOUS | Status: DC
  Filled 2016-10-09: qty 0.4

## 2016-10-09 MED ORDER — POLYETHYLENE GLYCOL 3350 17 G PO PACK: 17.0000 g | PACK | Freq: Every day | ORAL | Status: DC | PRN

## 2016-10-09 MED ORDER — ONDANSETRON HCL 4 MG/2ML IJ SOLN
INTRAMUSCULAR | Status: DC | PRN
  Administered 2016-10-09: 4 mg via INTRAVENOUS

## 2016-10-09 MED ORDER — LABETALOL HCL 5 MG/ML IV SOLN
INTRAVENOUS | Status: AC
  Filled 2016-10-09: qty 4

## 2016-10-09 MED ORDER — MORPHINE SULFATE (PF) 4 MG/ML IV SOLN
INTRAVENOUS | Status: AC
  Administered 2016-10-09: 2 mg
  Filled 2016-10-09: qty 1

## 2016-10-09 MED ORDER — MIDAZOLAM HCL 2 MG/2ML IJ SOLN
INTRAMUSCULAR | Status: AC
  Filled 2016-10-09: qty 2

## 2016-10-09 MED ORDER — LACTATED RINGERS IV SOLN
INTRAVENOUS | Status: DC | PRN
  Administered 2016-10-09 (×2): via INTRAVENOUS

## 2016-10-09 MED ORDER — MORPHINE SULFATE (PF) 4 MG/ML IV SOLN
2.0000 mg | INTRAVENOUS | Status: DC | PRN
  Administered 2016-10-09 (×3): 2 mg via INTRAVENOUS
  Filled 2016-10-09 (×3): qty 1

## 2016-10-09 MED ORDER — 0.9 % SODIUM CHLORIDE (POUR BTL) OPTIME
TOPICAL | Status: DC | PRN
  Administered 2016-10-09: 1000 mL

## 2016-10-09 MED ORDER — OXYCODONE HCL 5 MG PO TABS
ORAL_TABLET | ORAL | Status: AC
  Administered 2016-10-09: 10 mg via ORAL
  Filled 2016-10-09: qty 1

## 2016-10-09 MED ORDER — FERROUS SULFATE 325 (65 FE) MG PO TABS
325.0000 mg | ORAL_TABLET | Freq: Two times a day (BID) | ORAL | Status: DC
  Administered 2016-10-10: 325 mg via ORAL
  Filled 2016-10-09 (×2): qty 1

## 2016-10-09 MED ORDER — OXYCODONE HCL 5 MG PO TABS
ORAL_TABLET | ORAL | Status: AC
  Filled 2016-10-09: qty 1

## 2016-10-09 MED ORDER — PROPOFOL 10 MG/ML IV BOLUS
INTRAVENOUS | Status: AC
  Filled 2016-10-09: qty 20

## 2016-10-09 MED ORDER — SODIUM CHLORIDE 0.9 % IV SOLN
INTRAVENOUS | Status: DC
  Administered 2016-10-09 – 2016-10-10 (×2): via INTRAVENOUS

## 2016-10-09 MED ORDER — METOCLOPRAMIDE HCL 5 MG PO TABS: 5.0000 mg | ORAL_TABLET | Freq: Three times a day (TID) | ORAL | Status: DC | PRN

## 2016-10-09 MED ORDER — HYDROMORPHONE HCL 1 MG/ML IJ SOLN
INTRAMUSCULAR | Status: AC
  Filled 2016-10-09: qty 1

## 2016-10-09 MED ORDER — OXYCODONE HCL 5 MG PO TABS: 5.0000 mg | ORAL_TABLET | Freq: Once | ORAL | Status: DC | PRN

## 2016-10-09 MED ORDER — MORPHINE SULFATE (PF) 2 MG/ML IV SOLN: 2.0000 mg | INTRAVENOUS | Status: DC | PRN

## 2016-10-09 MED ORDER — CHLORHEXIDINE GLUCONATE 4 % EX LIQD: 60.0000 mL | Freq: Once | CUTANEOUS | Status: DC

## 2016-10-09 MED ORDER — KETOROLAC TROMETHAMINE 15 MG/ML IJ SOLN
INTRAMUSCULAR | Status: AC
  Administered 2016-10-09: 15 mg via INTRAVENOUS
  Filled 2016-10-09: qty 1

## 2016-10-09 MED ORDER — FENTANYL CITRATE (PF) 250 MCG/5ML IJ SOLN
INTRAMUSCULAR | Status: AC
  Filled 2016-10-09: qty 5

## 2016-10-09 MED ORDER — CYCLOBENZAPRINE HCL 10 MG PO TABS
10.0000 mg | ORAL_TABLET | Freq: Three times a day (TID) | ORAL | Status: DC | PRN
  Administered 2016-10-09 – 2016-10-10 (×2): 10 mg via ORAL
  Filled 2016-10-09 (×2): qty 1

## 2016-10-09 MED ORDER — BUPIVACAINE-EPINEPHRINE 0.25% -1:200000 IJ SOLN
INTRAMUSCULAR | Status: DC | PRN
  Administered 2016-10-09: 10 mL

## 2016-10-09 MED ORDER — FLEET ENEMA 7-19 GM/118ML RE ENEM: 1.0000 | ENEMA | Freq: Once | RECTAL | Status: DC | PRN

## 2016-10-09 MED ORDER — OXYCODONE HCL 5 MG/5ML PO SOLN: 5.0000 mg | Freq: Once | ORAL | Status: DC | PRN

## 2016-10-09 MED ORDER — BUPIVACAINE-EPINEPHRINE (PF) 0.25% -1:200000 IJ SOLN
INTRAMUSCULAR | Status: AC
  Filled 2016-10-09: qty 30

## 2016-10-09 MED ORDER — ONDANSETRON HCL 4 MG PO TABS: 4.0000 mg | ORAL_TABLET | Freq: Four times a day (QID) | ORAL | Status: DC | PRN

## 2016-10-09 MED ORDER — OXYCODONE HCL 5 MG PO TABS
10.0000 mg | ORAL_TABLET | ORAL | Status: DC | PRN
  Administered 2016-10-09 – 2016-10-10 (×6): 10 mg via ORAL
  Filled 2016-10-09 (×5): qty 2

## 2016-10-09 MED ORDER — MORPHINE SULFATE (PF) 4 MG/ML IV SOLN: 4.0000 mg | INTRAVENOUS | Status: DC | PRN

## 2016-10-09 MED ORDER — ROCURONIUM BROMIDE 100 MG/10ML IV SOLN
INTRAVENOUS | Status: DC | PRN
  Administered 2016-10-09: 60 mg via INTRAVENOUS

## 2016-10-09 MED ORDER — BISACODYL 5 MG PO TBEC: 5.0000 mg | DELAYED_RELEASE_TABLET | Freq: Every day | ORAL | Status: DC | PRN

## 2016-10-09 MED ORDER — SUGAMMADEX SODIUM 200 MG/2ML IV SOLN
INTRAVENOUS | Status: DC | PRN
  Administered 2016-10-09: 300 mg via INTRAVENOUS

## 2016-10-09 MED ORDER — PROPOFOL 10 MG/ML IV BOLUS
INTRAVENOUS | Status: DC | PRN
  Administered 2016-10-09: 200 mg via INTRAVENOUS

## 2016-10-09 MED ORDER — LIDOCAINE HCL (CARDIAC) 20 MG/ML IV SOLN
INTRAVENOUS | Status: DC | PRN
  Administered 2016-10-09: 100 mg via INTRAVENOUS

## 2016-10-09 MED ORDER — MORPHINE SULFATE ER 15 MG PO TBCR
30.0000 mg | EXTENDED_RELEASE_TABLET | Freq: Two times a day (BID) | ORAL | Status: DC
  Administered 2016-10-09 – 2016-10-10 (×2): 30 mg via ORAL
  Filled 2016-10-09 (×2): qty 2

## 2016-10-09 MED ORDER — CEFAZOLIN SODIUM-DEXTROSE 2-4 GM/100ML-% IV SOLN
2.0000 g | Freq: Four times a day (QID) | INTRAVENOUS | Status: AC
  Administered 2016-10-09 – 2016-10-10 (×3): 2 g via INTRAVENOUS
  Filled 2016-10-09 (×3): qty 100

## 2016-10-09 MED ORDER — BUPIVACAINE LIPOSOME 1.3 % IJ SUSP
20.0000 mL | Freq: Once | INTRAMUSCULAR | Status: AC
  Administered 2016-10-09: 10 mL
  Filled 2016-10-09: qty 20

## 2016-10-09 MED ORDER — KETOROLAC TROMETHAMINE 15 MG/ML IJ SOLN
15.0000 mg | Freq: Four times a day (QID) | INTRAMUSCULAR | Status: AC
  Administered 2016-10-09 – 2016-10-10 (×4): 15 mg via INTRAVENOUS
  Filled 2016-10-09 (×3): qty 1

## 2016-10-09 MED ORDER — ACETAMINOPHEN 325 MG PO TABS: 650.0000 mg | ORAL_TABLET | Freq: Four times a day (QID) | ORAL | Status: DC | PRN

## 2016-10-09 MED ORDER — DOCUSATE SODIUM 100 MG PO CAPS
100.0000 mg | ORAL_CAPSULE | Freq: Two times a day (BID) | ORAL | Status: DC
  Administered 2016-10-09 (×2): 100 mg via ORAL
  Filled 2016-10-09 (×3): qty 1

## 2016-10-09 MED ORDER — LABETALOL HCL 5 MG/ML IV SOLN
10.0000 mg | INTRAVENOUS | Status: DC | PRN
  Administered 2016-10-09: 10 mg via INTRAVENOUS

## 2016-10-09 MED ORDER — ACETAMINOPHEN 650 MG RE SUPP: 650.0000 mg | Freq: Four times a day (QID) | RECTAL | Status: DC | PRN

## 2016-10-09 MED ORDER — VITAMIN C 500 MG PO TABS
250.0000 mg | ORAL_TABLET | Freq: Every day | ORAL | Status: DC
  Administered 2016-10-09 – 2016-10-10 (×2): 250 mg via ORAL
  Filled 2016-10-09 (×2): qty 1

## 2016-10-09 MED ORDER — ONDANSETRON HCL 4 MG/2ML IJ SOLN: 4.0000 mg | Freq: Four times a day (QID) | INTRAMUSCULAR | Status: DC | PRN

## 2016-10-09 MED ORDER — LISINOPRIL 20 MG PO TABS
20.0000 mg | ORAL_TABLET | Freq: Every day | ORAL | Status: DC
Start: 2016-10-09 — End: 2016-10-10
  Administered 2016-10-09: 20 mg via ORAL
  Filled 2016-10-09 (×2): qty 1

## 2016-10-09 MED ORDER — PHENYLEPHRINE HCL 10 MG/ML IJ SOLN
INTRAMUSCULAR | Status: DC | PRN
  Administered 2016-10-09 (×3): 80 ug via INTRAVENOUS

## 2016-10-09 MED ORDER — METOCLOPRAMIDE HCL 5 MG/ML IJ SOLN: 5.0000 mg | Freq: Three times a day (TID) | INTRAMUSCULAR | Status: DC | PRN

## 2016-10-09 MED ORDER — MIDAZOLAM HCL 5 MG/5ML IJ SOLN
INTRAMUSCULAR | Status: DC | PRN
  Administered 2016-10-09: 2 mg via INTRAVENOUS

## 2016-10-09 MED ORDER — HYDROMORPHONE HCL 1 MG/ML IJ SOLN
0.2500 mg | INTRAMUSCULAR | Status: DC | PRN
  Administered 2016-10-09: 0.5 mg via INTRAVENOUS

## 2016-10-09 MED ORDER — FENTANYL CITRATE (PF) 100 MCG/2ML IJ SOLN
INTRAMUSCULAR | Status: DC | PRN
  Administered 2016-10-09: 150 ug via INTRAVENOUS
  Administered 2016-10-09 (×3): 50 ug via INTRAVENOUS
  Administered 2016-10-09: 100 ug via INTRAVENOUS
  Administered 2016-10-09: 50 ug via INTRAVENOUS

## 2016-10-09 SURGICAL SUPPLY — 63 items
BANDAGE ACE 4X5 VEL STRL LF (GAUZE/BANDAGES/DRESSINGS) IMPLANT
BANDAGE ACE 6X5 VEL STRL LF (GAUZE/BANDAGES/DRESSINGS) IMPLANT
BANDAGE ELASTIC 6 VELCRO ST LF (GAUZE/BANDAGES/DRESSINGS) ×3 IMPLANT
BANDAGE ESMARK 6X9 LF (GAUZE/BANDAGES/DRESSINGS) IMPLANT
BNDG COHESIVE 4X5 TAN STRL (GAUZE/BANDAGES/DRESSINGS) IMPLANT
BNDG ESMARK 6X9 LF (GAUZE/BANDAGES/DRESSINGS)
BNDG GAUZE ELAST 4 BULKY (GAUZE/BANDAGES/DRESSINGS) ×3 IMPLANT
COVER SURGICAL LIGHT HANDLE (MISCELLANEOUS) ×3 IMPLANT
CUFF TOURNIQUET SINGLE 18IN (TOURNIQUET CUFF) IMPLANT
CUFF TOURNIQUET SINGLE 24IN (TOURNIQUET CUFF) IMPLANT
CUFF TOURNIQUET SINGLE 34IN LL (TOURNIQUET CUFF) IMPLANT
CUFF TOURNIQUET SINGLE 44IN (TOURNIQUET CUFF) IMPLANT
DRAIN HEMOVAC 7FR (DRAIN) ×3 IMPLANT
DRAPE C-ARM 42X72 X-RAY (DRAPES) ×3 IMPLANT
DRAPE HALF SHEET 40X57 (DRAPES) IMPLANT
DRAPE INCISE IOBAN 66X45 STRL (DRAPES) ×9 IMPLANT
DRAPE ORTHO SPLIT 77X108 STRL (DRAPES) ×6
DRAPE SURG ORHT 6 SPLT 77X108 (DRAPES) ×3 IMPLANT
DRAPE U-SHAPE 47X51 STRL (DRAPES) ×6 IMPLANT
DRSG EMULSION OIL 3X3 NADH (GAUZE/BANDAGES/DRESSINGS) ×3 IMPLANT
DRSG MEPILEX BORDER 4X4 (GAUZE/BANDAGES/DRESSINGS) ×6 IMPLANT
DRSG MEPILEX BORDER 4X8 (GAUZE/BANDAGES/DRESSINGS) ×3 IMPLANT
DRSG PAD ABDOMINAL 8X10 ST (GAUZE/BANDAGES/DRESSINGS) ×3 IMPLANT
DURAPREP 26ML APPLICATOR (WOUND CARE) ×6 IMPLANT
ELECT REM PT RETURN 9FT ADLT (ELECTROSURGICAL) ×3
ELECTRODE REM PT RTRN 9FT ADLT (ELECTROSURGICAL) ×1 IMPLANT
EVACUATOR SILICONE 100CC (DRAIN) ×3 IMPLANT
GAUZE SPONGE 4X4 12PLY STRL (GAUZE/BANDAGES/DRESSINGS) ×3 IMPLANT
GLOVE BIOGEL PI IND STRL 6.5 (GLOVE) ×1 IMPLANT
GLOVE BIOGEL PI IND STRL 8 (GLOVE) ×1 IMPLANT
GLOVE BIOGEL PI IND STRL 9 (GLOVE) ×1 IMPLANT
GLOVE BIOGEL PI INDICATOR 6.5 (GLOVE) ×2
GLOVE BIOGEL PI INDICATOR 8 (GLOVE) ×2
GLOVE BIOGEL PI INDICATOR 9 (GLOVE) ×2
GLOVE ECLIPSE 9.0 STRL (GLOVE) IMPLANT
GLOVE ORTHO TXT STRL SZ7.5 (GLOVE) ×3 IMPLANT
GLOVE SS PI 9.0 STRL (GLOVE) ×3 IMPLANT
GLOVE SURG 8.5 LATEX PF (GLOVE) IMPLANT
GLOVE SURG SS PI 6.5 STRL IVOR (GLOVE) ×3 IMPLANT
GLOVE SURG SS PI 8.0 STRL IVOR (GLOVE) ×6 IMPLANT
GOWN STRL REUS W/ TWL LRG LVL3 (GOWN DISPOSABLE) ×1 IMPLANT
GOWN STRL REUS W/TWL 2XL LVL3 (GOWN DISPOSABLE) ×6 IMPLANT
GOWN STRL REUS W/TWL LRG LVL3 (GOWN DISPOSABLE) ×2
KIT BASIN OR (CUSTOM PROCEDURE TRAY) ×3 IMPLANT
KIT ROOM TURNOVER OR (KITS) ×3 IMPLANT
MANIFOLD NEPTUNE II (INSTRUMENTS) ×3 IMPLANT
NS IRRIG 1000ML POUR BTL (IV SOLUTION) ×3 IMPLANT
PACK GENERAL/GYN (CUSTOM PROCEDURE TRAY) ×3 IMPLANT
PAD ARMBOARD 7.5X6 YLW CONV (MISCELLANEOUS) ×6 IMPLANT
PAD CAST 4YDX4 CTTN HI CHSV (CAST SUPPLIES) ×1 IMPLANT
PADDING CAST COTTON 4X4 STRL (CAST SUPPLIES) ×2
STAPLER SKIN PROX WIDE 3.9 (STAPLE) ×3 IMPLANT
STAPLER VISISTAT 35W (STAPLE) ×3 IMPLANT
STOCKINETTE IMPERVIOUS 9X36 MD (GAUZE/BANDAGES/DRESSINGS) IMPLANT
SUT ETHILON 4 0 FS 1 (SUTURE) ×3 IMPLANT
SUT VIC AB 0 CT1 27 (SUTURE) ×2
SUT VIC AB 0 CT1 27XBRD ANBCTR (SUTURE) ×1 IMPLANT
SUT VIC AB 1 CTX 27 (SUTURE) ×3 IMPLANT
SUT VIC AB 2-0 CT1 27 (SUTURE) ×4
SUT VIC AB 2-0 CT1 TAPERPNT 27 (SUTURE) ×2 IMPLANT
TOWEL OR 17X24 6PK STRL BLUE (TOWEL DISPOSABLE) ×3 IMPLANT
TOWEL OR 17X26 10 PK STRL BLUE (TOWEL DISPOSABLE) ×3 IMPLANT
WATER STERILE IRR 1000ML POUR (IV SOLUTION) ×3 IMPLANT

## 2016-10-09 NOTE — H&P (Signed)
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 Symptoms are rated as moderate to severe, and have been worsening.  This is significantly impairing activities of daily living.  He has elected for surgical management.   Past Medical History:  Diagnosis Date  . Arthritis    knee, back  . Chronic back pain   . Dyspnea    with exertion  . Hypertension   . Obesity   . PTSD (post-traumatic stress disorder)    Past Surgical History:  Procedure Laterality Date  . CERVICAL FUSION  2007   C 5, 6 ,7  . CLOSED REDUCTION WRIST FRACTURE Left 04/01/2015   Procedure: CLOSED REDUCTION WRIST AND CASTING REPAIR LACERATION TO FINGERS TIMES 2;  Surgeon: Dorna Leitz, MD;  Location: [facility];  Service: Orthopedics;  Laterality: Left;  . FEMUR IM NAIL Right 04/01/2015   Procedure: INTRAMEDULLARY (IM) RETROGRADE FEMORAL NAILING ;  Surgeon: Dorna Leitz, MD;  Location: [facility];  Service: Orthopedics;  Laterality: Right;  . RESECTION DISTAL CLAVICAL Right 2009  . SHOULDER SURGERY Right    Social History   Social History  . Marital status: Single    Spouse name: N/A  . Number of children: N/A  . Years of education: N/A   Social History Main Topics  . Smoking status: Never Smoker  . Smokeless tobacco: Never Used  . Alcohol use Yes     Comment: occasional   . Drug use: No  . Sexual activity: Not Asked   Other Topics Concern  . None   Social History Narrative   ** Merged History Encounter **       Family History  Problem Relation Age of Onset  . Colon cancer Neg Hx    Allergies  Allergen Reactions  . Latex Rash and Swelling   Prior to Admission medications   Medication Sig Start Date End Date Taking? Authorizing  Provider  Ascorbic Acid (VITAMIN C PO) Take 1 tablet by mouth daily.   Yes [provider]  cyclobenzaprine (FLEXERIL) 10 MG tablet Take 10 mg by mouth 3 (three) times daily as needed for muscle spasms.   Yes [provider]  Oxycodone HCl 10 MG TABS Take 10 mg by mouth every 8 (eight) hours as needed. for pain 07/25/16  Yes [provider]  oxymorphone (OPANA ER) 10 MG 12 hr tablet Take 10 mg by mouth 3 (three) times daily. 07/25/16  Yes [provider]  lisinopril (PRINIVIL,ZESTRIL) 20 MG tablet Take 1 tablet (20 mg total) by mouth daily. Patient not taking: Reported on 10/01/2016 08/29/16   Shelda Pal, DO     Positive ROS: All other systems have been reviewed and were otherwise negative with the exception of those mentioned in the HPI and as above.  Physical Exam: General: Alert, no acute distress Cardiovascular: No pedal edema Respiratory: No cyanosis, no use of accessory musculature GI: No organomegaly, abdomen is soft and non-tender Skin: No lesions in the area of chief complaint Neurologic: Sensation intact distally Psychiatric: Patient is competent for consent with normal mood and affect Lymphatic: No axillary or cervical lymphadenopathy  MUSCULOSKELETAL: Right proximal lateral thigh subcutaneuous ballatable mass  6-8 cm in the mid to proximal 1/3 of the femur. 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   [name], MD Cell [phone] Office [phone] 10/09/2016 7:28 AM

## 2016-10-09 NOTE — Discharge Instructions (Signed)
° ° °  Keep dressing dry. May use tub chair to shower  Call if there is odor or saturation of dressing or worsening pain not controlled with medications. Call if fever greater than 101.5. Use crutches or walker 50% weight bearing on the right leg. Please follow up with an appointment with Dr. Yomayra Tate  2 weeks from the time of surgery [phone]. Elevate as often as possible during the first week after surgery gradually increasing the time the leg is dependent or down there after. If swelling recurrs then elevate again. Wheel chair for longer distances.Apply ice to the surgery site as needed to relieve pain. Take aspirin 325 mg every day with food or snack  

## 2016-10-09 NOTE — Anesthesia Preprocedure Evaluation (Signed)
Anesthesia Evaluation  Patient identified by MRN, date of birth, ID band Patient awake    Reviewed: Allergy & Precautions, NPO status , Patient's Chart, lab work & pertinent test results  Airway Mallampati: II       Dental   Pulmonary shortness of breath,    breath sounds clear to auscultation       Cardiovascular hypertension,  Rhythm:Regular Rate:Normal     Neuro/Psych    GI/Hepatic   Endo/Other    Renal/GU      Musculoskeletal  (+) Arthritis ,   Abdominal (+) + obese,   Peds  Hematology   Anesthesia Other Findings   Reproductive/Obstetrics                             Anesthesia Physical Anesthesia Plan  ASA: II  Anesthesia Plan: General   Post-op Pain Management:    Induction: Intravenous  PONV Risk Score and Plan: 3 and Ondansetron, Dexamethasone, Midazolam, Propofol infusion and Treatment may vary due to age or medical condition  Airway Management Planned: Oral ETT  Additional Equipment:   Intra-op Plan:   Post-operative Plan:   Informed Consent: I have reviewed the patients History and Physical, chart, labs and discussed the procedure including the risks, benefits and alternatives for the proposed anesthesia with the patient or authorized representative who has indicated his/her understanding and acceptance.   Dental advisory given  Plan Discussed with: CRNA  Anesthesia Plan Comments:         Anesthesia Quick Evaluation

## 2016-10-09 NOTE — Anesthesia Procedure Notes (Signed)
Procedure Name: Intubation Date/Time: 10/09/2016 7:51 AM Performed by: Ollen Bowl Pre-anesthesia Checklist: Patient identified, Emergency Drugs available, Suction available, Patient being monitored and Timeout performed Patient Re-evaluated:Patient Re-evaluated prior to induction Oxygen Delivery Method: Circle system utilized Preoxygenation: Pre-oxygenation with 100% oxygen Induction Type: IV induction Ventilation: Mask ventilation without difficulty Laryngoscope Size: Miller and 3 Grade View: Grade II Tube type: Oral Tube size: 7.5 mm Number of attempts: 1 Airway Equipment and Method: Patient positioned with wedge pillow and Stylet Placement Confirmation: ETT inserted through vocal cords under direct vision,  positive ETCO2 and breath sounds checked- equal and bilateral Secured at: 22 cm Tube secured with: Tape Dental Injury: Teeth and Oropharynx as per pre-operative assessment

## 2016-10-09 NOTE — Brief Op Note (Signed)
10/09/2016  11:05 AM  PATIENT:  Dustin [name]  51 y.o. 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 Orene Desanctis.  EBL:  Total I/O In: 1000 [I.V.:1000] Out: 100 [Blood:100]  BLOOD ADMINISTERED:none  DRAINS: (medium ) Jackson-Pratt drain(s) with closed bulb suction in the left anterolateral thigh.   LOCAL MEDICATIONS USED:  MARCAINE 0.5% 1:1 EXPAREL 1.3% Amount: 40 ml  SPECIMEN:  Source of Specimen:  Left anterolateral thigh compartment, vastus lateralis, seroma and seroma wall.  DISPOSITION OF SPECIMEN:  PATHOLOGY  COUNTS:  YES  TOURNIQUET:  * No tourniquets in log *  DICTATION: .Dragon Dictation  PLAN OF CARE: Admit for overnight observation  PATIENT DISPOSITION:  PACU - hemodynamically stable.   Delay start of Pharmacological VTE agent (>24hrs) due to surgical blood loss or risk of bleeding: no

## 2016-10-09 NOTE — Anesthesia Postprocedure Evaluation (Signed)
Anesthesia Post Note  Patient: Dustin [name]  Procedure(s) Performed: Procedure(s) (LRB): Dynamization of Nail Right  femur, removal of Screws and Excision of hematoma cavity right thigh (Right) EVACUATION HEMATOMA (Right)     Patient location during evaluation: PACU Anesthesia Type: General Level of consciousness: awake and alert Pain management: pain level controlled Vital Signs Assessment: post-procedure vital signs reviewed and stable Respiratory status: spontaneous breathing, nonlabored ventilation, respiratory function stable and patient connected to nasal cannula oxygen Cardiovascular status: blood pressure returned to baseline and stable Postop Assessment: no signs of nausea or vomiting Anesthetic complications: no    Last Vitals:  Vitals:   10/09/16 1235 10/09/16 1302  BP: (!) 160/98 (!) 141/90  Pulse: 100   Resp: 17   Temp:    SpO2: 99%     Last Pain:  Vitals:   10/09/16 1235  PainSc: 8                  Jakaiden Fill,JAMES TERRILL

## 2016-10-09 NOTE — Op Note (Signed)
10/09/2016  11:57 AM  PATIENT:  Dustin [name]  51 y.o. 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 Orene Desanctis.    COMPLICATIONS:  None.     COMPONENTS:3  5.0 mm interlocking screws removed, the most distal interlocking IM screw and the two proximal Interlocking IM screws. The most proximal was buried under bone.    PROCEDURE: The patient was met in the holding area, and the appropriate right thigh identified and marked with an "X" and my initials.    The patient was then transported to OR and was placed on the operative table in a supine position. The patient was then placed under  general anesthesia without difficulty. The patient received appropriate preoperative antibiotic prophylaxis.   The right leg was then prepped using sterile conditions and draped using sterile technique from the right ankle to the right iliac crest. Time-out procedure was called and correct  In impervious stockinette used to drape the right leg then iodine Vi-Drape used to drape the right thigh to the right anterior iliac crest down to the right knee and right proximal leg. A marking pen used to mark the old incision scar used for placement of lateral distal interlocking screws for the retrograde nail. Also used to mark the old incision scar over the anterior  proximal thigh. A new incision was then drawn up in line with the anterior superior iliac crest and the lateral aspect of the patella this was drawn up using a C-arm fluoroscopy to ascertain the superior margin of the intramedullary nail as the seroma/hematoma of the right side was felt to be within the interval between the rectus femoris muscle and the vastus lateralis. Length of this incision that judged to be about 15 cm The knee then placed onto a triangle and C-arm fluoroscopy used toidentify the distal interlocking screws 10 blade scalpel used to make a stab incision and the old previous incision scar distal laterally aspect of the right femoral condyle. Incision was carried into the iliotibial band and then this was spread using tonsil clamp down to the head of the distal interlocking screw. 5 mm hex screwdriver then engaged the head of the screw and the screw was then backed out. Irrigation was performed of this incision site. Attention then directed to the proximal thigh and the leg brought into full extension on the [facility] flat table. C-arm fluoroscopy used to identify the proximal femur and the proximal portion of the intramedullary nail. The nail and the femur appeared to be quite lateral to the area of previous insertion site along the anterior proximal so that the only incision used for removal of the proximal interlocking screws was that also used for resection of the hematoma seroma of the right side. Incision about 15 cm in length in line with the anterior superior iliac spine and the lateral aspect of the patella skin and subcutaneous layers  using cautery to control small bleeders. The superficial fascia incised in line with the skin incision and the patient's anterior rectus and vastus lateralis was identified and the palpable blotting mass over the proximal one third of the femur laterally was identified. Using a C-arm fluoroscopy the interval between the rectus femoris muscle and the vastus  lateralis was then spread using a tonsil clamp down to the anterior aspect of the femur overlying the proximal interlocking screws. Scar tissue here was carefully spread and Metzenbaum scissors was used to develop the interval here further using first self-retaining retractors and then using a boss McCollough retractor the anterior aspect of the proximal femur was able to be visualized and Cobb elevators used to move bluntly the soft tissues overlying the anterior aspect of the proximal femur C-arm fluoroscopy brought into the field the distal of the 2 proximal interlocking screws was easily identified and a screwdriver and disengaging the screw head was able to removed using tonsil and a right angle clamp to provide some traction on the screw with its removal. The more proximal of the 2 proximal interlocking screws was buried under bone and this was identified using C-arm fluoroscopy a quarter inch osteotome then used to resect bone exposing the head of the screw this took upwards of 45 minutes to an hour for removal of the single screw moving a small amount of bone of the time until the screw head was able to be identified and then engaged with screwdriver and removed.irrigation was then carried out carefully to the proximal femur with copious amounts of irrigant solutions. The rectus muscle then retracted medially and the cyst lateralis and the seroma-hematoma was able to be identified using Metzenbaum scissors and pickups then the anterior two thirds of this hematoma sac was able to be dissected free of the surrounding tissues. A 10 blade scalpel was then used to incise directly into the hematoma and open the anterior aspect of the mass. The seroma cavity contained friable hematoma that was in various stages of organization there was no bleeding evident and this was then carefully stripped out using finger dissection to remove this en masse. The anterior wall of the cavity that was the wall of the seroma  hematoma was then resected until was small enough that it could be imbricated. Irrigation was carried out using copious amounts of irrigant solution and then the remaining residual deep seroma wall was reapproximated with interrupted #1 Vicryl sutures more superficial layers also approximated with interrupted #1 Vicryl sutures. Superficial fascia of the rectus and the vastus was then approximated with interrupted 0 Vicryl sutures. Jackson-Pratt drain placed beneath the submuscular layer between it and the deeper seroma that had been imbricated. Superficial at layer was then approximated with interrupted #1 Vicryl sutures deep subcutaneous layers with interrupted 0 and 2-0 Vicryl sutures and skin closed with stainless steel staples. A stab incision over the distal lateral aspect of the femur was then approximated with an interrupted 0 Vicryl suture and then stainless steel staples. Mepilex bandage then applied the Jackson-Pratt charged and Mepilex bandage applied over the Jackson-Pratt as well as over the distal lateral aspect of the right femur and 6 inch Ace wrap then used to wrap the leg from the right ankle to the right upper thigh. Patient was then reactivated extubated and returned to the recovery room in satisfactory condition note that the the subcutaneous layers deeper layers were infiltrated with Marcaine and extraoral combination of 1-1 40 mL. All instrument and sponge counts  were correct. Assessment lumbar loss was 150-200 mL's.         [name]  10/09/2016, 11:57 AM

## 2016-10-09 NOTE — Evaluation (Signed)
Physical Therapy Evaluation Patient Details Name: Dustin [name] MRN: [id number] DOB: [birth year] Today's Date: 10/09/2016   History of Present Illness  Pt is a 51 y.o. male now s/p dynamization of R femur IM nail, removal of screws and hematoma evacuation on 10/09/16. Pertinent PMH includes HTN, chronic back pain, arthritis, PTSD.  Clinical Impression  Pt presents to PT with pain, decreased strength/ROM, and an overall decrease in functional mobility secondary to above. PTA, pt indep and lives at home with significant other who is available to provide 24/7 assist if needed. Pt denied OOB mobility today secondary to pain. Completed LE therex and able to reposition in bed mod indep; educ on importance of mobility. Pt would benefit from continued acute PT services to maximize functional mobility and indep prior to d/c home pending progression.      Follow Up Recommendations DC plan and follow up therapy as arranged by surgeon;Home health PT    Equipment Recommendations  Other (comment) (Pending pt progression)    Recommendations for Other Services       Precautions / Restrictions Restrictions Weight Bearing Restrictions: Yes      Mobility  Bed Mobility Overal bed mobility: Modified Independent             General bed mobility comments: Able to scoot up in bed with use of bed rails and cues for technique; no physical assist required  Transfers                 General transfer comment: Refused OOB mobility secondary to pain  Ambulation/Gait                Stairs            Wheelchair Mobility    Modified Rankin (Stroke Patients Only)       Balance                                             Pertinent Vitals/Pain Pain Assessment: Faces Faces Pain Scale: Hurts whole lot Pain Location: RLE Pain Intervention(s): Monitored during session;Limited activity within patient's tolerance    Home Living Family/patient expects to be  discharged to:: Private residence Living Arrangements: Spouse/significant other Available Help at Discharge: Family;Available 24 hours/day Type of Home: Apartment Home Access: Level entry     Home Layout: Multi-level;Able to live on main level with bedroom/bathroom Home Equipment: None      Prior Function Level of Independence: Independent               Hand Dominance        Extremity/Trunk Assessment   Upper Extremity Assessment Upper Extremity Assessment: Overall WFL for tasks assessed    Lower Extremity Assessment Lower Extremity Assessment: RLE deficits/detail RLE: Unable to fully assess due to pain       Communication   Communication: No difficulties  Cognition Arousal/Alertness: Awake/alert Behavior During Therapy: WFL for tasks assessed/performed Overall Cognitive Status: Within Functional Limits for tasks assessed                                        General Comments      Exercises Total Joint Exercises Ankle Circles/Pumps: AROM;Both;Supine Quad Sets: Right;5 reps;Supine;Strengthening Hip ABduction/ADduction: AAROM;Right;5 reps;Supine   Assessment/Plan    PT Assessment Patient needs  continued PT services  PT Problem List Decreased strength;Decreased mobility;Decreased knowledge of precautions;Decreased range of motion;Decreased activity tolerance;Decreased balance;Decreased knowledge of use of DME;Pain       PT Treatment Interventions DME instruction;Gait training;Stair training;Functional mobility training;Therapeutic activities;Therapeutic exercise;Balance training;Patient/family education    PT Goals (Current goals can be found in the Care Plan section)  Acute Rehab PT Goals Patient Stated Goal: Return home PT Goal Formulation: With patient Time For Goal Achievement: 10/23/16 Potential to Achieve Goals: Good    Frequency Min 5X/week   Barriers to discharge        Co-evaluation               AM-PAC PT "6  Clicks" Daily Activity  Outcome Measure Difficulty turning over in bed (including adjusting bedclothes, sheets and blankets)?: None Difficulty moving from lying on back to sitting on the side of the bed? : Unable Difficulty sitting down on and standing up from a chair with arms (e.g., wheelchair, bedside commode, etc,.)?: Unable Help needed moving to and from a bed to chair (including a wheelchair)?: Total Help needed walking in hospital room?: Total Help needed climbing 3-5 steps with a railing? : Total 6 Click Score: 9    End of Session   Activity Tolerance: Patient limited by pain Patient left: in bed;with call bell/phone within reach;with family/visitor present Nurse Communication: Mobility status PT Visit Diagnosis: Other abnormalities of gait and mobility (R26.89);Pain Pain - Right/Left: Right Pain - part of body: Leg    Time: 3329-5188 PT Time Calculation (min) (ACUTE ONLY): 16 min   Charges:   PT Evaluation $PT Eval Moderate Complexity: 1 Mod     PT G Codes:   PT G-Codes **NOT FOR INPATIENT CLASS** Functional Assessment Tool Used: AM-PAC 6 Clicks Basic Mobility Functional Limitation: Mobility: Walking and moving around Mobility: Walking and Moving Around Current Status (C1660): At least 60 percent but less than 80 percent impaired, limited or restricted Mobility: Walking and Moving Around Goal Status [id number]): At least 1 percent but less than 20 percent impaired, limited or restricted   Mabeline Caras, PT, DPT [phone] Acute Rehab  Derry Lory 10/09/2016, 4:01 PM

## 2016-10-09 NOTE — Transfer of Care (Signed)
Immediate Anesthesia Transfer of Care Note  Patient: Dustin [name]  Procedure(s) Performed: Procedure(s): Dynamization of Nail Right  femur, removal of Screws and Excision of hematoma cavity right thigh (Right) EVACUATION HEMATOMA (Right)  Patient Location: PACU  Anesthesia Type:General  Level of Consciousness: awake, alert  and oriented  Airway & Oxygen Therapy: Patient Spontanous Breathing and Patient connected to nasal cannula oxygen  Post-op Assessment: Report given to RN and Post -op Vital signs reviewed and stable  Post vital signs: Reviewed and stable  Last Vitals:  Vitals:   10/09/16 0551  BP: (!) 172/113  Pulse: (!) 107  Resp: 18  Temp: 36.8 C  SpO2: 100%    Last Pain:  Vitals:   10/09/16 0611  PainSc: 8          Complications: No apparent anesthesia complications

## 2016-10-10 ENCOUNTER — Encounter (HOSPITAL_COMMUNITY): Payer: Self-pay | Admitting: Specialist

## 2016-10-10 ENCOUNTER — Telehealth (INDEPENDENT_AMBULATORY_CARE_PROVIDER_SITE_OTHER): Payer: Self-pay

## 2016-10-10 DIAGNOSIS — T84194A Other mechanical complication of internal fixation device of right femur, initial encounter: Secondary | ICD-10-CM | POA: Diagnosis not present

## 2016-10-10 MED ORDER — ASPIRIN EC 325 MG PO TBEC: 325.0000 mg | DELAYED_RELEASE_TABLET | Freq: Two times a day (BID) | ORAL | 0 refills | Status: DC

## 2016-10-10 MED ORDER — OXYMORPHONE HCL ER 10 MG PO TB12: 10.0000 mg | ORAL_TABLET | Freq: Three times a day (TID) | ORAL | 0 refills | Status: DC

## 2016-10-10 MED ORDER — OXYCODONE HCL 10 MG PO TABS: 10.0000 mg | ORAL_TABLET | ORAL | 0 refills | Status: DC | PRN

## 2016-10-10 MED ORDER — DOCUSATE SODIUM 100 MG PO CAPS: 100.0000 mg | ORAL_CAPSULE | Freq: Two times a day (BID) | ORAL | 0 refills | Status: DC

## 2016-10-10 MED ORDER — CYCLOBENZAPRINE HCL 10 MG PO TABS: 10.0000 mg | ORAL_TABLET | Freq: Three times a day (TID) | ORAL | 0 refills | Status: AC | PRN
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
## 2016-10-10 NOTE — Telephone Encounter (Signed)
Patient states that pharmacy needs PA for Rx for oxymorphone.  CB# is [phone].  Please advise.  Thank You.

## 2016-10-10 NOTE — Progress Notes (Signed)
Physical Therapy Treatment Patient Details Name: Dustin [name] MRN: [id number] DOB: 1965/11/20 Today's Date: 10/10/2016    History of Present Illness Pt is a 51 y.o. male now s/p dynamization of R femur IM nail, removal of screws and hematoma evacuation on 10/09/16. Pertinent PMH includes HTN, chronic back pain, arthritis, PTSD.    PT Comments    Pt progressed well towards goals this session. Increased ambulation tolerance and required min guard to supervision for safety with use of RW. 3 in 1 delivered during session, and pt reports he has RW at home. Current recommendations appropriate. Will continue to follow acutely to maximize functional mobility independence and safety.   Follow Up Recommendations  DC plan and follow up therapy as arranged by surgeon;Home health PT     Equipment Recommendations  None recommended by PT (HAs RW at home )    Recommendations for Other Services       Precautions / Restrictions Precautions Precautions: None Restrictions Weight Bearing Restrictions: Yes RLE Weight Bearing: Partial weight bearing RLE Partial Weight Bearing Percentage or Pounds: 50    Mobility  Bed Mobility Overal bed mobility: Modified Independent             General bed mobility comments: Used LLE to hook RLE for moving to EOB. Increased time, however, no assist required.   Transfers Overall transfer level: Needs assistance Equipment used: Rolling walker (2 wheeled) Transfers: Sit to/from Stand Sit to Stand: Min guard         General transfer comment: [employer] for safety. Verbal cues for safe hand placement.   Ambulation/Gait Ambulation/Gait assistance: Min guard;Supervision Ambulation Distance (Feet): 100 Feet Assistive device: Rolling walker (2 wheeled) Gait Pattern/deviations: Step-to pattern;Decreased step length - left;Decreased step length - right;Decreased weight shift to right;Antalgic Gait velocity: Decreased Gait velocity interpretation: Below normal  speed for age/gender General Gait Details: Slow, antalgic gait secondary to post op pain and weakness. Verbal cues initially to maintain PWB, however, pt demonstrated good technique following. Verbal cues for appropriate use of RW as pt wanted to pick it up every time he took a step.    Stairs            Wheelchair Mobility    Modified Rankin (Stroke Patients Only)       Balance Overall balance assessment: Needs assistance Sitting-balance support: No upper extremity supported;Feet supported Sitting balance-[name]: Good     Standing balance support: Bilateral upper extremity supported;During functional activity Standing balance-[name]: Poor Standing balance comment: Reliant on RW for stability                            Cognition Arousal/Alertness: Awake/alert Behavior During Therapy: WFL for tasks assessed/performed Overall Cognitive Status: Within Functional Limits for tasks assessed                                        Exercises      General Comments General comments (skin integrity, edema, etc.): Reviewed ther ex program with pt and pt reports he feels comfortable with it, however, does not want to perform at this time.       Pertinent Vitals/Pain Pain Assessment: 0-10 Pain Score: 9  Pain Location: RLE Pain Descriptors / Indicators: Operative site guarding;Aching Pain Intervention(s): Limited activity within patient's tolerance;Monitored during session;Repositioned    Home Living  Prior Function            PT Goals (current goals can now be found in the care plan section) Acute Rehab PT Goals Patient Stated Goal: Return home PT Goal Formulation: With patient Time For Goal Achievement: 10/23/16 Potential to Achieve Goals: Good Progress towards PT goals: Progressing toward goals    Frequency    Min 5X/week      PT Plan Current plan remains appropriate    Co-evaluation               AM-PAC PT "6 Clicks" Daily Activity  Outcome Measure  Difficulty turning over in bed (including adjusting bedclothes, sheets and blankets)?: None Difficulty moving from lying on back to sitting on the side of the bed? : None Difficulty sitting down on and standing up from a chair with arms (e.g., wheelchair, bedside commode, etc,.)?: Unable Help needed moving to and from a bed to chair (including a wheelchair)?: A Little Help needed walking in hospital room?: A Little Help needed climbing 3-5 steps with a railing? : A Little 6 Click Score: 18    End of Session Equipment Utilized During Treatment: Gait belt Activity Tolerance: Patient tolerated treatment well Patient left: in bed;with call bell/phone within reach;with family/visitor present Nurse Communication: Mobility status PT Visit Diagnosis: Other abnormalities of gait and mobility (R26.89);Pain Pain - Right/Left: Right Pain - part of body: Leg     Time: 1045-1105 PT Time Calculation (min) (ACUTE ONLY): 20 min  Charges:  $Gait Training: 8-22 mins                    G Codes:  Functional Assessment Tool Used: AM-PAC 6 Clicks Basic Mobility Functional Limitation: Mobility: Walking and moving around Mobility: Walking and Moving Around Current Status (S0109): At least 40 percent but less than 60 percent impaired, limited or restricted Mobility: Walking and Moving Around Goal Status [id number]): At least 1 percent but less than 20 percent impaired, limited or restricted    Leighton Ruff, PT, DPT  Acute Rehabilitation Services  Pager: [place] 10/10/2016, 11:41 AM

## 2016-10-10 NOTE — Care Management Obs Status (Signed)
[place] NOTIFICATION   Patient Details  Name: Dustin [name] MRN: [id number] Date of Birth: 03-Jul-1965   Medicare Observation Status Notification Given:  Yes   Did not sign. Marilu Favre, RN 10/10/2016, 10:14 AM

## 2016-10-10 NOTE — Care Management Note (Addendum)
Case Management Note  Patient Details  Name: Dustin [name] MRN: [id number] Date of Birth: 02/08/1966  Subjective/Objective:                    Action/Plan:  Called DR [name]'s office , patient was set up with [facility] in past but refused visit. Called [place] with Nanine Means . Ronalee Belts will attempt to arrange visits again. Discussed home health with patient and visitor at bedside.   Patient states he already has home health but unsure of agency name. Explained home health agency needed updated information . Asked if home health was arranged through a MD office. Cannot found record in [facility] Patient stated "I do not need anything from you. I have home health"  Patient requesting 3 n 1 , same ordered through [facility]. Patient requesting rollator , however he received one in 2017 through his insurance. Explained NCM can order one but insurance would not cover it due to him just getting one last year but he could buy it. Patient stated he does not want one now. Ordered rolling walker through [facility].  Expected Discharge Date:  10/10/16               Expected Discharge Plan:  [place]  In-House Referral:     Discharge planning Services  CM Consult  Post Acute Care Choice:  Durable Medical Equipment, Home Health Choice offered to:  Patient  DME Arranged:  3-N-1, Walker rolling DME Agency:  [place]:  PT, OT [facility] Agency:     Status of Service:  In process, will continue to follow  If discussed at Long Length of Stay Meetings, dates discussed:    Additional Comments:  Marilu Favre, RN 10/10/2016, 10:15 AM

## 2016-10-11 NOTE — Telephone Encounter (Signed)
Pending review online, confirmation number [id number].

## 2016-10-11 NOTE — Telephone Encounter (Signed)
Patient called again about this, I will go online to do PA

## 2016-10-12 ENCOUNTER — Telehealth (INDEPENDENT_AMBULATORY_CARE_PROVIDER_SITE_OTHER): Payer: Self-pay | Admitting: Specialist

## 2016-10-12 NOTE — Telephone Encounter (Signed)
Patient called asking for a work note from Dr. Louanne Skye to be faxed to his work place just stating that he had surgery and cannot do any activities/travel until he's fully healed. Fax # [phone]

## 2016-10-12 NOTE — Telephone Encounter (Signed)
Please advise on note?  

## 2016-10-12 NOTE — Telephone Encounter (Signed)
IC patient and advised Opana Rx was denied.

## 2016-10-15 ENCOUNTER — Telehealth (INDEPENDENT_AMBULATORY_CARE_PROVIDER_SITE_OTHER): Payer: Self-pay | Admitting: Specialist

## 2016-10-15 NOTE — Telephone Encounter (Signed)
Patient called, wants to cancel request for work note

## 2016-10-15 NOTE — Telephone Encounter (Signed)
Patient called, wants to cancel request for work note.

## 2016-10-17 ENCOUNTER — Encounter (INDEPENDENT_AMBULATORY_CARE_PROVIDER_SITE_OTHER): Payer: Self-pay | Admitting: Specialist

## 2016-10-17 ENCOUNTER — Ambulatory Visit (INDEPENDENT_AMBULATORY_CARE_PROVIDER_SITE_OTHER): Payer: Medicare HMO | Admitting: Specialist

## 2016-10-17 ENCOUNTER — Ambulatory Visit (INDEPENDENT_AMBULATORY_CARE_PROVIDER_SITE_OTHER): Payer: Medicare HMO

## 2016-10-17 VITALS — BP 161/102 | HR 94 | Ht 73.5 in | Wt 252.0 lb

## 2016-10-17 DIAGNOSIS — Z969 Presence of functional implant, unspecified: Secondary | ICD-10-CM

## 2016-10-17 DIAGNOSIS — M24561 Contracture, right knee: Secondary | ICD-10-CM

## 2016-10-17 NOTE — Progress Notes (Signed)
Post-Op Visit Note   Patient: Dustin [name]           Date of Birth: [birth year]           MRN: [id number] Visit Date: 10/17/2016 PCP: Shelda Pal, DO   Assessment & Plan: One week following dynamization of the right femur IM nail with removal of the proximal and most distal interlocking screws times 3. There is no impingement on the left knee joint. He also had resection of the right thigh seroma which was found to have organized hematoma.  Chief Complaint:  Chief Complaint  Patient presents with  . Right Leg - Routine Post Op   Visit Diagnoses:  1. Retained orthopedic hardware     Plan: Therapy to work with a dynamic splint to regain ROM of the right knee. Knee is suffering from osteoarthritis, only real proven treatments are Weight loss, NSIADs like motrin and alleve and tylenol and exercise. Well padded shoes help. Ice the knee 2-3 times a day 15-20 mins at a time.   Follow-Up Instructions: Return in about 1 week (around 10/24/2016).   Orders:  Orders Placed This Encounter  Procedures  . XR FEMUR, MIN 2 VIEWS RIGHT   No orders of the defined types were placed in this encounter.   Imaging: Xr Femur, Min 2 Views Right  Result Date: 10/17/2016 AP and Lateral of the right femur with staples lateral distal thigh and right proximal anterior thigh. The IM nail is in place and the proximal interlocks and single distal interlocking screw is in good position and alignment. The most distal interlock has been removed and the fracture dynamizes. Still with swelling in the right knee joint. The callus at the fracture site and the right proximal femur without bone lucency other than the screw site.    PMFS History: Patient Active Problem List   Diagnosis Date Noted  . Traumatic seroma of thigh, right, sequela 09/11/2016    Priority: High    Class: Chronic  . Retained orthopedic hardware 09/11/2016    Priority: High    Class: Chronic  . Thigh hematoma, right,  sequela 01/23/2016  . Essential hypertension 12/15/2015  . Distal radius fracture, left 04/02/2015  . Closed fracture of right femur ([place]) 04/01/2015   Past Medical History:  Diagnosis Date  . Arthritis    knee, back  . Chronic back pain   . Dyspnea    with exertion  . Hypertension   . Obesity   . PTSD (post-traumatic stress disorder)     Family History  Problem Relation Age of Onset  . Colon cancer Neg Hx     Past Surgical History:  Procedure Laterality Date  . CERVICAL FUSION  2007   C 5, 6 ,7  . CLOSED REDUCTION WRIST FRACTURE Left 04/01/2015   Procedure: CLOSED REDUCTION WRIST AND CASTING REPAIR LACERATION TO FINGERS TIMES 2;  Surgeon: Dorna Leitz, MD;  Location: [place];  Service: Orthopedics;  Laterality: Left;  . FEMUR IM NAIL Right 04/01/2015   Procedure: INTRAMEDULLARY (IM) RETROGRADE FEMORAL NAILING ;  Surgeon: Dorna Leitz, MD;  Location: [place];  Service: Orthopedics;  Laterality: Right;  . HARDWARE REMOVAL Right 10/09/2016   Procedure: Dynamization of Nail Right  femur, removal of Screws and Excision of hematoma cavity right thigh;  Surgeon: Jessy Oto, MD;  Location: [place];  Service: Orthopedics;  Laterality: Right;  . HEMATOMA EVACUATION Right 10/09/2016   Procedure: EVACUATION HEMATOMA;  Surgeon: Jessy Oto, MD;  Location:  [facility] [facility];  Service: Orthopedics;  Laterality: Right;  . RESECTION DISTAL CLAVICAL Right 2009  . SHOULDER SURGERY Right    Social History   Occupational History  . Not on file.   Social History Main Topics  . Smoking status: Never Smoker  . Smokeless tobacco: Never Used  . Alcohol use Yes     Comment: occasional   . Drug use: No  . Sexual activity: Not on file

## 2016-10-17 NOTE — Telephone Encounter (Signed)
This patient is having surgery to remove a hematoma cavity in the right leg and remove painful hardware. If he has no medications for discomfort I think I would be ignoring the pain from my surgical procedure. He apparently lost the controlled substances that were prescribed. I have only prescribe a 2 week amount of narcotics that will cover him for  Expected pain from surgery and then decide at his follow up whether renewals are necessary. jen

## 2016-10-17 NOTE — Patient Instructions (Signed)
Plan: Therapy to work with a dynamic splint to regain ROM of the right knee. Knee is suffering from osteoarthritis, only real proven treatments are Weight loss, NSIADs like motrin and alleve and tylenol and exercise. Well padded shoes help. Ice the knee 2-3 times a day 15-20 mins at a time.

## 2016-10-23 ENCOUNTER — Telehealth (INDEPENDENT_AMBULATORY_CARE_PROVIDER_SITE_OTHER): Payer: Self-pay

## 2016-10-23 NOTE — Telephone Encounter (Signed)
Patient left voice mail Friday about getting CPM machine.

## 2016-10-23 NOTE — Telephone Encounter (Signed)
Patient left voice mail Friday about getting CPM machine.  Does the patient need this?

## 2016-10-24 ENCOUNTER — Encounter (INDEPENDENT_AMBULATORY_CARE_PROVIDER_SITE_OTHER): Payer: Self-pay | Admitting: Specialist

## 2016-10-24 ENCOUNTER — Ambulatory Visit (INDEPENDENT_AMBULATORY_CARE_PROVIDER_SITE_OTHER): Payer: Medicare HMO | Admitting: Specialist

## 2016-10-24 VITALS — BP 160/97 | HR 96 | Ht 73.5 in | Wt 252.0 lb

## 2016-10-24 DIAGNOSIS — M24561 Contracture, right knee: Secondary | ICD-10-CM

## 2016-10-24 MED ORDER — OXYMORPHONE HCL ER 10 MG PO TB12: 10.0000 mg | ORAL_TABLET | Freq: Three times a day (TID) | ORAL | 0 refills | Status: DC

## 2016-10-24 MED ORDER — OXYCODONE HCL 10 MG PO TABS: 10.0000 mg | ORAL_TABLET | ORAL | 0 refills | Status: DC | PRN

## 2016-10-24 NOTE — Patient Instructions (Signed)
Knee is suffering from osteoarthritis, only real proven treatments are Weight loss, NSIADs like diclofenac and exercise. Well padded shoes help. Ice the knee 2-3 times a day 15-20 mins at a time. CPM will be requested through [place]. Need to work on stretching exercises of the right quadriceps and right quadriceps strengthening. See a physical therapist or participate with home help therapy that your report will start 10/27/2016

## 2016-10-24 NOTE — Progress Notes (Signed)
   Post-Op Visit Note   Patient: Dustin [name]           Date of Birth: 05/20/1965           MRN: [id number] Visit Date: 10/24/2016 PCP: Shelda Pal, DO   Assessment & Plan:2 weeks post right femur removal of distal interlocking screw and 2 proximal interlocking screws, excision of a large hematoma and hematoma cavity.  Doing well with improved ROM right knee.  Chief Complaint:  Chief Complaint  Patient presents with  . Right Thigh - Routine Post Op, Follow-up  ROm right knee 0-90 degrees with some more with stretching.  Visit Diagnoses: No diagnosis found.  Plan:Knee is suffering from osteoarthritis, only real proven treatments are Weight loss, NSIADs like diclofenac and exercise. Well padded shoes help. Ice the knee 2-3 times a day 15-20 mins at a time. CPM will be requested through [facility]. Need to work on stretching exercises of the right quadriceps and right quadriceps strengthening. See a physical therapist or participate with home help therapy that your report will start 10/27/2016  Follow-Up Instructions: Return in about 3 weeks (around 11/14/2016).   Orders:  No orders of the defined types were placed in this encounter.  No orders of the defined types were placed in this encounter.   Imaging: No results found.  PMFS History: Patient Active Problem List   Diagnosis Date Noted  . Traumatic seroma of thigh, right, sequela 09/11/2016    Priority: High    Class: Chronic  . Retained orthopedic hardware 09/11/2016    Priority: High    Class: Chronic  . Thigh hematoma, right, sequela 01/23/2016  . Essential hypertension 12/15/2015  . Distal radius fracture, left 04/02/2015  . Closed fracture of right femur ([place]) 04/01/2015   Past Medical History:  Diagnosis Date  . Arthritis    knee, back  . Chronic back pain   . Dyspnea    with exertion  . Hypertension   . Obesity   . PTSD (post-traumatic stress disorder)     Family History  Problem  Relation Age of Onset  . Colon cancer Neg Hx     Past Surgical History:  Procedure Laterality Date  . CERVICAL FUSION  2007   C 5, 6 ,7  . CLOSED REDUCTION WRIST FRACTURE Left 04/01/2015   Procedure: CLOSED REDUCTION WRIST AND CASTING REPAIR LACERATION TO FINGERS TIMES 2;  Surgeon: Dorna Leitz, MD;  Location: [place];  Service: Orthopedics;  Laterality: Left;  . FEMUR IM NAIL Right 04/01/2015   Procedure: INTRAMEDULLARY (IM) RETROGRADE FEMORAL NAILING ;  Surgeon: Dorna Leitz, MD;  Location: [place];  Service: Orthopedics;  Laterality: Right;  . HARDWARE REMOVAL Right 10/09/2016   Procedure: Dynamization of Nail Right  femur, removal of Screws and Excision of hematoma cavity right thigh;  Surgeon: Jessy Oto, MD;  Location: [place];  Service: Orthopedics;  Laterality: Right;  . HEMATOMA EVACUATION Right 10/09/2016   Procedure: EVACUATION HEMATOMA;  Surgeon: Jessy Oto, MD;  Location: [place];  Service: Orthopedics;  Laterality: Right;  . RESECTION DISTAL CLAVICAL Right 2009  . SHOULDER SURGERY Right    Social History   Occupational History  . Not on file.   Social History Main Topics  . Smoking status: Never Smoker  . Smokeless tobacco: Never Used  . Alcohol use Yes     Comment: occasional   . Drug use: No  . Sexual activity: Not on file

## 2016-10-29 ENCOUNTER — Ambulatory Visit: Payer: Medicare HMO

## 2016-10-29 NOTE — Telephone Encounter (Signed)
I faxed CPM order to [place] on Friday, 10/26/16

## 2016-10-29 NOTE — Telephone Encounter (Signed)
This patient has a quadriceps contracture, he had resection of a large thigh hematoma cavity, CPM would promote return of ROM but is not a substitute for physical therapy.

## 2016-10-30 ENCOUNTER — Telehealth (INDEPENDENT_AMBULATORY_CARE_PROVIDER_SITE_OTHER): Payer: Self-pay | Admitting: Radiology

## 2016-10-30 NOTE — Telephone Encounter (Signed)
Has then been taken care of?

## 2016-10-30 NOTE — Telephone Encounter (Signed)
Frequency and duration 1 week 1, and  3 week 3 for therapeutic exercise, gait training.  Please call Stanton Kidney back @  [phone] to [place].

## 2016-10-30 NOTE — Telephone Encounter (Signed)
I called and gave verbal ok for these orders

## 2016-10-31 ENCOUNTER — Encounter: Payer: Self-pay | Admitting: Family Medicine

## 2016-10-31 ENCOUNTER — Ambulatory Visit (INDEPENDENT_AMBULATORY_CARE_PROVIDER_SITE_OTHER): Payer: Medicare HMO | Admitting: Family Medicine

## 2016-10-31 VITALS — BP 170/110 | HR 99 | Temp 98.1°F | Ht 73.5 in | Wt 238.8 lb

## 2016-10-31 DIAGNOSIS — G8929 Other chronic pain: Secondary | ICD-10-CM

## 2016-10-31 DIAGNOSIS — M545 Low back pain: Secondary | ICD-10-CM | POA: Diagnosis not present

## 2016-10-31 DIAGNOSIS — I10 Essential (primary) hypertension: Secondary | ICD-10-CM | POA: Diagnosis not present

## 2016-10-31 NOTE — Progress Notes (Signed)
Chief Complaint  Patient presents with  . Acute Visit  . Medication Management    Pt would like to discuss his pain medication, he usually sees the pain clinic but ever since he had surgery he has been having trouble getting his medication     Subjective: Patient is a 51 y.o. male here for discussion of pain management.  The patient has a history of chronic low back pain on chronic narcotics. He currently follows with [facility] pain management. He is in the process of weaning down on the monitor opiates he takes. He does not wish to take these for prolonged periods of time. He recently had an orthopedic surgery done to remove the rod from his femur. While the symptoms of lower extremity pain from this issue have resolved, he is still having postoperative pain. There is a disagreement between his pain clinic and his orthopedic surgeon leaving him disgruntled with his current provider. He is asking for a referral to another pain management specialist.  He also has a history of high blood pressure. He is on the syncopal 20 mg daily. He is not compliant with his medicine. He does check his blood pressure and finds it to be in the 140-150s/90s. He has not been getting routine exercise due to his right leg issue. He states his diet is healthy getting lots of vegetables.   ROS: Heart: Denies chest pain  Lungs: Denies SOB   Family History  Problem Relation Age of Onset  . Colon cancer Neg Hx    Past Medical History:  Diagnosis Date  . Arthritis    knee, back  . Chronic back pain   . Dyspnea    with exertion  . Hypertension   . Obesity   . PTSD (post-traumatic stress disorder)    Allergies  Allergen Reactions  . Latex Rash and Swelling    Current Outpatient Prescriptions:  .  Ascorbic Acid (VITAMIN C PO), Take 1 tablet by mouth daily., Disp: , Rfl:  .  aspirin EC 325 MG tablet, Take 1 tablet (325 mg total) by mouth 2 (two) times daily after a meal., Disp: 60 tablet, Rfl: 0 .   cyclobenzaprine (FLEXERIL) 10 MG tablet, Take 1 tablet (10 mg total) by mouth 3 (three) times daily as needed for muscle spasms., Disp: 60 tablet, Rfl: 0 .  docusate sodium (COLACE) 100 MG capsule, Take 1 capsule (100 mg total) by mouth 2 (two) times daily., Disp: 40 capsule, Rfl: 0 .  lisinopril (PRINIVIL,ZESTRIL) 20 MG tablet, Take 1 tablet (20 mg total) by mouth daily., Disp: 30 tablet, Rfl: 3 .  Oxycodone HCl 10 MG TABS, Take 1 tablet (10 mg total) by mouth every 3 (three) hours as needed., Disp: 60 tablet, Rfl: 0 .  oxymorphone (OPANA ER) 10 MG 12 hr tablet, Take 1 tablet (10 mg total) by mouth every 8 (eight) hours., Disp: 42 tablet, Rfl: 0 .  cyclobenzaprine (FLEXERIL) 10 MG tablet, Take 10 mg by mouth 3 (three) times daily as needed for muscle spasms., Disp: , Rfl:  .  Oxycodone HCl 10 MG TABS, Take 10 mg by mouth every 8 (eight) hours as needed. for pain, Disp: , Rfl: 0  Objective: BP (!) 170/110 (BP Location: Left Arm, Patient Position: Sitting, Cuff Size: Normal)   Pulse 99   Temp 98.1 F (36.7 C) (Oral)   Ht 6' 1.5" (1.867 m)   Wt 238 lb 12.8 oz (108.3 kg)   SpO2 98%   BMI 31.08 kg/m  General: Awake, appears stated age 18: MMM, EOMi Heart: RRR, no murmurs Lungs: CTAB, no rales, wheezes or rhonchi. No accessory muscle use MSK: +TTP over lumbar paraspinal musculature and midline Psych: Age appropriate judgment and insight, normal affect and mood  Assessment and Plan: Chronic bilateral low back pain without sciatica - Plan: Ambulatory referral to Pain Clinic  Essential hypertension  Orders as above. Take Lisinopril daily. Counseled on diet and exercise.  F/u in 4 weeks to recheck BP.  The patient voiced understanding and agreement to the plan.  [place], DO 10/31/16  9:40 AM

## 2016-10-31 NOTE — Patient Instructions (Addendum)
If you do not hear anything about your referral in the next 1-2 weeks, call our office and ask for an update.  Take your blood pressure medicine every day. Once your blood pressure comes down, we can start coming down on the dose or come off altogether.   DASH Eating Plan DASH stands for "Dietary Approaches to Stop Hypertension." The DASH eating plan is a healthy eating plan that has been shown to reduce high blood pressure (hypertension). It may also reduce your risk for type 2 diabetes, heart disease, and stroke. The DASH eating plan may also help with weight loss. What are tips for following this plan? General guidelines  Avoid eating more than 2,300 mg (milligrams) of salt (sodium) a day. If you have hypertension, you may need to reduce your sodium intake to 1,500 mg a day.  Limit alcohol intake to no more than 1 drink a day for nonpregnant women and 2 drinks a day for men. One drink equals 12 oz of beer, 5 oz of wine, or 1 oz of hard liquor.  Work with your health care provider to maintain a healthy body weight or to lose weight. Ask what an ideal weight is for you.  Get at least 30 minutes of exercise that causes your heart to beat faster (aerobic exercise) most days of the week. Activities may include walking, swimming, or biking.  Work with your health care provider or diet and nutrition specialist (dietitian) to adjust your eating plan to your individual calorie needs. Reading food labels  Check food labels for the amount of sodium per serving. Choose foods with less than 5 percent of the Daily Value of sodium. Generally, foods with less than 300 mg of sodium per serving fit into this eating plan.  To find whole grains, look for the word "whole" as the first word in the ingredient list. Shopping  Buy products labeled as "low-sodium" or "no salt added."  Buy fresh foods. Avoid canned foods and premade or frozen meals. Cooking  Avoid adding salt when cooking. Use salt-free  seasonings or herbs instead of table salt or sea salt. Check with your health care provider or pharmacist before using salt substitutes.  Do not fry foods. Cook foods using healthy methods such as baking, boiling, grilling, and broiling instead.  Cook with heart-healthy oils, such as olive, canola, soybean, or sunflower oil. Meal planning   Eat a balanced diet that includes: ? 5 or more servings of fruits and vegetables each day. At each meal, try to fill half of your plate with fruits and vegetables. ? Up to 6-8 servings of whole grains each day. ? Less than 6 oz of lean meat, poultry, or fish each day. A 3-oz serving of meat is about the same size as a deck of cards. One egg equals 1 oz. ? 2 servings of low-fat dairy each day. ? A serving of nuts, seeds, or beans 5 times each week. ? Heart-healthy fats. Healthy fats called Omega-3 fatty acids are found in foods such as flaxseeds and coldwater fish, like sardines, salmon, and mackerel.  Limit how much you eat of the following: ? Canned or prepackaged foods. ? Food that is high in trans fat, such as fried foods. ? Food that is high in saturated fat, such as fatty meat. ? Sweets, desserts, sugary drinks, and other foods with added sugar. ? Full-fat dairy products.  Do not salt foods before eating.  Try to eat at least 2 vegetarian meals each week.  Eat more home-cooked food and less restaurant, buffet, and fast food.  When eating at a restaurant, ask that your food be prepared with less salt or no salt, if possible. What foods are recommended? The items listed may not be a complete list. Talk with your dietitian about what dietary choices are best for you. Grains Whole-grain or whole-wheat bread. Whole-grain or whole-wheat pasta. Brown rice. Modena Morrow. Bulgur. Whole-grain and low-sodium cereals. Pita bread. Low-fat, low-sodium crackers. Whole-wheat flour tortillas. Vegetables Fresh or frozen vegetables (raw, steamed, roasted,  or grilled). Low-sodium or reduced-sodium tomato and vegetable juice. Low-sodium or reduced-sodium tomato sauce and tomato paste. Low-sodium or reduced-sodium canned vegetables. Fruits All fresh, dried, or frozen fruit. Canned fruit in natural juice (without added sugar). Meat and other protein foods Skinless chicken or [place]. Ground chicken or [place]. Pork with fat trimmed off. Fish and seafood. Egg whites. Dried beans, peas, or lentils. Unsalted nuts, nut butters, and seeds. Unsalted canned beans. Lean cuts of beef with fat trimmed off. Low-sodium, lean deli meat. Dairy Low-fat (1%) or fat-free (skim) milk. Fat-free, low-fat, or reduced-fat cheeses. Nonfat, low-sodium ricotta or cottage cheese. Low-fat or nonfat yogurt. Low-fat, low-sodium cheese. Fats and oils Soft margarine without trans fats. Vegetable oil. Low-fat, reduced-fat, or light mayonnaise and salad dressings (reduced-sodium). Canola, safflower, olive, soybean, and sunflower oils. Avocado. Seasoning and other foods Herbs. Spices. Seasoning mixes without salt. Unsalted popcorn and pretzels. Fat-free sweets. What foods are not recommended? The items listed may not be a complete list. Talk with your dietitian about what dietary choices are best for you. Grains Baked goods made with fat, such as croissants, muffins, or some breads. Dry pasta or rice meal packs. Vegetables Creamed or fried vegetables. Vegetables in a cheese sauce. Regular canned vegetables (not low-sodium or reduced-sodium). Regular canned tomato sauce and paste (not low-sodium or reduced-sodium). Regular tomato and vegetable juice (not low-sodium or reduced-sodium). Angie Fava. Olives. Fruits Canned fruit in a light or heavy syrup. Fried fruit. Fruit in cream or butter sauce. Meat and other protein foods Fatty cuts of meat. Ribs. Fried meat. Berniece Salines. Sausage. Bologna and other processed lunch meats. Salami. Fatback. Hotdogs. Bratwurst. Salted nuts and seeds. Canned beans  with added salt. Canned or smoked fish. Whole eggs or egg yolks. Chicken or [place] with skin. Dairy Whole or 2% milk, cream, and half-and-half. Whole or full-fat cream cheese. Whole-fat or sweetened yogurt. Full-fat cheese. Nondairy creamers. Whipped toppings. Processed cheese and cheese spreads. Fats and oils Butter. Stick margarine. Lard. Shortening. Ghee. Bacon fat. Tropical oils, such as coconut, palm kernel, or palm oil. Seasoning and other foods Salted popcorn and pretzels. Onion salt, garlic salt, seasoned salt, table salt, and sea salt. Worcestershire sauce. Tartar sauce. Barbecue sauce. Teriyaki sauce. Soy sauce, including reduced-sodium. Steak sauce. Canned and packaged gravies. Fish sauce. Oyster sauce. Cocktail sauce. Horseradish that you find on the shelf. Ketchup. Mustard. Meat flavorings and tenderizers. Bouillon cubes. Hot sauce and Tabasco sauce. Premade or packaged marinades. Premade or packaged taco seasonings. Relishes. Regular salad dressings. Where to find more information:  National Heart, Lung, and [place]: https://wilson-eaton.com/  American Heart Association: www.heart.org Summary  The DASH eating plan is a healthy eating plan that has been shown to reduce high blood pressure (hypertension). It may also reduce your risk for type 2 diabetes, heart disease, and stroke.  With the DASH eating plan, you should limit salt (sodium) intake to 2,300 mg a day. If you have hypertension, you may need to reduce your sodium intake  to 1,500 mg a day.  When on the DASH eating plan, aim to eat more fresh fruits and vegetables, whole grains, lean proteins, low-fat dairy, and heart-healthy fats.  Work with your health care provider or diet and nutrition specialist (dietitian) to adjust your eating plan to your individual calorie needs. This information is not intended to replace advice given to you by your health care provider. Make sure you discuss any questions you have with your health  care provider. Document Released: 01/25/2011 Document Revised: 01/30/2016 Document Reviewed: 01/30/2016 Elsevier Interactive Patient Education  2017 [employer].

## 2016-11-05 ENCOUNTER — Telehealth (INDEPENDENT_AMBULATORY_CARE_PROVIDER_SITE_OTHER): Payer: Self-pay | Admitting: Specialist

## 2016-11-05 ENCOUNTER — Telehealth: Payer: Self-pay | Admitting: Family Medicine

## 2016-11-05 NOTE — Telephone Encounter (Signed)
Patient brought in a prescription over the weekend for oxymorphone 10 mg tablet.  Clemons from [facility] wants to know if this was an add on medication.  CB#[phone].  Thank you.

## 2016-11-05 NOTE — Telephone Encounter (Signed)
Pt brought DMV placard app for phys to complete and sign. Call pt [phone] when ready for pick up. Placed form in provider tray at front office check in area.

## 2016-11-05 NOTE — Telephone Encounter (Signed)
°  Relation to CE:QFDV Call back number:[phone] Pharmacy:  Reason for call:  Patient requesting pain management referral to Burtis Junes MD, please advise

## 2016-11-06 ENCOUNTER — Telehealth: Payer: Self-pay | Admitting: *Deleted

## 2016-11-06 NOTE — Telephone Encounter (Signed)
Patient calling back with the following information:  [place] [address] [phone] FAX

## 2016-11-06 NOTE — Telephone Encounter (Signed)
OK to refer. I think we may have one out already for this patient?

## 2016-11-06 NOTE — Telephone Encounter (Signed)
Unable to find Dr Primus Bravo, patient is going to call me back with info

## 2016-11-06 NOTE — Telephone Encounter (Signed)
Completed Physician section on Disability Parking Placard Application; forwarded to provider/SLS 09/18

## 2016-11-06 NOTE — Telephone Encounter (Signed)
Note Copied from Closed 11/05/16: Telephone  11/05/2016 [facility], [place], [place]  Family Medicine   Conversation  (Newest Message First)  Helyn App    11/05/16 4:09 PM  Note    Pt brought DMV placard app for phys to complete and sign. Call pt [phone] when ready for pick up. Placed form in provider tray at front office check in area

## 2016-11-08 ENCOUNTER — Encounter (INDEPENDENT_AMBULATORY_CARE_PROVIDER_SITE_OTHER): Payer: Self-pay | Admitting: Orthopaedic Surgery

## 2016-11-08 ENCOUNTER — Ambulatory Visit (INDEPENDENT_AMBULATORY_CARE_PROVIDER_SITE_OTHER): Payer: Medicare HMO | Admitting: Orthopaedic Surgery

## 2016-11-08 DIAGNOSIS — M25461 Effusion, right knee: Secondary | ICD-10-CM | POA: Diagnosis not present

## 2016-11-08 DIAGNOSIS — M25561 Pain in right knee: Secondary | ICD-10-CM | POA: Diagnosis not present

## 2016-11-08 NOTE — Progress Notes (Signed)
Office Visit Note   Patient: Dustin [name]           Date of Birth: Apr 01, 1965           MRN: [id number] Visit Date: 11/08/2016              Requested by: Shelda Pal, [address] [place], [place] [address] PCP: Shelda Pal, DO   Assessment & Plan: Visit Diagnoses:  1. Effusion, right knee     Plan: 90 mL of yellowish cloudy fluid was aspirated from the right knee under sterile conditions. This will be sent off for analysis. Differential is gout versus septic arthritis. We will obtain blood work today including CRP, ESR, CBC differential, blood cultures. Clinically speaking the patient is nontoxic appearing. We will follow-up with the results and contact the patient with further instructions. Patient was evaluated with Dr. Louanne Skye.  Follow-Up Instructions: Return if symptoms worsen or fail to improve.   Orders:  No orders of the defined types were placed in this encounter.  No orders of the defined types were placed in this encounter.     Procedures: Large Joint Inj Date/Time: 11/08/2016 4:53 PM Performed by: Leandrew Koyanagi Authorized by: Leandrew Koyanagi   Consent Given by:  Patient Timeout: prior to procedure the correct patient, procedure, and site was verified   Indications:  Pain Location:  Knee Site:  R knee Prep: patient was prepped and draped in usual sterile fashion   Needle Size:  18 G Approach:  Lateral Ultrasound Guidance: No   Fluoroscopic Guidance: No   Arthrogram: No   Aspiration Attempted: Yes   Aspirate amount (mL):  90 Aspirate:  Cloudy Patient tolerance:  Patient tolerated the procedure well with no immediate complications     Clinical Data: No additional findings.   Subjective: Chief Complaint  Patient presents with  . Right Knee - Pain, Routine Post Op, Edema    Patient is a 51 year old gentleman who is 30 days status post hardware removal from his right femoral nail and evacuation of hematoma who  comes in with 1 day history of right knee swelling and pain. Denies any constitutional symptoms. He ambulates with a walker.    Review of Systems  Constitutional: Negative.   All other systems reviewed and are negative.    Objective: Vital Signs: There were no vitals taken for this visit.  Physical Exam  Constitutional: He is oriented to person, place, and time. He appears well-developed and well-nourished.  Pulmonary/Chest: Effort normal.  Abdominal: Soft.  Neurological: He is alert and oriented to person, place, and time.  Skin: Skin is warm.  Psychiatric: He has a normal mood and affect. His behavior is normal. Judgment and thought content normal.  Nursing note and vitals reviewed.   Ortho Exam Right knee exam shows no warmth or cellulitis. He does have a large effusion with limited range of motion. Ligamentously stable. Specialty Comments:  No specialty comments available.  Imaging: No results found.   PMFS History: Patient Active Problem List   Diagnosis Date Noted  . Effusion, right knee 11/08/2016  . 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 Retained orthopedic hardware 09/11/2016    Class: Chronic  . Thigh hematoma, right, sequela 01/23/2016  . Essential hypertension 12/15/2015  . Distal radius fracture, left 04/02/2015  . Closed fracture of right femur ([place]) 04/01/2015   Past Medical History:  Diagnosis Date  . Arthritis  knee, back  . Chronic back pain   . Dyspnea    with exertion  . Hypertension   . Obesity   . PTSD (post-traumatic stress disorder)     Family History  Problem Relation Age of Onset  . Colon cancer Neg Hx     Past Surgical History:  Procedure Laterality Date  . CERVICAL FUSION  2007   C 5, 6 ,7  . CLOSED REDUCTION WRIST FRACTURE Left 04/01/2015   Procedure: CLOSED REDUCTION WRIST AND CASTING REPAIR LACERATION TO FINGERS TIMES 2;  Surgeon: Dorna Leitz, MD;  Location: [facility];  Service:  Orthopedics;  Laterality: Left;  . FEMUR IM NAIL Right 04/01/2015   Procedure: INTRAMEDULLARY (IM) RETROGRADE FEMORAL NAILING ;  Surgeon: Dorna Leitz, MD;  Location: [facility];  Service: Orthopedics;  Laterality: Right;  . HARDWARE REMOVAL Right 10/09/2016   Procedure: Dynamization of Nail Right  femur, removal of Screws and Excision of hematoma cavity right thigh;  Surgeon: Jessy Oto, MD;  Location: [facility];  Service: Orthopedics;  Laterality: Right;  . HEMATOMA EVACUATION Right 10/09/2016   Procedure: EVACUATION HEMATOMA;  Surgeon: Jessy Oto, MD;  Location: [facility];  Service: Orthopedics;  Laterality: Right;  . RESECTION DISTAL CLAVICAL Right 2009  . SHOULDER SURGERY Right    Social History   Occupational History  . Not on file.   Social History Main Topics  . Smoking status: Never Smoker  . Smokeless tobacco: Never Used  . Alcohol use Yes     Comment: occasional   . Drug use: No  . Sexual activity: Not on file

## 2016-11-08 NOTE — Telephone Encounter (Signed)
Patient brought in a prescription over the weekend for oxymorphone 10 mg tablet.  Clemons from [facility] wants to know if this was an add on medication.  CB#[phone].  Thank you.

## 2016-11-09 ENCOUNTER — Inpatient Hospital Stay (HOSPITAL_COMMUNITY)
Admission: AD | Admit: 2016-11-09 | Discharge: 2016-11-17 | DRG: 550 | Disposition: A | Discharge status: Home Health | Payer: Medicare HMO | Source: Ambulatory Visit | Attending: Specialist | Admitting: Specialist

## 2016-11-09 ENCOUNTER — Inpatient Hospital Stay (HOSPITAL_COMMUNITY): Payer: Medicare HMO

## 2016-11-09 ENCOUNTER — Inpatient Hospital Stay (HOSPITAL_COMMUNITY): Payer: Medicare HMO | Admitting: Certified Registered"

## 2016-11-09 ENCOUNTER — Encounter (HOSPITAL_COMMUNITY): Payer: Self-pay | Admitting: Orthopedic Surgery

## 2016-11-09 ENCOUNTER — Other Ambulatory Visit (INDEPENDENT_AMBULATORY_CARE_PROVIDER_SITE_OTHER): Payer: Self-pay | Admitting: Specialist

## 2016-11-09 ENCOUNTER — Encounter (HOSPITAL_COMMUNITY)
Admission: AD | Disposition: A | Discharge status: Home Health | Payer: Self-pay | Source: Ambulatory Visit | Attending: Specialist

## 2016-11-09 ENCOUNTER — Encounter (INDEPENDENT_AMBULATORY_CARE_PROVIDER_SITE_OTHER): Payer: Self-pay | Admitting: Specialist

## 2016-11-09 DIAGNOSIS — E669 Obesity, unspecified: Secondary | ICD-10-CM | POA: Diagnosis present

## 2016-11-09 DIAGNOSIS — Z6831 Body mass index (BMI) 31.0-31.9, adult: Secondary | ICD-10-CM | POA: Diagnosis not present

## 2016-11-09 DIAGNOSIS — Z7982 Long term (current) use of aspirin: Secondary | ICD-10-CM | POA: Diagnosis not present

## 2016-11-09 DIAGNOSIS — Z969 Presence of functional implant, unspecified: Secondary | ICD-10-CM

## 2016-11-09 DIAGNOSIS — B9562 Methicillin resistant Staphylococcus aureus infection as the cause of diseases classified elsewhere: Secondary | ICD-10-CM | POA: Diagnosis present

## 2016-11-09 DIAGNOSIS — Z79899 Other long term (current) drug therapy: Secondary | ICD-10-CM | POA: Diagnosis not present

## 2016-11-09 DIAGNOSIS — G894 Chronic pain syndrome: Secondary | ICD-10-CM | POA: Diagnosis present

## 2016-11-09 DIAGNOSIS — M009 Pyogenic arthritis, unspecified: Secondary | ICD-10-CM | POA: Diagnosis not present

## 2016-11-09 DIAGNOSIS — F4310 Post-traumatic stress disorder, unspecified: Secondary | ICD-10-CM | POA: Diagnosis present

## 2016-11-09 DIAGNOSIS — T792XXS Traumatic secondary and recurrent hemorrhage and seroma, sequela: Secondary | ICD-10-CM

## 2016-11-09 DIAGNOSIS — I10 Essential (primary) hypertension: Secondary | ICD-10-CM | POA: Diagnosis present

## 2016-11-09 DIAGNOSIS — Z981 Arthrodesis status: Secondary | ICD-10-CM | POA: Diagnosis not present

## 2016-11-09 DIAGNOSIS — Z01818 Encounter for other preprocedural examination: Secondary | ICD-10-CM

## 2016-11-09 DIAGNOSIS — M25561 Pain in right knee: Secondary | ICD-10-CM | POA: Diagnosis not present

## 2016-11-09 HISTORY — DX: Low back pain, unspecified: M54.50

## 2016-11-09 HISTORY — DX: Other chronic pain: G89.29

## 2016-11-09 HISTORY — PX: KNEE ARTHROSCOPY: SHX127

## 2016-11-09 HISTORY — DX: Low back pain: M54.5

## 2016-11-09 LAB — COMPREHENSIVE METABOLIC PANEL
ALK PHOS: 87 U/L (ref 38–126)
ALT: 14 U/L — AB (ref 17–63)
AST: 15 U/L (ref 15–41)
Albumin: 3.4 g/dL — ABNORMAL LOW (ref 3.5–5.0)
Anion gap: 7 (ref 5–15)
BUN: 7 mg/dL (ref 6–20)
CALCIUM: 9.1 mg/dL (ref 8.9–10.3)
CHLORIDE: 108 mmol/L (ref 101–111)
CO2: 26 mmol/L (ref 22–32)
CREATININE: 0.8 mg/dL (ref 0.61–1.24)
GFR calc non Af Amer: >60 mL/min (ref >60)
Glucose, Bld: 102 mg/dL — ABNORMAL HIGH (ref 65–99)
Potassium: 3.6 mmol/L (ref 3.5–5.1)
SODIUM: 141 mmol/L (ref 135–145)
Total Bilirubin: 1.3 mg/dL — ABNORMAL HIGH (ref 0.3–1.2)
Total Protein: 7 g/dL (ref 6.5–8.1)

## 2016-11-09 LAB — CBC
HCT: 41.9 % (ref 39.0–52.0)
HEMOGLOBIN: 13.5 g/dL (ref 13.0–17.0)
MCH: 28.1 pg (ref 26.0–34.0)
MCHC: 32.2 g/dL (ref 30.0–36.0)
MCV: 87.1 fL (ref 78.0–100.0)
PLATELETS: 266 10*3/uL (ref 150–400)
RBC: 4.81 MIL/uL (ref 4.22–5.81)
RDW: 14.2 % (ref 11.5–15.5)
WBC: 9.2 10*3/uL (ref 4.0–10.5)

## 2016-11-09 LAB — CELL COUNT AND DIFF, FLUID, OTHER
Basophils, %: 0 %
Eosinophils, %: 0 %
Lymphocytes, %: 1 %
MESOTHELIAL, %: 0 %
MONOCYTE/MACROPHAGE %: 2 %
Neutrophils, %: 97 %
TOTAL NUCLEATED CELL CT: 177200 {cells}/uL

## 2016-11-09 LAB — GRAM STAIN
MICRO NUMBER: 81043878
SPECIMEN QUALITY: ADEQUATE

## 2016-11-09 LAB — SURGICAL PCR SCREEN
MRSA, PCR: POSITIVE — AB
Staphylococcus aureus: POSITIVE — AB

## 2016-11-09 LAB — SEDIMENTATION RATE: SED RATE: 39 mm/h — AB (ref 0–16)

## 2016-11-09 LAB — C-REACTIVE PROTEIN: CRP: 9.6 mg/dL — ABNORMAL HIGH (ref <1.0)

## 2016-11-09 SURGERY — ARTHROSCOPY, KNEE
Anesthesia: General | Site: Knee | Laterality: Right

## 2016-11-09 MED ORDER — POVIDONE-IODINE 10 % EX SWAB: 2.0000 "application " | Freq: Once | CUTANEOUS | Status: DC

## 2016-11-09 MED ORDER — VANCOMYCIN HCL 1000 MG IV SOLR
INTRAVENOUS | Status: DC | PRN
  Administered 2016-11-09: 1000 mg via INTRAVENOUS

## 2016-11-09 MED ORDER — METOCLOPRAMIDE HCL 5 MG/ML IJ SOLN: 5.0000 mg | Freq: Three times a day (TID) | INTRAMUSCULAR | Status: DC | PRN

## 2016-11-09 MED ORDER — CHLORHEXIDINE GLUCONATE 4 % EX LIQD: 60.0000 mL | Freq: Once | CUTANEOUS | Status: DC

## 2016-11-09 MED ORDER — OXYCODONE HCL 5 MG PO TABS
10.0000 mg | ORAL_TABLET | ORAL | Status: DC | PRN
  Administered 2016-11-09 – 2016-11-11 (×13): 10 mg via ORAL
  Filled 2016-11-09 (×13): qty 2

## 2016-11-09 MED ORDER — HYDROMORPHONE HCL 1 MG/ML IJ SOLN
INTRAMUSCULAR | Status: AC
  Filled 2016-11-09: qty 0.5

## 2016-11-09 MED ORDER — ONDANSETRON HCL 4 MG PO TABS: 4.0000 mg | ORAL_TABLET | Freq: Four times a day (QID) | ORAL | Status: DC | PRN

## 2016-11-09 MED ORDER — KETOROLAC TROMETHAMINE 15 MG/ML IJ SOLN
15.0000 mg | Freq: Four times a day (QID) | INTRAMUSCULAR | Status: AC
  Administered 2016-11-10 (×4): 15 mg via INTRAVENOUS
  Filled 2016-11-09 (×4): qty 1

## 2016-11-09 MED ORDER — FLEET ENEMA 7-19 GM/118ML RE ENEM: 1.0000 | ENEMA | Freq: Once | RECTAL | Status: DC | PRN

## 2016-11-09 MED ORDER — POLYETHYLENE GLYCOL 3350 17 G PO PACK
17.0000 g | PACK | Freq: Every day | ORAL | Status: DC | PRN
  Filled 2016-11-09: qty 1

## 2016-11-09 MED ORDER — ONDANSETRON HCL 4 MG/2ML IJ SOLN: 4.0000 mg | Freq: Four times a day (QID) | INTRAMUSCULAR | Status: DC | PRN

## 2016-11-09 MED ORDER — PHENYLEPHRINE HCL 10 MG/ML IJ SOLN
INTRAMUSCULAR | Status: DC | PRN
  Administered 2016-11-09: 80 ug via INTRAVENOUS

## 2016-11-09 MED ORDER — MUPIROCIN 2 % EX OINT
1.0000 "application " | TOPICAL_OINTMENT | Freq: Two times a day (BID) | CUTANEOUS | Status: AC
  Administered 2016-11-09 – 2016-11-14 (×10): 1 via NASAL
  Filled 2016-11-09: qty 22

## 2016-11-09 MED ORDER — SUGAMMADEX SODIUM 500 MG/5ML IV SOLN
INTRAVENOUS | Status: AC
  Filled 2016-11-09: qty 5

## 2016-11-09 MED ORDER — MORPHINE SULFATE ER 15 MG PO TBCR: 30.0000 mg | EXTENDED_RELEASE_TABLET | Freq: Two times a day (BID) | ORAL | Status: DC

## 2016-11-09 MED ORDER — PROPOFOL 10 MG/ML IV BOLUS
INTRAVENOUS | Status: AC
  Filled 2016-11-09: qty 20

## 2016-11-09 MED ORDER — MORPHINE SULFATE (PF) 4 MG/ML IV SOLN
1.0000 mg | INTRAVENOUS | Status: DC | PRN
  Administered 2016-11-10 (×2): 1 mg via INTRAVENOUS
  Filled 2016-11-09 (×2): qty 1

## 2016-11-09 MED ORDER — HYDROMORPHONE HCL 1 MG/ML IJ SOLN
INTRAMUSCULAR | Status: DC | PRN
  Administered 2016-11-09 (×2): 0.5 mg via INTRAVENOUS

## 2016-11-09 MED ORDER — FENTANYL CITRATE (PF) 250 MCG/5ML IJ SOLN
INTRAMUSCULAR | Status: AC
  Filled 2016-11-09: qty 5

## 2016-11-09 MED ORDER — ASPIRIN EC 325 MG PO TBEC
325.0000 mg | DELAYED_RELEASE_TABLET | Freq: Two times a day (BID) | ORAL | Status: DC
  Administered 2016-11-10 – 2016-11-17 (×15): 325 mg via ORAL
  Filled 2016-11-09 (×15): qty 1

## 2016-11-09 MED ORDER — FENTANYL CITRATE (PF) 100 MCG/2ML IJ SOLN
INTRAMUSCULAR | Status: DC | PRN
  Administered 2016-11-09: 100 ug via INTRAVENOUS
  Administered 2016-11-09: 50 ug via INTRAVENOUS
  Administered 2016-11-09: 100 ug via INTRAVENOUS

## 2016-11-09 MED ORDER — ONDANSETRON HCL 4 MG/2ML IJ SOLN
INTRAMUSCULAR | Status: AC
  Filled 2016-11-09: qty 2

## 2016-11-09 MED ORDER — VANCOMYCIN HCL IN DEXTROSE 1-5 GM/200ML-% IV SOLN
1000.0000 mg | INTRAVENOUS | Status: AC
  Administered 2016-11-09: 1000 mg via INTRAVENOUS
  Filled 2016-11-09: qty 200

## 2016-11-09 MED ORDER — VANCOMYCIN HCL IN DEXTROSE 1-5 GM/200ML-% IV SOLN
INTRAVENOUS | Status: AC
  Filled 2016-11-09: qty 200

## 2016-11-09 MED ORDER — ONDANSETRON HCL 4 MG/2ML IJ SOLN: 4.0000 mg | Freq: Once | INTRAMUSCULAR | Status: DC | PRN

## 2016-11-09 MED ORDER — DEXAMETHASONE SODIUM PHOSPHATE 10 MG/ML IJ SOLN: INTRAMUSCULAR | Status: DC | PRN

## 2016-11-09 MED ORDER — LIDOCAINE 2% (20 MG/ML) 5 ML SYRINGE
INTRAMUSCULAR | Status: DC | PRN
  Administered 2016-11-09: 60 mg via INTRAVENOUS

## 2016-11-09 MED ORDER — DOCUSATE SODIUM 100 MG PO CAPS
100.0000 mg | ORAL_CAPSULE | Freq: Two times a day (BID) | ORAL | Status: DC
  Administered 2016-11-09 – 2016-11-17 (×14): 100 mg via ORAL
  Filled 2016-11-09 (×16): qty 1

## 2016-11-09 MED ORDER — LACTATED RINGERS IV SOLN
INTRAVENOUS | Status: DC | PRN
  Administered 2016-11-09: 20:00:00 via INTRAVENOUS

## 2016-11-09 MED ORDER — BISACODYL 10 MG RE SUPP: 10.0000 mg | Freq: Every day | RECTAL | Status: DC | PRN

## 2016-11-09 MED ORDER — OXYCODONE HCL 5 MG PO TABS: 5.0000 mg | ORAL_TABLET | Freq: Once | ORAL | Status: DC | PRN

## 2016-11-09 MED ORDER — MIDAZOLAM HCL 5 MG/5ML IJ SOLN
INTRAMUSCULAR | Status: DC | PRN
  Administered 2016-11-09: 2 mg via INTRAVENOUS

## 2016-11-09 MED ORDER — SUCCINYLCHOLINE CHLORIDE 200 MG/10ML IV SOSY
PREFILLED_SYRINGE | INTRAVENOUS | Status: AC
  Filled 2016-11-09: qty 10

## 2016-11-09 MED ORDER — FENTANYL CITRATE (PF) 100 MCG/2ML IJ SOLN
25.0000 ug | INTRAMUSCULAR | Status: DC | PRN
  Administered 2016-11-09 (×3): 50 ug via INTRAVENOUS

## 2016-11-09 MED ORDER — OXYCODONE HCL 5 MG/5ML PO SOLN: 5.0000 mg | Freq: Once | ORAL | Status: DC | PRN

## 2016-11-09 MED ORDER — BACITRACIN 50000 UNITS IM SOLR
INTRAMUSCULAR | Status: DC | PRN
Start: 2016-11-09 — End: 2016-11-09
  Administered 2016-11-09: 500 mL

## 2016-11-09 MED ORDER — LIDOCAINE 2% (20 MG/ML) 5 ML SYRINGE
INTRAMUSCULAR | Status: AC
  Filled 2016-11-09: qty 5

## 2016-11-09 MED ORDER — PROPOFOL 10 MG/ML IV BOLUS
INTRAVENOUS | Status: DC | PRN
  Administered 2016-11-09: 200 mg via INTRAVENOUS

## 2016-11-09 MED ORDER — DEXAMETHASONE SODIUM PHOSPHATE 10 MG/ML IJ SOLN
INTRAMUSCULAR | Status: AC
  Filled 2016-11-09: qty 1

## 2016-11-09 MED ORDER — SODIUM CHLORIDE 0.9 % IR SOLN
Status: DC | PRN
  Administered 2016-11-09 (×3): 3000 mL

## 2016-11-09 MED ORDER — FENTANYL CITRATE (PF) 100 MCG/2ML IJ SOLN
INTRAMUSCULAR | Status: AC
  Administered 2016-11-09: 50 ug via INTRAVENOUS
  Filled 2016-11-09: qty 2

## 2016-11-09 MED ORDER — CHLORHEXIDINE GLUCONATE CLOTH 2 % EX PADS
6.0000 | MEDICATED_PAD | Freq: Every day | CUTANEOUS | Status: AC
  Administered 2016-11-11 – 2016-11-13 (×3): 6 via TOPICAL

## 2016-11-09 MED ORDER — VANCOMYCIN HCL IN DEXTROSE 1-5 GM/200ML-% IV SOLN
1000.0000 mg | Freq: Two times a day (BID) | INTRAVENOUS | Status: DC
  Administered 2016-11-10 – 2016-11-12 (×5): 1000 mg via INTRAVENOUS
  Filled 2016-11-09 (×6): qty 200

## 2016-11-09 MED ORDER — SODIUM CHLORIDE 0.9 % IV SOLN
INTRAVENOUS | Status: DC
  Administered 2016-11-09 – 2016-11-15 (×5): via INTRAVENOUS

## 2016-11-09 MED ORDER — ONDANSETRON HCL 4 MG/2ML IJ SOLN
INTRAMUSCULAR | Status: DC | PRN
  Administered 2016-11-09: 4 mg via INTRAVENOUS

## 2016-11-09 MED ORDER — MIDAZOLAM HCL 2 MG/2ML IJ SOLN
INTRAMUSCULAR | Status: AC
  Filled 2016-11-09: qty 2

## 2016-11-09 MED ORDER — METOCLOPRAMIDE HCL 5 MG PO TABS: 5.0000 mg | ORAL_TABLET | Freq: Three times a day (TID) | ORAL | Status: DC | PRN

## 2016-11-09 MED ORDER — CYCLOBENZAPRINE HCL 10 MG PO TABS
10.0000 mg | ORAL_TABLET | Freq: Three times a day (TID) | ORAL | Status: DC | PRN
  Administered 2016-11-09 – 2016-11-17 (×11): 10 mg via ORAL
  Filled 2016-11-09 (×14): qty 1

## 2016-11-09 MED ORDER — ACETAMINOPHEN 325 MG PO TABS
650.0000 mg | ORAL_TABLET | Freq: Four times a day (QID) | ORAL | Status: DC | PRN
  Administered 2016-11-10 – 2016-11-15 (×4): 650 mg via ORAL
  Filled 2016-11-09 (×4): qty 2

## 2016-11-09 MED ORDER — LISINOPRIL 20 MG PO TABS
20.0000 mg | ORAL_TABLET | Freq: Every day | ORAL | Status: DC
  Administered 2016-11-10 – 2016-11-12 (×2): 20 mg via ORAL
  Filled 2016-11-09 (×8): qty 1

## 2016-11-09 MED ORDER — DOCUSATE SODIUM 100 MG PO CAPS: 100.0000 mg | ORAL_CAPSULE | Freq: Two times a day (BID) | ORAL | Status: DC

## 2016-11-09 MED ORDER — VITAMIN C 500 MG PO TABS
500.0000 mg | ORAL_TABLET | Freq: Every day | ORAL | Status: DC
  Administered 2016-11-11 – 2016-11-17 (×7): 500 mg via ORAL
  Filled 2016-11-09 (×8): qty 1

## 2016-11-09 MED ORDER — ROCURONIUM BROMIDE 10 MG/ML (PF) SYRINGE
PREFILLED_SYRINGE | INTRAVENOUS | Status: AC
  Filled 2016-11-09: qty 5

## 2016-11-09 MED ORDER — ACETAMINOPHEN 650 MG RE SUPP: 650.0000 mg | Freq: Four times a day (QID) | RECTAL | Status: DC | PRN

## 2016-11-09 SURGICAL SUPPLY — 55 items
BANDAGE ACE 6X5 VEL STRL LF (GAUZE/BANDAGES/DRESSINGS) ×3 IMPLANT
BANDAGE ELASTIC 6 VELCRO ST LF (GAUZE/BANDAGES/DRESSINGS) ×3 IMPLANT
BANDAGE ESMARK 6X9 LF (GAUZE/BANDAGES/DRESSINGS) ×1 IMPLANT
BLADE CLIPPER SURG (BLADE) IMPLANT
BLADE CUDA 5.5 (BLADE) IMPLANT
BLADE GREAT WHITE 4.2 (BLADE) IMPLANT
BLADE GREAT WHITE 4.2MM (BLADE)
BNDG ESMARK 6X9 LF (GAUZE/BANDAGES/DRESSINGS) ×3
BUR OVAL 6.0 (BURR) IMPLANT
CANNULA SHOULDER 7CM (CANNULA) ×3 IMPLANT
CONT SPECI 4OZ STER CLIK (MISCELLANEOUS) ×3 IMPLANT
CUFF TOURNIQUET SINGLE 34IN LL (TOURNIQUET CUFF) IMPLANT
CUFF TOURNIQUET SINGLE 44IN (TOURNIQUET CUFF) IMPLANT
CUTTER MENISCUS 3.5MM 6/BX (BLADE) ×3 IMPLANT
DRAPE ARTHROSCOPY W/POUCH 114 (DRAPES) ×3 IMPLANT
DRAPE HALF SHEET 40X57 (DRAPES) ×3 IMPLANT
DRAPE U-SHAPE 47X51 STRL (DRAPES) ×3 IMPLANT
DRSG ADAPTIC 3X8 NADH LF (GAUZE/BANDAGES/DRESSINGS) ×3 IMPLANT
DURAPREP 26ML APPLICATOR (WOUND CARE) ×3 IMPLANT
EVACUATOR 3/16  PVC DRAIN (DRAIN) ×2
EVACUATOR 3/16 PVC DRAIN (DRAIN) ×1 IMPLANT
GAUZE SPONGE 4X4 12PLY STRL (GAUZE/BANDAGES/DRESSINGS) ×3 IMPLANT
GAUZE XEROFORM 1X8 LF (GAUZE/BANDAGES/DRESSINGS) ×3 IMPLANT
GLOVE BIOGEL PI IND STRL 7.0 (GLOVE) ×1 IMPLANT
GLOVE BIOGEL PI IND STRL 8 (GLOVE) ×1 IMPLANT
GLOVE BIOGEL PI IND STRL 9 (GLOVE) ×1 IMPLANT
GLOVE BIOGEL PI INDICATOR 7.0 (GLOVE) ×2
GLOVE BIOGEL PI INDICATOR 8 (GLOVE) ×2
GLOVE BIOGEL PI INDICATOR 9 (GLOVE) ×2
GLOVE ECLIPSE 9.0 STRL (GLOVE) ×3 IMPLANT
GLOVE ORTHO TXT STRL SZ7.5 (GLOVE) ×3 IMPLANT
GLOVE SURG 8.5 LATEX PF (GLOVE) ×3 IMPLANT
GLOVE SURG SS PI 8.5 STRL IVOR (GLOVE) ×2
GLOVE SURG SS PI 8.5 STRL STRW (GLOVE) ×1 IMPLANT
GOWN STRL REUS W/ TWL LRG LVL3 (GOWN DISPOSABLE) ×1 IMPLANT
GOWN STRL REUS W/TWL 2XL LVL3 (GOWN DISPOSABLE) ×6 IMPLANT
GOWN STRL REUS W/TWL LRG LVL3 (GOWN DISPOSABLE) ×2
KIT BASIN OR (CUSTOM PROCEDURE TRAY) ×3 IMPLANT
KIT ROOM TURNOVER OR (KITS) ×3 IMPLANT
MANIFOLD NEPTUNE II (INSTRUMENTS) ×3 IMPLANT
NEEDLE 22X1 1/2 (OR ONLY) (NEEDLE) ×3 IMPLANT
PACK ARTHROSCOPY DSU (CUSTOM PROCEDURE TRAY) ×3 IMPLANT
PAD ARMBOARD 7.5X6 YLW CONV (MISCELLANEOUS) ×6 IMPLANT
SET ARTHROSCOPY TUBING (MISCELLANEOUS) ×2
SET ARTHROSCOPY TUBING LN (MISCELLANEOUS) ×1 IMPLANT
SPONGE LAP 4X18 X RAY DECT (DISPOSABLE) ×6 IMPLANT
SUT ETHILON 3 0 PS 1 (SUTURE) ×3 IMPLANT
SUT VIC AB 3-0 PS2 18 (SUTURE)
SUT VIC AB 3-0 PS2 18XBRD (SUTURE) IMPLANT
SYR 30ML LL (SYRINGE) ×3 IMPLANT
TOWEL OR 17X24 6PK STRL BLUE (TOWEL DISPOSABLE) ×3 IMPLANT
TUBE CONNECTING 12'X1/4 (SUCTIONS) ×1
TUBE CONNECTING 12X1/4 (SUCTIONS) ×2 IMPLANT
WAND HAND CNTRL MULTIVAC 90 (MISCELLANEOUS) IMPLANT
WATER STERILE IRR 1000ML POUR (IV SOLUTION) ×3 IMPLANT

## 2016-11-09 NOTE — H&P (Addendum)
PREOPERATIVE H&P  Chief Complaint:Right knee pain and swelling.  HPI: Dustin [name] is a 51 y.o. male who presents for preoperative history and physical with a diagnosis of septic  Right knee arthritis or pyarthrosis. Symptoms are rated as moderate to severe, and have been worsening.  This is significantly impairing activities of daily living.  He has elected for surgical management. 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  Post op he was doing well and was continuing therapy to improve his ROM. He presented yesterday to [facility] with worsening right knee swelling and underwent aspiration of about 50cc of seropurulent effusion. He however had full ROM,no fever, and no warm localizing to the right knee. The laboratory  Finding indicate an effusion with WBC of 177K with left shift 9% polys consistent with a septic right knee effusion. He is admitted to undergo emergent treatment of the right  Septic arthritis. He notes lack of apetite, has not eaten since last evening and has not drank. Concern about  Upcoming responsibilities he has neck   Past Medical History:  Diagnosis Date  . Arthritis    knee, back  . Chronic back pain   . Dyspnea    with exertion  . Hypertension   . Obesity   . PTSD (post-traumatic stress disorder)    Past Surgical History:  Procedure Laterality Date  . CERVICAL FUSION  2007   C 5, 6 ,7  . CLOSED REDUCTION WRIST FRACTURE Left 04/01/2015   Procedure: CLOSED REDUCTION WRIST AND CASTING REPAIR LACERATION TO FINGERS TIMES 2;  Surgeon: Dorna Leitz, MD;  Location: [facility];  Service: Orthopedics;  Laterality: Left;  . FEMUR IM NAIL Right 04/01/2015   Procedure: INTRAMEDULLARY (IM) RETROGRADE  FEMORAL NAILING ;  Surgeon: Dorna Leitz, MD;  Location: [facility];  Service: Orthopedics;  Laterality: Right;  . HARDWARE REMOVAL Right 10/09/2016   Procedure: Dynamization of Nail Right  femur, removal of Screws and Excision of hematoma cavity right thigh;  Surgeon: Jessy Oto, MD;  Location: [facility];  Service: Orthopedics;  Laterality: Right;  . HEMATOMA EVACUATION Right 10/09/2016   Procedure: EVACUATION HEMATOMA;  Surgeon: Jessy Oto, MD;  Location: [facility];  Service: Orthopedics;  Laterality: Right;  . RESECTION DISTAL CLAVICAL Right 2009  . SHOULDER SURGERY Right    Social History   Social History  . Marital status: Single    Spouse name: N/A  . Number of children: N/A  . Years of education: N/A   Social History Main Topics  . Smoking status: Never Smoker  . Smokeless tobacco: Never Used  . Alcohol use Yes     Comment: occasional   . Drug use: No  . Sexual activity: Not on file   Other Topics Concern  . Not on file   Social History Narrative   ** Merged History Encounter **       Family History  Problem Relation Age of Onset  . Colon cancer Neg Hx    Allergies  Allergen Reactions  . Latex Rash and Swelling   Prior to Admission medications   Medication Sig Start Date End Date Taking? Authorizing Provider  Ascorbic Acid (VITAMIN C PO) Take 1 tablet by mouth daily.    [provider]  aspirin EC 325 MG tablet Take 1 tablet (325 mg total) by mouth 2 (two) times daily after a meal. 10/10/16   Jessy Oto, MD  cyclobenzaprine (FLEXERIL) 10 MG tablet Take 10 mg by mouth 3 (three) times daily as needed for muscle spasms.    [provider]  cyclobenzaprine (FLEXERIL) 10 MG tablet Take 1 tablet (10 mg total) by mouth 3 (three) times daily as needed for muscle spasms. 10/10/16   Jessy Oto, MD  docusate sodium (COLACE) 100 MG capsule Take 1 capsule (100 mg total) by mouth 2 (two) times daily. 10/10/16   Jessy Oto, MD  lisinopril (PRINIVIL,ZESTRIL) 20  MG tablet Take 1 tablet (20 mg total) by mouth daily. 08/29/16   Shelda Pal, DO  Oxycodone HCl 10 MG TABS Take 10 mg by mouth every 8 (eight) hours as needed. for pain 07/25/16   [provider]  Oxycodone HCl 10 MG TABS Take 1 tablet (10 mg total) by mouth every 3 (three) hours as needed. 10/24/16   Jessy Oto, MD  oxymorphone (OPANA ER) 10 MG 12 hr tablet Take 1 tablet (10 mg total) by mouth every 8 (eight) hours. 10/24/16   Jessy Oto, MD     Positive ROS: All other systems have been reviewed and were otherwise negative with the exception of those mentioned in the HPI and as above.  Physical Exam: General: Alert, no acute distress Cardiovascular: No pedal edema Respiratory: No cyanosis, no use of accessory musculature GI: No organomegaly, abdomen is soft and non-tender Skin: No lesions in the area of chief complaint Neurologic: Sensation intact distally Psychiatric: Patient is competent for consent with normal mood and affect Lymphatic: No axillary or cervical lymphadenopathy  MUSCULOSKELETAL: Right knee examined with 2 plus effusion. Pulses are normal. Mild warmth. Position ballotment sign. ROM -15 degrees to 90degrees.    Assessment: Right knee septic arthritis, 3.5 weeks post removal of hardware right femur distal interlocking screw. #.5 weeks post resection of a chronic hematoma cavity right anterolateral   Plan: Plan for arthroscopic treatment of right knee septic arthritis with arthroscopic lavage and IV antibiotics.    The risks benefits and alternatives were discussed with the patient including but not limited to the risks of nonoperative treatment, versus surgical intervention including infection, bleeding, nerve injury,  blood clots, cardiopulmonary complications, morbidity, mortality, among others, and they were willing to proceed.   [name], MD Cell [phone] Office [phone] 11/09/2016 4:07 PM

## 2016-11-09 NOTE — Anesthesia Postprocedure Evaluation (Signed)
Anesthesia Post Note  Patient: Dustin [name]  Procedure(s) Performed: Procedure(s) (LRB): ARTHROSCOPIC IRRIGATION AND DEBRIDEMENT RIGHT KNEE (Right)     Patient location during evaluation: PACU Anesthesia Type: General Level of consciousness: awake, awake and alert and oriented Pain management: pain level controlled Vital Signs Assessment: post-procedure vital signs reviewed and stable Respiratory status: spontaneous breathing, nonlabored ventilation and respiratory function stable Cardiovascular status: blood pressure returned to baseline Anesthetic complications: no    Last Vitals:  Vitals:   11/09/16 2123 11/09/16 2140  BP:  (!) 156/101  Pulse:  96  Resp: 16 16  Temp: (!) 36.3 C   SpO2:  98%    Last Pain:  Vitals:   11/09/16 2140  TempSrc:   PainSc: 6                  Davidlee Jeanbaptiste COKER

## 2016-11-09 NOTE — Discharge Instructions (Addendum)
° ° °  Keep knee incisions and dressing dry for 4 days post op then may wet while bathing and apply bandaids. Call if fever or chills or increased drainage. Go to ER if acutely short of breath or call for ambulance. Return for follow up in 2 weeks. May full weight bear on the surgical leg unless told otherwise.Take full strength aspirin 325 mg with meal or snack twice a day to prevent blood clots. In house walking for first 2 weeks.

## 2016-11-09 NOTE — Transfer of Care (Signed)
Immediate Anesthesia Transfer of Care Note  Patient: Dustin [name]  Procedure(s) Performed: Procedure(s): ARTHROSCOPIC IRRIGATION AND DEBRIDEMENT RIGHT KNEE (Right)  Patient Location: PACU  Anesthesia Type:General  Level of Consciousness: awake, alert  and oriented  Airway & Oxygen Therapy: Patient Spontanous Breathing and Patient connected to nasal cannula oxygen  Post-op Assessment: Report given to RN and Post -op Vital signs reviewed and stable  Post vital signs: Reviewed and stable  Last Vitals:  Vitals:   11/09/16 1650 11/09/16 2039  BP: (!) 186/112 (!) 138/96  Pulse: (!) 103 99  Resp: 20 19  Temp: 36.9 C 36.7 C  SpO2: 100% 100%    Last Pain:  Vitals:   11/09/16 2039  TempSrc:   PainSc: 3       Patients Stated Pain Goal: 4 (96/43/83 8184)  Complications: No apparent anesthesia complications

## 2016-11-09 NOTE — Anesthesia Preprocedure Evaluation (Addendum)
Anesthesia Evaluation  Patient identified by MRN, date of birth, ID band Patient awake    Reviewed: Allergy & Precautions, NPO status , Patient's Chart, lab work & pertinent test results  Airway Mallampati: II  TM Distance: >3 FB Neck ROM: Full    Dental  (+) Teeth Intact, Dental Advisory Given   Pulmonary    breath sounds clear to auscultation       Cardiovascular hypertension,  Rhythm:Regular Rate:Normal     Neuro/Psych    GI/Hepatic   Endo/Other    Renal/GU      Musculoskeletal   Abdominal   Peds  Hematology   Anesthesia Other Findings   Reproductive/Obstetrics                            Anesthesia Physical Anesthesia Plan  ASA: II  Anesthesia Plan: General   Post-op Pain Management:    Induction: Intravenous  PONV Risk Score and Plan: Dexamethasone and Ondansetron  Airway Management Planned: LMA  Additional Equipment:   Intra-op Plan:   Post-operative Plan:   Informed Consent: I have reviewed the patients History and Physical, chart, labs and discussed the procedure including the risks, benefits and alternatives for the proposed anesthesia with the patient or authorized representative who has indicated his/her understanding and acceptance.   Dental advisory given  Plan Discussed with: CRNA and Anesthesiologist  Anesthesia Plan Comments:        Anesthesia Quick Evaluation

## 2016-11-09 NOTE — Telephone Encounter (Signed)
Patient informed Parking Placard App ready to be p/u during regular business hours, understood & agreed/SLS 09/21

## 2016-11-09 NOTE — Telephone Encounter (Signed)
I discussed with Dustin [name] the Rx that I had given to Dustin [name]. This Rx was given on 10/24/2016 to Dustin [name] and he apparently was then seen by his primary care MD, his primary care MD normally prescribes for chronic pain oxymorphone TID #90 monthly. This patient then obtained a prescription from his primary care MD for #90 and after Receiving the #90 presented on 11/03/2016 to the pharmacy with our Rx for this medication from 10/24/2016. The pharmacist did not prescribe and after discussion with Dustin [name] I have recommended that this Rx not be filled.  Unfortunately Dustin [name] appears to have a right knee infection. He is being admitted to [facility] today for treatment.

## 2016-11-09 NOTE — Anesthesia Procedure Notes (Signed)
Procedure Name: LMA Insertion Date/Time: 11/09/2016 7:31 PM Performed by: Manuela Schwartz B Pre-anesthesia Checklist: Patient identified, Emergency Drugs available, Suction available and Patient being monitored Patient Re-evaluated:Patient Re-evaluated prior to induction Oxygen Delivery Method: Circle System Utilized Preoxygenation: Pre-oxygenation with 100% oxygen Induction Type: IV induction Ventilation: Mask ventilation without difficulty LMA: LMA inserted LMA Size: 5.0 Number of attempts: 1 Airway Equipment and Method: Bite block Placement Confirmation: positive ETCO2 Tube secured with: Tape Dental Injury: Teeth and Oropharynx as per pre-operative assessment

## 2016-11-09 NOTE — Op Note (Signed)
11/09/2016  8:58 PM  PATIENT:  Dustin [name]  51 y.o. male  MRN: [id number]  OPERATIVE REPORT  PRE-OPERATIVE DIAGNOSIS:  pyarthrosis right knee  POST-OPERATIVE DIAGNOSIS:  pyarthrosis right knee  PROCEDURE:  Procedure(s): ARTHROSCOPIC IRRIGATION AND DEBRIDEMENT RIGHT KNEE    SURGEON:  Jessy Oto, MD     ANESTHESIA:  General, Dr. Linna Caprice.    COMPLICATIONS:  None.     DRAINS: 4.58m Hemovac drain to closed suction reservoir. EBL: 75CC  SPECIMEN: Purulent material and synovial fluid sent for C&S and gram stain, fungal and TB.   PROCEDURE: The patient was met in the holding area, and the appropriate right knee identified and marked with an "X" and my initials.  The patient was then transported to OR and was placed on the operative table in a supine position. The patient was then placed under  general anesthesia without difficulty. The right leg placed into a leg holder a tourniquet about the right upper thigh  Leg was then prepped using sterile conditions and draped using sterile technique.  Time-out procedure was called and correct. Landmarks right knee with a marked using a sterile lower marking pen. Infiltration of the knee joint instillation of 20 cc of irrigant solution plate we. 18 gauge spinal needle used to identify the superior portal site for the lateral suprapatella area. 11 blade scalpel a stab incision blunt spread into the knee joint using a 5.0 mm inflow cannula with its trocar. The OR arthroscopy sleeve also was then inserted into the knee via a medial inferior anterior portal site. Inflow at 80 mmHG. Pressure.There was significant purulent drainage with fronds of fibrofatty tissue. The inflow placed into the anterolateral suprapatella cannula and approximately 9 liters of normal saline irrigation was placed through the knee joint line. Both inflow and out flow cannulas were passed about the knee joint and the knee placed through ROM to milk any synovial fluid and to  irrigate the knee fully. 250cc of double antibiotic solution was then instilled into the knee joint and allowed to inflate the joint then drained several times. The knee then deflated of all irrigant solution and a 4.7 mm hemovac drain placed through the suprapatella. Following irrigation the joint was expressed of any serious solution and the inflow cannula removed. Each of the portals and closed with a single subcutaneous suture of 3-0 Vicryl in a vertical mattress suture of 3-0 nylon. The large Hemovac drain sewn in place, The hemovac then placed to closed suction container.  Dry dressing of Adaptic 4 x 4's ABDs pads fixed to the skin with sterile Webril. Ace wrap then applied. All instrument and sponge counts were correct. Patient was then reactivated extubated and returned to recovery room in satisfactory condition.    JBasil Dess 11/09/2016, 8:58 PM

## 2016-11-09 NOTE — Interval H&P Note (Signed)
History and Physical Interval Note:  11/09/2016 6:11 PM  Dustin [name]  has presented today for surgery, with the diagnosis of pyarthrosis right knee  The various methods of treatment have been discussed with the patient and family. After consideration of risks, benefits and other options for treatment, the patient has consented to  Procedure(s): ARTHROSCOPIC IRRIGATION AND DEBRIDEMENT RIGHT KNEE (Right) as a surgical intervention .  The patient's history has been reviewed, patient examined, no change in status, stable for surgery.  I have reviewed the patient's chart and labs.  Questions were answered to the patient's satisfaction.     [name]

## 2016-11-09 NOTE — Progress Notes (Signed)
Pharmacy Antibiotic Note  Dustin [name] is a 50 y.o. male admitted on 11/09/2016 with septic arthritis of R knee.  Pharmacy has been consulted for Vancomycin dosing.  Patient is s/p arthroscopic lavage / I&D and received Vancomycin 1gm IV x 1 intra-operatively ~ 2000 on 9/21.  Plan: Will give additional Vancomycin 1gm IV x 1 now to = 2gm loading dose.   Then start Vancomycin 1 gm IV every 12 hours.  Goal trough 15-20 mcg/mL.  Follow-up cx data, renal function, and clinical progress  Height: 6' 1.5" (186.7 cm) Weight: 238 lb 12.8 oz (108.3 kg) IBW/kg (Calculated) : 81.06  Temp (24hrs), Avg:98.1 F (36.7 C), Min:97.3 F (36.3 C), Max:98.6 F (37 C)   Recent Labs Lab 11/09/16 1624  WBC 9.2  CREATININE 0.80    Estimated Creatinine Clearance: 139 mL/min (by C-G formula based on SCr of 0.8 mg/dL).    Allergies  Allergen Reactions  . Latex Rash and Swelling    Antimicrobials this admission: Vanc 9/21 >>  Dose adjustments this admission:   Microbiology results: 9/21 synovial fluid R knee >> 9/21 MRSA PCR: positive  Thank you for allowing pharmacy to be a part of this patient's care.  [employer], Pharm.D., BCPS Clinical Pharmacist 11/09/2016 10:30 PM

## 2016-11-09 NOTE — Brief Op Note (Signed)
11/09/2016  8:38 PM  PATIENT:  Dustin [name]  51 y.o. male  PRE-OPERATIVE DIAGNOSIS:  pyarthrosis right knee  POST-OPERATIVE DIAGNOSIS:  pyarthrosis right knee  PROCEDURE:  Procedure(s): ARTHROSCOPIC IRRIGATION AND DEBRIDEMENT RIGHT KNEE (Right)  SURGEON:  Surgeon(s) and Role:    * Jessy Oto, MD - Primary  ANESTHESIA:   general, Dr. Linna Caprice  EBL:  Total I/O In: 1000 [I.V.:1000] Out: [facility] BLOOD ADMINISTERED:none  DRAINS: (large 4.51mm ) Hemovact drain(s) in the right anterolateral superior knee with  Suction Open   LOCAL MEDICATIONS USED:  NONE  SPECIMEN:  Source of Specimen:  Leftt knee purulent material with initial entry into the right knee joint.  DISPOSITION OF SPECIMEN:  Microbiology for gram stain, C&S, anaerobe and aerobe, fungus and TB.  COUNTS:  YES  TOURNIQUET:    DICTATION: .Dragon Dictation  PLAN OF CARE: Admit to inpatient   PATIENT DISPOSITION:  PACU - hemodynamically stable.   Delay start of Pharmacological VTE agent (>24hrs) due to surgical blood loss or risk of bleeding: no

## 2016-11-10 ENCOUNTER — Encounter (HOSPITAL_COMMUNITY): Payer: Self-pay | Admitting: Specialist

## 2016-11-10 DIAGNOSIS — M009 Pyogenic arthritis, unspecified: Principal | ICD-10-CM

## 2016-11-10 LAB — BASIC METABOLIC PANEL
ANION GAP: 6 (ref 5–15)
BUN: 10 mg/dL (ref 6–20)
CALCIUM: 8.6 mg/dL — AB (ref 8.9–10.3)
CO2: 26 mmol/L (ref 22–32)
CREATININE: 0.85 mg/dL (ref 0.61–1.24)
Chloride: 105 mmol/L (ref 101–111)
GFR calc Af Amer: >60 mL/min (ref >60)
GLUCOSE: 120 mg/dL — AB (ref 65–99)
Potassium: 3.6 mmol/L (ref 3.5–5.1)
Sodium: 137 mmol/L (ref 135–145)

## 2016-11-10 LAB — HIV ANTIBODY (ROUTINE TESTING W REFLEX): HIV Screen 4th Generation wRfx: NONREACTIVE

## 2016-11-10 MED ORDER — MORPHINE SULFATE (PF) 4 MG/ML IV SOLN
2.0000 mg | INTRAVENOUS | Status: DC | PRN
  Administered 2016-11-10 – 2016-11-17 (×40): 2 mg via INTRAVENOUS
  Filled 2016-11-10 (×42): qty 1

## 2016-11-10 MED ORDER — DEXTROSE 5 % IV SOLN
2.0000 g | INTRAVENOUS | Status: DC
  Administered 2016-11-10 – 2016-11-17 (×8): 2 g via INTRAVENOUS
  Filled 2016-11-10 (×9): qty 2

## 2016-11-10 MED ORDER — MORPHINE SULFATE ER 15 MG PO TBCR
30.0000 mg | EXTENDED_RELEASE_TABLET | Freq: Two times a day (BID) | ORAL | Status: DC
  Administered 2016-11-10 – 2016-11-12 (×5): 30 mg via ORAL
  Filled 2016-11-10 (×5): qty 2
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
## 2016-11-10 NOTE — Progress Notes (Addendum)
Patient ID: Dustin [name], male   DOB: Mar 09, 1965, 51 y.o.   MRN: [id number] Right knee septic arthritis. May weight bear as tolerated with a walker Hemovac is sewn in place and should remain until nearly No drainage per shift.  Vancomycin and ID consult with Dr. Linus Salmons placed.

## 2016-11-10 NOTE — Progress Notes (Signed)
Subjective: 1 Day Post-Op Procedure(s) (LRB): ARTHROSCOPIC IRRIGATION AND DEBRIDEMENT RIGHT KNEE (Right) Patient reports pain as moderate.  Appears more comfortable now than last evening.  Tolerated surgery well.  Awaiting culture results from right knee.  Drain with minimal output.  Objective: Vital signs in last 24 hours: Temp:  [97.3 F (36.3 C)-98.6 F (37 C)] 97.3 F (36.3 C) (09/22 0532) Pulse Rate:  [93-103] 93 (09/22 0532) Resp:  [16-20] 19 (09/22 0532) BP: (134-186)/(86-115) 134/86 (09/22 0532) SpO2:  [97 %-100 %] 97 % (09/22 0532) Weight:  [238 lb 12.8 oz (108.3 kg)] 238 lb 12.8 oz (108.3 kg) (09/21 1756)  Intake/Output from previous day: 09/21 0701 - 09/22 0700 In: 1300 [I.V.:1200] Out: 790 [Urine:700; Drains:80; Blood:10] Intake/Output this shift: No intake/output data recorded.   Recent Labs  11/09/16 1624  HGB 13.5    Recent Labs  11/09/16 1624  WBC 9.2  RBC 4.81  HCT 41.9  PLT 266    Recent Labs  11/09/16 1624 11/10/16 0510  NA 141 137  K 3.6 3.6  CL 108 105  CO2 26 26  BUN 7 10  CREATININE 0.80 0.85  GLUCOSE 102* 120*  CALCIUM 9.1 8.6*   No results for input(s): LABPT, INR in the last 72 hours.  Sensation intact distally Intact pulses distally Dorsiflexion/Plantar flexion intact Incision: dressing C/D/I Compartment soft  Assessment/Plan: 1 Day Post-Op Procedure(s) (LRB): ARTHROSCOPIC IRRIGATION AND DEBRIDEMENT RIGHT KNEE (Right) Continue IV antibiotics and drain. Follow culture results.  Mcarthur Rossetti 11/10/2016, 7:47 AM
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
## 2016-11-10 NOTE — Evaluation (Signed)
Occupational Therapy Evaluation and Discharge Patient Details Name: Dustin [name] MRN: [id number] DOB: 08-16-1965 Today's Date: 11/10/2016    History of Present Illness Dustin [name] is a 51 y.o. male with a history of a femur fracture in 2017 with an intramedullary fixation and a chronic right proximal thigh chronic hematoma that was evacuated with removal of screws on 8/21 and came back to the orthopedic office the day prior to admission with pain and swelling of the right knee and aspiration with 177K WBCs.  He went to the OR last night with Dr. Louanne Skye for a washout and cultures sent.    Clinical Impression   PTA Pt used Rollator for mobility and got occasional assistance from "lady friend" for bathing/dressing overall modified independent. PT is currently close to baseline and will have adequate support upon discharge to assist with ADL. OT education completed including safety & compensatory strategies for ADL and functional transfers, no questions or concerns at the end of session for OT. Education complete. OT to sign off at this time.    Follow Up Recommendations  No OT follow up;Supervision/Assistance - 24 hour (initially)    Equipment Recommendations  None recommended by OT (Pt has appropriate DME)    Recommendations for Other Services       Precautions / Restrictions Precautions Precautions: Other (comment);Fall Precaution Comments: hemovac and contact Restrictions Weight Bearing Restrictions: Yes RLE Weight Bearing: Weight bearing as tolerated      Mobility Bed Mobility Overal bed mobility: Needs Assistance Bed Mobility: Supine to Sit     Supine to sit: Min guard     General bed mobility comments: Pt sitting OOB when OT entered  Transfers Overall transfer level: Needs assistance Equipment used:  Agricultural consultant) Transfers: Sit to/from Stand Sit to Stand: Min guard         General transfer comment: cues for hand placement for improved safety    Balance Overall  balance assessment: Needs assistance Sitting-balance support: No upper extremity supported Sitting balance-[name]: Good     Standing balance support: Bilateral upper extremity supported Standing balance-[name]: Fair Standing balance comment: no UE during static standing, BUE support for dynamic movement                           ADL either performed or assessed with clinical judgement   ADL Overall ADL's : Needs assistance/impaired Eating/Feeding: Modified independent;Sitting   Grooming: Wash/dry hands;Wash/dry face;Oral care;Set up;Sitting (shaving) Grooming Details (indicate cue type and reason): in recliner Upper Body Bathing: Set up;Sitting Upper Body Bathing Details (indicate cue type and reason): in recliner Lower Body Bathing: Set up;Sitting/lateral leans Lower Body Bathing Details (indicate cue type and reason): in recliner Upper Body Dressing : Set up   Lower Body Dressing: Moderate assistance Lower Body Dressing Details (indicate cue type and reason): to don socks Toilet Transfer: Min guard;Ambulation;Comfort height toilet;Grab bars;RW   Toileting- Water quality scientist and Hygiene: Min guard;Sit to/from stand   Tub/ Shower Transfer: Min guard;Ambulation;Tub bench;Rolling walker   Functional mobility during ADLs: Min guard (rollator) General ADL Comments: Pt had just bathed himself when OT entered the room with set up from NT     Vision Baseline Vision/History: Wears glasses Wears Glasses: At all times Patient Visual Report: No change from baseline Vision Assessment?: No apparent visual deficits     Perception     Praxis      Pertinent Vitals/Pain Pain Assessment: 0-10 Pain Score: 4  Pain Location: R  leg Pain Descriptors / Indicators: Discomfort Pain Intervention(s): Monitored during session;Repositioned;Ice applied;Premedicated before session     Hand Dominance     Extremity/Trunk Assessment Upper Extremity Assessment Upper  Extremity Assessment: Overall WFL for tasks assessed   Lower Extremity Assessment Lower Extremity Assessment: RLE deficits/detail;Defer to PT evaluation RLE Deficits / Details: post-op deficits typical   Cervical / Trunk Assessment Cervical / Trunk Assessment: Normal   Communication Communication Communication: No difficulties   Cognition Arousal/Alertness: Awake/alert Behavior During Therapy: WFL for tasks assessed/performed Overall Cognitive Status: Within Functional Limits for tasks assessed                                     General Comments  Pt motivated to mobilize    Exercises Total Joint Exercises Ankle Circles/Pumps: AROM;Strengthening;Right;10 reps;Seated Long Arc Quad: AAROM;Strengthening;Right;10 reps;Seated Knee Flexion: AAROM;Strengthening;Right;10 reps;Seated   Shoulder Instructions      Home Living Family/patient expects to be discharged to:: Private residence Living Arrangements: Alone Available Help at Discharge: Personal care attendant Type of Home: Apartment Home Access: Level entry     Home Layout: Multi-level;Able to live on main level with bedroom/bathroom     Bathroom Shower/Tub: Tub/shower unit;Curtain   Bathroom Toilet: Standard     Home Equipment: Environmental consultant - 4 wheels;Bedside commode;Tub bench          Prior Functioning/Environment Level of Independence: Needs assistance  Gait / Transfers Assistance Needed: mod Indep with RW ADL's / Homemaking Assistance Needed: Pt reports that his "lady friend" assists him with dressing and bathing PTA and since his accident last year.             OT Problem List: Impaired balance (sitting and/or standing);Pain;Decreased range of motion;Decreased activity tolerance      OT Treatment/Interventions:      OT Goals(Current goals can be found in the care plan section) Acute Rehab OT Goals Patient Stated Goal: To return home and walk OT Goal Formulation: With patient Time For Goal  Achievement: 11/24/16 Potential to Achieve Goals: Good  OT Frequency:     Barriers to D/C:            Co-evaluation              AM-PAC PT "6 Clicks" Daily Activity     Outcome Measure Help from another person eating meals?: None Help from another person taking care of personal grooming?: A Little Help from another person toileting, which includes using toliet, bedpan, or urinal?: A Little Help from another person bathing (including washing, rinsing, drying)?: A Little Help from another person to put on and taking off regular upper body clothing?: None Help from another person to put on and taking off regular lower body clothing?: A Little 6 Click Score: 20   End of Session Equipment Utilized During Treatment: Gait belt;Rolling walker Nurse Communication: Mobility status;Other (comment) (Pt wants to talk to him about med regiment)  Activity Tolerance: Patient tolerated treatment well Patient left: in chair;with call bell/phone within reach  OT Visit Diagnosis: Other abnormalities of gait and mobility (R26.89);Pain Pain - Right/Left: Right Pain - part of body: Knee;Leg                Time: 1610-9604 OT Time Calculation (min): 24 min Charges:  OT General Charges $OT Visit: 1 Visit OT Evaluation $OT Eval Moderate Complexity: 1 Mod OT Treatments $Self Care/Home Management : 8-22 mins G-Codes:  Hulda Humphrey OTR/L [place] 11/10/2016, 2:43 PM

## 2016-11-10 NOTE — Progress Notes (Signed)
Spoke with Matt RN regarding PICC line to be placed possibly 11-11-16.

## 2016-11-10 NOTE — Care Management Note (Signed)
Case Management Note  Patient Details  Name: Dustin [name] MRN: [id number] Date of Birth: 09/28/1965  Subjective/Objective:   51 yr old gentleman admitted for I & D of septic right knee.                  Action/Plan: Case manager spoke with patient concerning discharge plan. Case manager offered choice for Home Health agency, patient says that he is already receiving therapy from [facility]. CM has left message with weekend Liaison to confirm. Patient has DME, and has a caretaker. Case Manager will continue to monitor.    Expected Discharge Date:   pending               Expected Discharge Plan:  [place]  In-House Referral:  NA  Discharge planning Services     Post Acute Care Choice:  Home Health Choice offered to:  Patient  DME Arranged:  Walker rolling with seat DME Agency:  NA  HH Arranged:  PT HH Agency:  [facility] (formerly [employer])  Status of Service:  In process, will continue to follow  If discussed at Long Length of Stay Meetings, dates discussed:    Additional Comments:  Ninfa Meeker, RN 11/10/2016, 9:51 AM

## 2016-11-10 NOTE — Progress Notes (Signed)
Patient upset and concerned about his pain regimen. He takes opana at home and is not getting it here. Pain regimen explained to patient. Was able to speak with Dr. Louanne Skye and his concern over his opana. I explained to patient we dont carry opana here and the ms contin 30mg  he will be getting is equivalent to the opana and is the substitution for it. After explaining patient was content and understanding. Soon afterwards patient called and was very frustrated. Stating we're not controlling his pain, we're not doing him right and he's in pain and he's going to rip everything off and go home. Dr. Ninfa Linden on call paged. Orders given to up IV morphine. Patient was still very unhappy with it and still threaten to leave. Explained to patient his MS contin 30mg  is scheduled at 10am but we may be able to take it sooner. Pharmacy called and med changed to an earlier time. Medication given and his pain regimen re explained again. Appeared to be much calmer. Will continue to monitor .

## 2016-11-11 MED ORDER — KETOROLAC TROMETHAMINE 30 MG/ML IJ SOLN
30.0000 mg | Freq: Once | INTRAMUSCULAR | Status: AC
  Administered 2016-11-11: 30 mg via INTRAVENOUS
  Filled 2016-11-11: qty 1

## 2016-11-11 MED ORDER — OXYCODONE HCL 5 MG PO TABS
5.0000 mg | ORAL_TABLET | Freq: Once | ORAL | Status: AC
  Administered 2016-11-11: 5 mg via ORAL
  Filled 2016-11-11: qty 1

## 2016-11-11 MED ORDER — OXYCODONE HCL 5 MG PO TABS
15.0000 mg | ORAL_TABLET | ORAL | Status: DC | PRN
  Administered 2016-11-11 – 2016-11-17 (×37): 15 mg via ORAL
  Filled 2016-11-11 (×37): qty 3

## 2016-11-11 NOTE — Progress Notes (Addendum)
Spoke with pt at bedside re PICC line placement for home ABT.  Pt adamantly refused, stating he "don't want any more hardware in my body".  Explained all risks and benefits, reasoning for PICC placement and alternatives at length.  Pt states "I'd rather stay here two weeks than have anything else in my body".  Please reorder PICC if pt becomes agreeable. Time spent at bedside 30 minutes.

## 2016-11-11 NOTE — Progress Notes (Signed)
Called Dr. Ninfa Linden around noon and notified him that the patient's pain still high and will only go down from 1-2 points, doesnt last long with the current pain regimen. Telephone order received for one time order of Toradol IV 30mg  but patient refused to take it at that time. He c/o severe pain this afternoon and offered him again the toradol because the other pain meds still not due. He accepted. At around 5pm, received a telephone order from Dr. Ninfa Linden regarding changes on the patient's pain regimen. See orders.

## 2016-11-11 NOTE — Progress Notes (Signed)
Patient ID: Dustin [name], male   DOB: 13-Nov-1965, 51 y.o.   MRN: [id number] Appreciate Dr. Henreitta Leber consult from ID.  PICC line ordered for long-term IV antibiotics.  The hemovac drain in the right knee is still putting out some fluid, so will leave it in today.  Otherwise no acute changes.

## 2016-11-11 NOTE — Progress Notes (Signed)
Notified Dr Linus Salmons of pt refusal of PICC placement.  PICC order cancelled.

## 2016-11-12 LAB — BASIC METABOLIC PANEL
ANION GAP: 8 (ref 5–15)
BUN: 11 mg/dL (ref 6–20)
CALCIUM: 8.9 mg/dL (ref 8.9–10.3)
CO2: 27 mmol/L (ref 22–32)
CREATININE: 0.8 mg/dL (ref 0.61–1.24)
Chloride: 102 mmol/L (ref 101–111)
GFR calc Af Amer: >60 mL/min (ref >60)
GLUCOSE: 97 mg/dL (ref 65–99)
POTASSIUM: 4.4 mmol/L (ref 3.5–5.1)
Sodium: 137 mmol/L (ref 135–145)

## 2016-11-12 LAB — VANCOMYCIN, TROUGH: Vancomycin Tr: 14 ug/mL — ABNORMAL LOW (ref 15–20)

## 2016-11-12 MED ORDER — MORPHINE SULFATE ER 15 MG PO TBCR
45.0000 mg | EXTENDED_RELEASE_TABLET | Freq: Two times a day (BID) | ORAL | Status: DC
  Administered 2016-11-12 – 2016-11-17 (×10): 45 mg via ORAL
  Filled 2016-11-12 (×10): qty 3

## 2016-11-12 MED ORDER — SODIUM CHLORIDE 0.9 % IV SOLN
1250.0000 mg | Freq: Two times a day (BID) | INTRAVENOUS | Status: DC
  Administered 2016-11-12 – 2016-11-17 (×10): 1250 mg via INTRAVENOUS
  Filled 2016-11-12 (×11): qty 1250

## 2016-11-12 NOTE — Progress Notes (Signed)
Pharmacy Antibiotic Note  Dustin [name] is a 51 y.o. male admitted on 11/09/2016 with septic arthritis of R knee.  Pharmacy has been consulted for Vancomycin dosing. Also on ceftriaxone per ID - Abx day #4. Afebrile, WBC wnl, SCr stable wnl.  Patient is s/p arthroscopic lavage / I&D 9/21 with drain placed. Plan is for long-term abx - noted, pt currently refusing PICC.  Plan: Continue vancomycin 1g IV Q12h Continue ceftriaxone 2g IV Q24h Monitor clinical picture, renal function, VT 9/24 F/U C&S, abx deescalation / LOT  Height: 6' 1.5" (186.7 cm) Weight: 238 lb 12.8 oz (108.3 kg) IBW/kg (Calculated) : 81.06  Temp (24hrs), Avg:98.3 F (36.8 C), Min:97.9 F (36.6 C), Max:98.9 F (37.2 C)   Recent Labs Lab 11/09/16 1624 11/10/16 0510 11/12/16 0451 11/12/16 2126  WBC 9.2  --   --   --   CREATININE 0.80 0.85 0.80  --   VANCOTROUGH  --   --   --  14*    Estimated Creatinine Clearance: 139 mL/min (by C-G formula based on SCr of 0.8 mg/dL).    Allergies  Allergen Reactions  . Latex Rash and Swelling    Antimicrobials this admission: Vanc 9/21 >> CTX 9/21 >>  Dose adjustments this admission:   Microbiology results: 9/21 tissue cx R knee: NGTD 9/21 synovial fluid R knee >> NGTD 9/21 MRSA PCR: positive  Elicia Lamp, PharmD, BCPS Clinical Pharmacist Rx Phone # for today: [phone] After 3:30PM, please call Main Rx: #[id number] 11/12/2016 10:39 PM   ADDENDUM Vancomycin trough came back at 14 at 2130 on 1000 mg every 12 hours. Last dose was given on 9/24 at 1100 - anticipate level being lower at true trough. Will increase dose slightly to 1250 mg every 12 hours starting tonight. Renal function remains stable.   Thanks, Doylene Canard, PharmD Clinical Pharmacist  Pager: [phone] Phone: [phone]

## 2016-11-12 NOTE — Progress Notes (Signed)
[facility] Infusion Coordinator will follow patient's hospital course with ID team to support IV ABX at DC as ordered.  [facility] will partner with [facility] who will provide [facility] services at DC.  If patient discharges after hours, please call [phone].   Dustin [name] 11/12/2016, 4:13 PM

## 2016-11-12 NOTE — Progress Notes (Signed)
Pharmacy Antibiotic Note  Dustin [name] is a 51 y.o. male admitted on 11/09/2016 with septic arthritis of R knee.  Pharmacy has been consulted for Vancomycin dosing. Also on ceftriaxone per ID - Abx day #4. Afebrile, WBC wnl, SCr stable wnl.  Patient is s/p arthroscopic lavage / I&D 9/21 with drain placed. Plan is for long-term abx - noted, pt currently refusing PICC.  Plan: Continue vancomycin 1g IV Q12h Continue ceftriaxone 2g IV Q24h Monitor clinical picture, renal function, VT 9/24 F/U C&S, abx deescalation / LOT  Height: 6' 1.5" (186.7 cm) Weight: 238 lb 12.8 oz (108.3 kg) IBW/kg (Calculated) : 81.06  Temp (24hrs), Avg:98.1 F (36.7 C), Min:98 F (36.7 C), Max:98.2 F (36.8 C)   Recent Labs Lab 11/09/16 1624 11/10/16 0510 11/12/16 0451  WBC 9.2  --   --   CREATININE 0.80 0.85 0.80    Estimated Creatinine Clearance: 139 mL/min (by C-G formula based on SCr of 0.8 mg/dL).    Allergies  Allergen Reactions  . Latex Rash and Swelling    Antimicrobials this admission: Vanc 9/21 >> CTX 9/21 >>  Dose adjustments this admission:   Microbiology results: 9/21 tissue cx R knee: NGTD 9/21 synovial fluid R knee >> NGTD 9/21 MRSA PCR: positive  Elicia Lamp, PharmD, BCPS Clinical Pharmacist Rx Phone # for today: [phone] After 3:30PM, please call Main Rx: #[id number] 11/12/2016 10:50 AM

## 2016-11-12 NOTE — Progress Notes (Addendum)
     Subjective: 3 Days Post-Op Procedure(s) (LRB): ARTHROSCOPIC IRRIGATION AND DEBRIDEMENT RIGHT KNEE (Right) Awake alert, sitting on the edge of the bed. Spending most of his time complaining of lack of pain meds. Voiding without difficulty. Unable to find out put from hemovac in I&Os.  Patient reports pain as moderate.    Objective:   VITALS:  Temp:  [98 F (36.7 C)-98.2 F (36.8 C)] 98.2 F (36.8 C) (09/24 0500) Pulse Rate:  [94-106] 106 (09/24 0500) Resp:  [20] 20 (09/24 0500) BP: (161-183)/(105-112) 183/112 (09/24 0500) SpO2:  [100 %] 100 % (09/24 0500)  Neurologically intact ABD soft Neurovascular intact Sensation intact distally Intact pulses distally Dorsiflexion/Plantar flexion intact Incision: moderate drainage   LABS  Recent Labs  11/09/16 1624  HGB 13.5  WBC 9.2  PLT 266    Recent Labs  11/10/16 0510 11/12/16 0451  NA 137 137  K 3.6 4.4  CL 105 102  CO2 26 27  BUN 10 11  CREATININE 0.85 0.80  GLUCOSE 120* 97   No results for input(s): LABPT, INR in the last 72 hours.   Assessment/Plan: 3 Days Post-Op Procedure(s) (LRB): ARTHROSCOPIC IRRIGATION AND DEBRIDEMENT RIGHT KNEE (Right)  Right knee sepic arthritis. Culture positive for MRSA. Poor systemic response.? Chronic pain syndrome . Chronic opiod use.  Advance diet Up with therapy Continue ABX therapy due to Post-op MRSA infection Will tritrate up on his narcotics. Plan to remove hemovac today after surgical cases.  [name] 11/12/2016, 7:48 AM Patient ID: Dustin [name], male   DOB: [birth year], 51 y.o.   MRN: [id number]

## 2016-11-12 NOTE — Progress Notes (Signed)
PT Cancellation Note  Patient Details Name: Dustin [name] MRN: [id number] DOB: February 16, 1966   Cancelled Treatment:    Reason Eval/Treat Not Completed: Pain limiting ability to participate (pt sitting EOB with flat affect reporting 9/10 right knee pain despite receiving all available pain meds. Pt made aware of role of P.T. but refuses any mobility until his pain meds and level have changed. Pt states he will try tomorrow)   [name] [name] 11/12/2016, 7:51 AM  [name], [place]

## 2016-11-13 ENCOUNTER — Encounter: Payer: Self-pay | Admitting: Family Medicine

## 2016-11-13 LAB — BASIC METABOLIC PANEL
ANION GAP: 10 (ref 5–15)
BUN: 8 mg/dL (ref 6–20)
CHLORIDE: 103 mmol/L (ref 101–111)
CO2: 22 mmol/L (ref 22–32)
Calcium: 8.8 mg/dL — ABNORMAL LOW (ref 8.9–10.3)
Creatinine, Ser: 0.82 mg/dL (ref 0.61–1.24)
GFR calc Af Amer: >60 mL/min (ref >60)
GLUCOSE: 78 mg/dL (ref 65–99)
POTASSIUM: 5 mmol/L (ref 3.5–5.1)
Sodium: 135 mmol/L (ref 135–145)

## 2016-11-13 NOTE — Progress Notes (Signed)
INFECTIOUS DISEASE PROGRESS NOTE  ID: Dustin [name] is a 51 y.o. male with  Principal Problem:   Septic arthritis of knee, right ([place]) Active Problems:   Traumatic seroma of thigh, right, sequela   Retained orthopedic hardware  Subjective: Normal BM, urine.  Denies allergies except latex.   Abtx:  Anti-infectives    Start     Dose/Rate Route Frequency Ordered Stop   11/12/16 2300  vancomycin (VANCOCIN) 1,250 mg in sodium chloride 0.9 % 250 mL IVPB     1,250 mg 166.7 mL/hr over 90 Minutes Intravenous Every 12 hours 11/12/16 2239     11/10/16 1230  cefTRIAXone (ROCEPHIN) 2 g in dextrose 5 % 50 mL IVPB     2 g 100 mL/hr over 30 Minutes Intravenous Every 24 hours 11/10/16 1147     11/10/16 1000  vancomycin (VANCOCIN) IVPB 1000 mg/200 mL premix  Status:  Discontinued     1,000 mg 200 mL/hr over 60 Minutes Intravenous Every 12 hours 11/09/16 2232 11/12/16 2239   11/09/16 2245  vancomycin (VANCOCIN) IVPB 1000 mg/200 mL premix     1,000 mg 200 mL/hr over 60 Minutes Intravenous NOW 11/09/16 2231 11/10/16 0029   11/09/16 2004  polymyxin B 500,000 Units, bacitracin 50,000 Units in sodium chloride 0.9 % 500 mL irrigation  Status:  Discontinued       As needed 11/09/16 2004 11/09/16 2038      Medications:  Scheduled: . aspirin EC  325 mg Oral BID PC  . Chlorhexidine Gluconate Cloth  6 each Topical Q0600  . docusate sodium  100 mg Oral BID  . lisinopril  20 mg Oral Daily  . morphine  45 mg Oral Q12H  . mupirocin ointment  1 application Nasal BID  . vitamin C  500 mg Oral Daily    Objective: Vital signs in last 24 hours: Temp:  [97.7 F (36.5 C)-98.9 F (37.2 C)] 97.7 F (36.5 C) (09/25 0606) Pulse Rate:  [98-102] 99 (09/25 0606) Resp:  [18] 18 (09/25 0606) BP: (153-161)/(92-108) 153/92 (09/25 0606) SpO2:  [97 %-100 %] 97 % (09/25 0606)   General appearance: alert and no distress Resp: clear to auscultation bilaterally Cardio: regular rate and rhythm GI: normal  findings: bowel sounds normal and soft, non-tender Extremities: R knee wrapped with drain.   Lab Results  Recent Labs  11/12/16 0451 11/13/16 0603  NA 137 135  K 4.4 5.0  CL 102 103  CO2 27 22  BUN 11 8  CREATININE 0.80 0.82   Liver Panel No results for input(s): PROT, ALBUMIN, AST, ALT, ALKPHOS, BILITOT, BILIDIR, IBILI in the last 72 hours. Sedimentation Rate No results for input(s): ESRSEDRATE in the last 72 hours. C-Reactive Protein No results for input(s): CRP in the last 72 hours.  Microbiology: Recent Results (from the past 240 hour(s))  Gram stain     Status: None   Collection Time: 11/08/16  4:40 PM  Result Value Ref Range Status   MICRO NUMBER: [id number]  Final   SPECIMEN QUALITY: ADEQUATE  Final   Source NOT GIVEN  Final   STATUS: FINAL  Final   GRAM STAIN:   Final    Many Polymorphonuclear leukocytes No epithelial cells seen No organisms seen  Anaerobic and Aerobic Culture     Status: None (Preliminary result)   Collection Time: 11/08/16  4:40 PM  Result Value Ref Range Status   MICRO NUMBER: [id number]  Preliminary   SPECIMEN QUALITY: ADEQUATE  Preliminary   Source:  RIGHT KNEE  Preliminary   STATUS: PRELIMINARY  Preliminary   GRAM STAIN:   Preliminary    Moderate Polymorphonuclear leukocytes No organisms seen   ANA RESULT:   Preliminary    No anaerobes isolated to date, continuing incubation.  Surgical pcr screen     Status: Abnormal   Collection Time: 11/09/16  5:03 PM  Result Value Ref Range Status   MRSA, PCR POSITIVE (A) NEGATIVE Final    Comment: RESULT CALLED TO, READ BACK BY AND VERIFIED WITH: M YIM RN 2024 11/09/16 A BROWNING    Staphylococcus aureus POSITIVE (A) NEGATIVE Final    Comment: (NOTE) The Xpert SA Assay (FDA approved for NASAL specimens in patients 87 years of age and older), is one component of a comprehensive surveillance program. It is not intended to diagnose infection nor to guide or monitor treatment.   Aerobic/Anaerobic  Culture (surgical/deep wound)     Status: None (Preliminary result)   Collection Time: 11/09/16  8:27 PM  Result Value Ref Range Status   Specimen Description SYNOVIAL RIGHT KNEE  Final   Special Requests FLUID ON SWAB  Final   Gram Stain   Final    ABUNDANT WBC PRESENT, PREDOMINANTLY PMN NO ORGANISMS SEEN    Culture   Final    NO GROWTH 4 DAYS NO ANAEROBES ISOLATED; CULTURE IN PROGRESS FOR 5 DAYS   Report Status PENDING  Incomplete  Aerobic/Anaerobic Culture (surgical/deep wound)     Status: None (Preliminary result)   Collection Time: 11/09/16  8:42 PM  Result Value Ref Range Status   Specimen Description TISSUE RIGHT KNEE  Final   Special Requests NONE  Final   Gram Stain   Final    ABUNDANT WBC PRESENT, PREDOMINANTLY PMN NO ORGANISMS SEEN    Culture   Final    NO GROWTH 4 DAYS NO ANAEROBES ISOLATED; CULTURE IN PROGRESS FOR 5 DAYS   Report Status PENDING  Incomplete    Studies/Results: No results found.   Assessment/Plan: R femur prosthesis R knee pyarthritis I & D 9-21  Total days of antibiotics: 4 (ceftriaxone/vanco)  Has does not have PIC and does not want PIC He is clear he wants oral antibiotics.  I also wants to make sure that we are treating this as agressively as possible- I let him know that typically that would mean IV antibiotics for 6 weeks.  Cx from 9-20 and 9-21 are no growth.  Would plan for 6 weeks of po anbx Would change him bactrim (1 DS po bid) at d/c Can f/u with PCP, ortho Available as needed.           Bobby Rumpf MD, FACP Infectious Diseases (pager) [phone] [email or web address] 11/13/2016, 11:29 AM  LOS: 4 days

## 2016-11-13 NOTE — Progress Notes (Signed)
   11/13/16 1100  Clinical Encounter Type  Visited With Patient  Visit Type Follow-up  Referral From Care management  Consult/Referral To Chaplain  Spiritual Encounters  Spiritual Needs Emotional  Stress Factors  Patient Stress Factors Health changes  Family Stress Factors None identified  During rounds it was suggested that pastor have a conversation with Dustin [name].  In speaking with the PT we touched on the subjects of familial relationships, his care in the hospital and outside of the hospital as well as his spirituality.  Two big words for him are trust and understanding.  He was raised by women and had a fatherly presence but it sounded like there were some issues of trust that manifest themselves.    What he thought was a routine and simple procedure turned out to be complicated for him with the infection  Dustin [name] is highly spiritual and very inquisitive in terms of wanting to know the meaning of as much as he can.  He articulates his faith and belief structures in his routine conversation.  Dustin [name] broke down as the stress of his health and issues outside of the hospital have caused his some pain points.

## 2016-11-13 NOTE — Progress Notes (Signed)
Patient ID: Dustin [name], male   DOB: Dec 21, 1965, 51 y.o.   MRN: [id number]     Subjective: 4 Days Post-Op Procedure(s) (LRB): ARTHROSCOPIC IRRIGATION AND DEBRIDEMENT RIGHT KNEE (Right) The right knee aches. No fever or chills. Feels better compared with at time of admission. Day #4 vancomycin, Day #3 rocephin. Does not want PICCU reports that with a previous left wrist infection PICCU line site became infected.   Patient reports pain as moderate.    Objective:   VITALS:  Temp:  [97.7 F (36.5 C)-98.9 F (37.2 C)] 97.7 F (36.5 C) (09/25 0606) Pulse Rate:  [98-102] 99 (09/25 0606) Resp:  [18] 18 (09/25 0606) BP: (153-161)/(92-108) 153/92 (09/25 0606) SpO2:  [97 %-100 %] 97 % (09/25 0606)  Neurologically intact ABD soft Neurovascular intact Sensation intact distally Intact pulses distally Dorsiflexion/Plantar flexion intact Incision: moderate drainage and Drainage is with decreasing purulence, amount of drainage is decreasing. No cellulitis present   LABS No results for input(s): HGB, WBC, PLT in the last 72 hours.  Recent Labs  11/12/16 0451 11/13/16 0603  NA 137 135  K 4.4 5.0  CL 102 103  CO2 27 22  BUN 11 8  CREATININE 0.80 0.82  GLUCOSE 97 78   No results for input(s): LABPT, INR in the last 72 hours.   Assessment/Plan: 4 Days Post-Op Procedure(s) (LRB): ARTHROSCOPIC IRRIGATION AND DEBRIDEMENT RIGHT KNEE (Right)  Septic arthritis right knee. Cultures from OR and Office are no growth but nasal swab is positive for MRSA. On broad spectrum antibiotics to cover for   Up with therapy Continue ABX therapy due to Post-op infection  Unwilling to have a PICCU line so that other options may need to be considered.   [name] 11/13/2016, 12:58 PM

## 2016-11-13 NOTE — Care Management Important Message (Signed)
Important Message  Patient Details  Name: Dustin [name] MRN: [id number] Date of Birth: 01-28-1966   Medicare Important Message Given:  Yes    Carles Collet, RN 11/13/2016, 9:57 AM

## 2016-11-13 NOTE — Progress Notes (Signed)
Physical Therapy Treatment Patient Details Name: Dustin [name] MRN: [id number] DOB: [birth year] Today's Date: 11/13/2016    History of Present Illness Dustin [name] is a 51 y.o. male with a history of a femur fracture in 2017 with an intramedullary fixation and a chronic right proximal thigh chronic hematoma that was evacuated with removal of screws on 8/21. Admitted with infection s/p I&D 9/21    PT Comments    Pt pleasant and agreeable to mobility today. Pt able to walk unit and perform limited HEP. Pt states ambulation was too much to also do HEP so may plan to start with HEP next session. Pt educated for ROM and HEP and encouraged to continue to  [facility].  Follow Up Recommendations  Home health PT     Equipment Recommendations  None recommended by PT    Recommendations for Other Services       Precautions / Restrictions Precautions Precautions: Fall Restrictions RLE Weight Bearing: Weight bearing as tolerated    Mobility  Bed Mobility               General bed mobility comments: EOB on arrival  Transfers Overall transfer level: Modified independent                  Ambulation/Gait Ambulation/Gait assistance: Supervision Ambulation Distance (Feet): 500 Feet Assistive device: 4-wheeled walker Gait Pattern/deviations: Step-through pattern;Decreased stride length;Trunk flexed   Gait velocity interpretation: Below normal speed for age/gender General Gait Details: pt maintains flexed trunk and shoulder elevation despite cues for posture and shoulder depression stating he has to walk that way due to pain   Stairs            Wheelchair Mobility    Modified Rankin (Stroke Patients Only)       Balance Overall balance assessment: No apparent balance deficits (not formally assessed)                                          Cognition Arousal/Alertness: Awake/alert Behavior During Therapy: WFL for tasks  assessed/performed Overall Cognitive Status: Within Functional Limits for tasks assessed                                        Exercises General Exercises - Lower Extremity Long Arc Quad: AROM;Right;Seated;5 reps Straight Leg Raises: AAROM;Right;Seated;5 reps Hip Flexion/Marching: AROM;Right;Seated;10 reps    General Comments        Pertinent Vitals/Pain Pain Score: 8  Pain Location: R leg Pain Descriptors / Indicators: Discomfort Pain Intervention(s): Limited activity within patient's tolerance;Repositioned;Premedicated before session;Monitored during session;Ice applied;Patient requesting pain meds-RN notified    Home Living                      Prior Function            PT Goals (current goals can now be found in the care plan section) Progress towards PT goals: Progressing toward goals    Frequency    Min 3X/week      PT Plan Discharge plan needs to be updated;Frequency needs to be updated    Co-evaluation              AM-PAC PT "6 Clicks" Daily Activity  Outcome Measure  Difficulty turning over in bed (including adjusting bedclothes,  sheets and blankets)?: None Difficulty moving from lying on back to sitting on the side of the bed? : None Difficulty sitting down on and standing up from a chair with arms (e.g., wheelchair, bedside commode, etc,.)?: A Little Help needed moving to and from a bed to chair (including a wheelchair)?: None Help needed walking in hospital room?: A Little Help needed climbing 3-5 steps with a railing? : A Little 6 Click Score: 21    End of Session   Activity Tolerance: Patient tolerated treatment well Patient left: in chair;with call bell/phone within reach;with nursing/sitter in room Nurse Communication: Mobility status PT Visit Diagnosis: Difficulty in walking, not elsewhere classified (R26.2);Pain Pain - Right/Left: Right Pain - part of body: Knee     Time: 1400-1421 PT Time Calculation  (min) (ACUTE ONLY): 21 min  Charges:  $Gait Training: 8-22 mins                    G Codes:       [name], PT [phone]    [place] 11/13/2016, 2:28 PM

## 2016-11-14 ENCOUNTER — Telehealth (INDEPENDENT_AMBULATORY_CARE_PROVIDER_SITE_OTHER): Payer: Self-pay | Admitting: Specialist

## 2016-11-14 ENCOUNTER — Encounter (INDEPENDENT_AMBULATORY_CARE_PROVIDER_SITE_OTHER): Payer: Self-pay | Admitting: Specialist

## 2016-11-14 LAB — BASIC METABOLIC PANEL
Anion gap: 11 (ref 5–15)
BUN: 11 mg/dL (ref 6–20)
CHLORIDE: 98 mmol/L — AB (ref 101–111)
CO2: 25 mmol/L (ref 22–32)
Calcium: 8.7 mg/dL — ABNORMAL LOW (ref 8.9–10.3)
Creatinine, Ser: 0.99 mg/dL (ref 0.61–1.24)
GFR calc Af Amer: >60 mL/min (ref >60)
GFR calc non Af Amer: >60 mL/min (ref >60)
GLUCOSE: 109 mg/dL — AB (ref 65–99)
POTASSIUM: 4 mmol/L (ref 3.5–5.1)
Sodium: 134 mmol/L — ABNORMAL LOW (ref 135–145)

## 2016-11-14 LAB — ANAEROBIC AND AEROBIC CULTURE
AER RESULT: NO GROWTH
MICRO NUMBER:: 81043885
MICRO NUMBER:: 81043886
SPECIMEN QUALITY:: ADEQUATE
SPECIMEN QUALITY:: ADEQUATE

## 2016-11-14 NOTE — Telephone Encounter (Signed)
Patient called asking about an email that he said was sent to you last night about his attorney. If you could give him a call as soon as possible. Thank you. CB # [phone]

## 2016-11-14 NOTE — Progress Notes (Signed)
     Subjective: 5 Days Post-Op Procedure(s) (LRB): ARTHROSCOPIC IRRIGATION AND DEBRIDEMENT RIGHT KNEE (Right) Alert, oriented x 4. Complaining of nursing not giving narcotics on schedule at night.  CGREE - MILD, MOD, SEV:[id number]}.    Objective:   VITALS:  Temp:  [98 F (36.7 C)-98.5 F (36.9 C)] 98 F (36.7 C) (09/25 2200) Pulse Rate:  [92-105] 92 (09/25 2200) Resp:  [16-17] 16 (09/25 2200) BP: (152-153)/(96-101) 153/96 (09/25 2200) SpO2:  [98 %-100 %] 100 % (09/25 2200)  Neurologically intact ABD soft Neurovascular intact Sensation intact distally Intact pulses distally Dorsiflexion/Plantar flexion intact Incision: scant drainage   LABS No results for input(s): HGB, WBC, PLT in the last 72 hours.  Recent Labs  11/13/16 0603 11/14/16 0531  NA 135 134*  K 5.0 4.0  CL 103 98*  CO2 22 25  BUN 8 11  CREATININE 0.82 0.99  GLUCOSE 78 109*   No results for input(s): LABPT, INR in the last 72 hours.   Assessment/Plan: 5 Days Post-Op Procedure(s) (LRB): ARTHROSCOPIC IRRIGATION AND DEBRIDEMENT RIGHT KNEE (Right)  Advance diet Up with therapy D/C IV fluids Continue ABX therapy due to Post-op infection Wants to go home Saturday, requests notes or letteer for his lawyer for court appearance.  Dustin [name] 11/14/2016, 8:59 AM Patient ID: Dustin [name], male   DOB: [birth year], 51 y.o.   MRN: [id number]

## 2016-11-14 NOTE — Telephone Encounter (Signed)
He might need to sign a records release form. TY.

## 2016-11-14 NOTE — Telephone Encounter (Signed)
Dr. Louanne Skye did a new letter and it has been faxed to his lawyer---JoAnne Herring @ [phone]

## 2016-11-14 NOTE — Progress Notes (Signed)
PT Cancellation Note  Patient Details Name: Dustin [name] MRN: [id number] DOB: Jan 09, 1966   Cancelled Treatment:    Reason Eval/Treat Not Completed: Pain limiting ability to participate reports unable to participate with therex or ambulation today.  States already walked in the room.  Attempted to encourage participation and got ice for pt, but he continued to refuse, and did not want ice due to leaks. Will attempt another day.   Reginia Naas 11/14/2016, 12:15 PM  Magda Kiel, [place] 11/14/2016

## 2016-11-15 ENCOUNTER — Encounter (HOSPITAL_COMMUNITY): Payer: Self-pay | Admitting: *Deleted

## 2016-11-15 LAB — BASIC METABOLIC PANEL
ANION GAP: 14 (ref 5–15)
BUN: 10 mg/dL (ref 6–20)
CALCIUM: 9 mg/dL (ref 8.9–10.3)
CHLORIDE: 102 mmol/L (ref 101–111)
CO2: 20 mmol/L — AB (ref 22–32)
CREATININE: 0.93 mg/dL (ref 0.61–1.24)
GFR calc Af Amer: >60 mL/min (ref >60)
GFR calc non Af Amer: >60 mL/min (ref >60)
GLUCOSE: 95 mg/dL (ref 65–99)
Potassium: 5 mmol/L (ref 3.5–5.1)
Sodium: 136 mmol/L (ref 135–145)

## 2016-11-15 LAB — C-REACTIVE PROTEIN: CRP: 8 mg/dL — ABNORMAL HIGH (ref <1.0)

## 2016-11-15 LAB — CBC WITH DIFFERENTIAL/PLATELET
Basophils Absolute: 0 10*3/uL (ref 0.0–0.1)
Basophils Relative: 0 %
EOS ABS: 0.2 10*3/uL (ref 0.0–0.7)
Eosinophils Relative: 4 %
HEMATOCRIT: 38.1 % — AB (ref 39.0–52.0)
HEMOGLOBIN: 11.7 g/dL — AB (ref 13.0–17.0)
LYMPHS ABS: 1 10*3/uL (ref 0.7–4.0)
LYMPHS PCT: 19 %
MCH: 26.9 pg (ref 26.0–34.0)
MCHC: 30.7 g/dL (ref 30.0–36.0)
MCV: 87.6 fL (ref 78.0–100.0)
MONOS PCT: 10 %
Monocytes Absolute: 0.6 10*3/uL (ref 0.1–1.0)
NEUTROS PCT: 67 %
Neutro Abs: 3.6 10*3/uL (ref 1.7–7.7)
Platelets: 265 10*3/uL (ref 150–400)
RBC: 4.35 MIL/uL (ref 4.22–5.81)
RDW: 14.1 % (ref 11.5–15.5)
WBC: 5.4 10*3/uL (ref 4.0–10.5)

## 2016-11-15 LAB — AEROBIC/ANAEROBIC CULTURE W GRAM STAIN (SURGICAL/DEEP WOUND): Culture: NO GROWTH

## 2016-11-15 LAB — AEROBIC/ANAEROBIC CULTURE (SURGICAL/DEEP WOUND): CULTURE: NO GROWTH

## 2016-11-15 MED ORDER — ASPIRIN 325 MG PO TBEC
325.0000 mg | DELAYED_RELEASE_TABLET | Freq: Two times a day (BID) | ORAL | 0 refills | Status: AC
Start: 2016-11-16 — End: ?

## 2016-11-15 MED ORDER — SULFAMETHOXAZOLE-TRIMETHOPRIM 800-160 MG PO TABS: ORAL_TABLET | ORAL | 0 refills | Status: DC

## 2016-11-15 MED ORDER — OXYCODONE HCL 15 MG PO TABS: 15.0000 mg | ORAL_TABLET | ORAL | 0 refills | Status: DC | PRN

## 2016-11-15 NOTE — Progress Notes (Signed)
Patient ID: Dustin [name], male   DOB: 09/02/1965, 51 y.o.   MRN: [id number] POD#6 I&D right knee septic arthritis, no organism on gram stain and cultures x 2. Peripheral WBC is improved post 6 days of IV antibiotics and patient has stated that he does not want a PICCU line. Dr.Hatcher has recommended po Bactrim DS BID for 6 weeks. He is seeing his pain management physician on Tuesday 10/2 and he is not sure how Much Opana he has at home. I explained that if he has enough to last until Tues 10/2 then no Rx for Opana is necessary as he is seeing his Pain management specialist on Tues who will prescribe more narcotics.  I will Rx for Bactrim DS #90 for 45 days of BID antibiotics PO. Also for Oxycodone for breakthrough pain. Opana Rx is yet to be determined based on Dustin [name] personal Assistant providing him with the # of current opana ER that he has In his possession at home.

## 2016-11-15 NOTE — Progress Notes (Signed)
Pharmacy Antibiotic Note  Dustin [name] is a 51 y.o. male admitted on 11/09/2016 with septic arthritis of R knee.  Pharmacy has been consulted for Vancomycin dosing. Also on ceftriaxone per ID - Abx day #7. Afebrile, WBC wnl, SCr stable wnl. Vancomycin dose was increased slightly on 9/24 for vancomycin trough of 14.  Patient is s/p arthroscopic lavage / I&D 9/21 with drain placed. Plan was for long-term IV abx but PICC refused - ID recommends to d/c on Bactrim 1 DS PO BID x 6wks at d/c.  Plan: Continue vancomycin at 1250mg  IV Q12h Ceftriaxone 2g IV Q24h Monitor renal function, VT prn - appears may d/c tomorrow on PO abx   Height: 6' 1.5" (186.7 cm) Weight: 238 lb 12.8 oz (108.3 kg) IBW/kg (Calculated) : 81.06  Temp (24hrs), Avg:98.3 F (36.8 C), Min:97.9 F (36.6 C), Max:98.7 F (37.1 C)   Recent Labs Lab 11/09/16 1624 11/10/16 0510 11/12/16 0451 11/12/16 2126 11/13/16 0603 11/14/16 0531 11/15/16 0545  WBC 9.2  --   --   --   --   --  5.4  CREATININE 0.80 0.85 0.80  --  0.82 0.99  --   VANCOTROUGH  --   --   --  14*  --   --   --     Estimated Creatinine Clearance: 112.3 mL/min (by C-G formula based on SCr of 0.99 mg/dL).    Allergies  Allergen Reactions  . Latex Rash and Swelling    Antimicrobials this admission: Vanc 9/21 >> CTX 9/21 >>  Dose adjustments this admission: 9/24 VT: 14 on 1g IV q12h, drawn 1.5 hrs early  Microbiology results: 9/21 tissue cx R knee: Neg 9/21 synovial fluid R knee >> Neg 9/21 MRSA PCR: positive  Elicia Lamp, PharmD, BCPS Clinical Pharmacist Rx Phone # for today: [phone] After 3:30PM, please call Main Rx: #[id number] 11/15/2016 10:53 AM

## 2016-11-16 NOTE — Progress Notes (Signed)
RN educated pt about wearing a gown and gloves when leaving the room due to MRSA precautions.

## 2016-11-16 NOTE — Progress Notes (Signed)
Subjective: Patient states that he is "frustrated" with staff regarding dispensing of pain meds and running of IV fluids.  still c/o of pain right knee.     Objective: Vital signs in last 24 hours: Temp:  [98.7 F (37.1 C)-98.8 F (37.1 C)] 98.8 F (37.1 C) (09/27 2154) Pulse Rate:  [107-130] 130 (09/27 2154) Resp:  [17] 17 (09/27 2154) BP: (155-179)/(93-109) 166/107 (09/27 2310) SpO2:  [95 %-100 %] 100 % (09/27 2154)  Intake/Output from previous day: 09/27 0701 - 09/28 0700 In: 2140 [P.O.:840; I.V.:1000; IV Piggyback:300] Out: 2200 [Urine:2200] Intake/Output this shift: No intake/output data recorded.   Recent Labs  11/15/16 0545  HGB 11.7*    Recent Labs  11/15/16 0545  WBC 5.4  RBC 4.35  HCT 38.1*  PLT 265    Recent Labs  11/14/16 0531 11/15/16 0545  NA 134* 136  K 4.0 5.0  CL 98* 102  CO2 25 20*  BUN 11 10  CREATININE 0.99 0.93  GLUCOSE 109* 95  CALCIUM 8.7* 9.0   No results for input(s): LABPT, INR in the last 72 hours.  Exam: Patient alert and oriented, NAD.  Appears comfortable during my visit. Right knee some swelling.  No drainage.  Sutures intact.  Calf nontender.    Assessment/Plan: Long discussion with patient. Advised that narcotic medications are PRN and not scheduled and I explained why.  Stated that he was not aware of this and appreciative of information.  Will plan for D/c home Saturday with PO antibiotics.  No narcotic scripts given by [place].  Patient is under pain management contract and has medication at home and scheduled to see Pain [facility] October 2nd.  F/u with Dr Louanne Skye as scheduled.     Benjiman Core 11/16/2016, 8:50 AM

## 2016-11-16 NOTE — Progress Notes (Signed)
Pt states his caretaker needs a note stating she was here yesterday. Charge RN states to print a generic letter for the pt.

## 2016-11-16 NOTE — Progress Notes (Signed)
Pt continues to DEMAND his pain medicine before it it due. Has been verbally aggressive all night. Spoke to Dr Louanne Skye.

## 2016-11-16 NOTE — Progress Notes (Signed)
PT Cancellation Note  Patient Details Name: Dustin [name] MRN: [id number] DOB: 08-Apr-1965   Cancelled Treatment:    Reason Eval/Treat Not Completed: Patient declined, no reason specified (pt stated pain 9/10 despite no available pain meds. Pt refused mobility or HEP and encouraged to continue ROM and gait)   Reymundo Winship B Christophere Hillhouse 11/16/2016, 12:14 PM  [name], [place]

## 2016-11-17 LAB — BASIC METABOLIC PANEL
Anion gap: 11 (ref 5–15)
BUN: 8 mg/dL (ref 6–20)
CHLORIDE: 103 mmol/L (ref 101–111)
CO2: 22 mmol/L (ref 22–32)
Calcium: 8.9 mg/dL (ref 8.9–10.3)
Creatinine, Ser: 0.98 mg/dL (ref 0.61–1.24)
Glucose, Bld: 105 mg/dL — ABNORMAL HIGH (ref 65–99)
POTASSIUM: 4.1 mmol/L (ref 3.5–5.1)
SODIUM: 136 mmol/L (ref 135–145)

## 2016-11-17 NOTE — Progress Notes (Signed)
LM with [name] [facility] to notify patient will DC today. Per notes, patient is active.

## 2016-11-17 NOTE — Progress Notes (Signed)
Patient has been running high BP's.  He has been refusing his daily Lisinopril.  Patient educated on the need to take BP meds.  Patient still refuses.

## 2016-11-17 NOTE — Progress Notes (Signed)
Removed IV, provided discharge education/instructions, all questions and concerns addressed, Pt not in distress, discharged home with all belongings, accompanied by friend.

## 2016-11-17 NOTE — Discharge Summary (Signed)
Physician Discharge Summary      Patient ID: Dustin [name] MRN: [id number] DOB/AGE: February 26, 1965 51 y.o.  Admit date: 11/09/2016 Discharge date: 11/17/2016  Admission Diagnoses:  Septic arthritis of knee, right [facility])  Discharge Diagnoses:  Principal Problem:   Septic arthritis of knee, right ([place]) Active Problems:   Traumatic seroma of thigh, right, sequela   Retained orthopedic hardware   Past Medical History:  Diagnosis Date  . Arthritis    "right knee, hips; lower back"  (11/09/2016)  . Chronic lower back pain   . Dyspnea    with exertion  . Hypertension   . Obesity   . PTSD (post-traumatic stress disorder)     Surgeries: Procedure(s): ARTHROSCOPIC IRRIGATION AND DEBRIDEMENT RIGHT KNEE on 11/09/2016   Consultants (if any):   Discharged Condition: Improved  Hospital Course: Dustin [name] is an 51 y.o. male who was admitted 11/09/2016 with a diagnosis of Septic arthritis of knee, right ([place]) and went to the operating room on 11/09/2016 and underwent the above named procedures.    He was given perioperative antibiotics:  Anti-infectives    Start     Dose/Rate Route Frequency Ordered Stop   11/15/16 0000  sulfamethoxazole-trimethoprim (BACTRIM DS,SEPTRA DS) 800-160 MG tablet  Status:  Discontinued        11/15/16 2051 11/15/16    11/15/16 0000  sulfamethoxazole-trimethoprim (BACTRIM DS,SEPTRA DS) 800-160 MG tablet        11/15/16 2053     11/12/16 2300  vancomycin (VANCOCIN) 1,250 mg in sodium chloride 0.9 % 250 mL IVPB     1,250 mg 166.7 mL/hr over 90 Minutes Intravenous Every 12 hours 11/12/16 2239     11/10/16 1230  cefTRIAXone (ROCEPHIN) 2 g in dextrose 5 % 50 mL IVPB     2 g 100 mL/hr over 30 Minutes Intravenous Every 24 hours 11/10/16 1147     11/10/16 1000  vancomycin (VANCOCIN) IVPB 1000 mg/200 mL premix  Status:  Discontinued     1,000 mg 200 mL/hr over 60 Minutes Intravenous Every 12 hours 11/09/16 2232 11/12/16 2239   11/09/16 2245  vancomycin (VANCOCIN)  IVPB 1000 mg/200 mL premix     1,000 mg 200 mL/hr over 60 Minutes Intravenous NOW 11/09/16 2231 11/10/16 0029   11/09/16 2004  polymyxin B 500,000 Units, bacitracin 50,000 Units in sodium chloride 0.9 % 500 mL irrigation  Status:  Discontinued       As needed 11/09/16 2004 11/09/16 2038    .  He was given sequential compression devices, early ambulation for DVT prophylaxis.  He benefited maximally from the hospital stay and there were no complications.    Recent vital signs:  Vitals:   11/16/16 1900 11/17/16 0544  BP: (!) 164/100 (!) 178/118  Pulse: 100 96  Resp: 18 20  Temp: 98 F (36.7 C)   SpO2: 99% 100%    Recent laboratory studies:  Lab Results  Component Value Date   HGB 11.7 (L) 11/15/2016   HGB 13.5 11/09/2016   HGB 12.1 (L) 10/09/2016   Lab Results  Component Value Date   WBC 5.4 11/15/2016   PLT 265 11/15/2016   Lab Results  Component Value Date   INR 0.93 10/04/2016   Lab Results  Component Value Date   NA 136 11/17/2016   K 4.1 11/17/2016   CL 103 11/17/2016   CO2 22 11/17/2016   BUN 8 11/17/2016   CREATININE 0.98 11/17/2016   GLUCOSE 105 (H) 11/17/2016    Discharge Medications:  Allergies as of 11/17/2016      Reactions   Latex Rash, Swelling      Medication List    TAKE these medications   aspirin EC 325 MG tablet Take 1 tablet (325 mg total) by mouth 2 (two) times daily after a meal. What changed:  Another medication with the same name was added. Make sure you understand how and when to take each.   aspirin 325 MG EC tablet Take 1 tablet (325 mg total) by mouth 2 (two) times daily after a meal. What changed:  You were already taking a medication with the same name, and this prescription was added. Make sure you understand how and when to take each.   cyclobenzaprine 10 MG tablet Commonly known as:  FLEXERIL Take 1 tablet (10 mg total) by mouth 3 (three) times daily as needed for muscle spasms.   docusate sodium 100 MG  capsule Commonly known as:  COLACE Take 1 capsule (100 mg total) by mouth 2 (two) times daily.   lisinopril 20 MG tablet Commonly known as:  PRINIVIL,ZESTRIL Take 1 tablet (20 mg total) by mouth daily.   Oxycodone HCl 10 MG Tabs Take 1 tablet (10 mg total) by mouth every 3 (three) hours as needed. What changed:  reasons to take this   oxyCODONE 15 MG immediate release tablet Commonly known as:  ROXICODONE Take 1 tablet (15 mg total) by mouth every 4 (four) hours as needed (breakthrough pain). What changed:  You were already taking a medication with the same name, and this prescription was added. Make sure you understand how and when to take each.   oxymorphone 10 MG 12 hr tablet Commonly known as:  OPANA ER Take 1 tablet (10 mg total) by mouth every 8 (eight) hours.   sulfamethoxazole-trimethoprim 800-160 MG tablet Commonly known as:  BACTRIM DS,SEPTRA DS Take one tablet po every 12 hours.   VITAMIN C PO Take 1 tablet by mouth daily.            Durable Medical Equipment        Start     Ordered   11/09/16 2152  DME Walker rolling  Once    Question:  Patient needs a walker to treat with the following condition  Answer:  Septic arthritis of knee, right ([place])   11/09/16 2151   11/09/16 2152  DME 3 n 1  Once     11/09/16 2151       Discharge Care Instructions        Start     Ordered   11/17/16 0000  Call MD / Call 911    Comments:  If you experience chest pain or shortness of breath, CALL 911 and be transported to the hospital emergency room.  If you develope a fever above 101.5 F, pus (white drainage) or increased drainage or redness at the wound, or calf pain, call your surgeon's office.   11/17/16 1416   11/17/16 0000  Constipation Prevention    Comments:  Drink plenty of fluids.  Prune juice may be helpful.  You may use a stool softener, such as Colace (over the counter) 100 mg twice a day.  Use MiraLax (over the counter) for constipation as needed.   11/17/16  1416   11/17/16 0000  Increase activity slowly as tolerated     11/17/16 1416   11/17/16 0000  Driving restrictions    Comments:  No driving while taking narcotic pain meds.   11/17/16 1416  11/16/16 0000  aspirin EC 325 MG EC tablet  2 times daily after meals     11/15/16 2051   11/15/16 0000  oxyCODONE (ROXICODONE) 15 MG immediate release tablet  Every 4 hours PRN     11/15/16 2051   11/15/16 0000  Call MD / Call 911    Comments:  If you experience chest pain or shortness of breath, CALL 911 and be transported to the hospital emergency room.  If you develope a fever above 101 F, pus (white drainage) or increased drainage or redness at the wound, or calf pain, call your surgeon's office.   11/15/16 2051   11/15/16 0000  Diet - low sodium heart healthy     11/15/16 2051   11/15/16 0000  Constipation Prevention    Comments:  Drink plenty of fluids.  Prune juice may be helpful.  You may use a stool softener, such as Colace (over the counter) 100 mg twice a day.  Use MiraLax (over the counter) for constipation as needed.   11/15/16 2051   11/15/16 0000  Increase activity slowly as tolerated     11/15/16 2051   11/15/16 0000  Discharge instructions    Comments:  Keep knee incisions and dressing dry for 4 days post op then may wet while bathing and apply bandaids. Call if fever or chills or increased drainage. Go to ER if acutely short of breath or call for ambulance. Return for follow up in 2 weeks. May full weight bear on the surgical leg unless told otherwise.Take full strength aspirin 325 mg with meal or snack twice a day to prevent blood clots. In house walking for first 2 weeks.   11/15/16 2051   11/15/16 0000  Driving restrictions    Comments:  No driving while taking narcotics of any kind.   11/15/16 2051   11/15/16 0000  Lifting restrictions    Comments:  No lifting for 6 weeks   11/15/16 2051   11/15/16 0000  sulfamethoxazole-trimethoprim (BACTRIM DS,SEPTRA DS) 800-160 MG  tablet     11/15/16 2053      Diagnostic Studies: Dg Chest Port 1 View  Result Date: 11/09/2016 CLINICAL DATA:  Right femur hardware removal. EXAM: DG C-ARM 61-120 MIN-NO REPORT; PORTABLE CHEST - 1 VIEW COMPARISON:  Chest x-ray 04/01/2015. FINDINGS: Mediastinum and hilar structures are normal. Lungs are clear. No pleural effusion or pneumothorax. Heart size normal. No acute bony abnormality . Prior cervical spine fusion . IMPRESSION: No acute cardiopulmonary disease. Electronically Signed   By: Marcello Moores  Register   On: 11/09/2016 16:40    Disposition: 01-Home or Self Care  Discharge Instructions    Call MD / Call 911    Complete by:  As directed    If you experience chest pain or shortness of breath, CALL 911 and be transported to the hospital emergency room.  If you develope a fever above 101 F, pus (white drainage) or increased drainage or redness at the wound, or calf pain, call your surgeon's office.   Call MD / Call 911    Complete by:  As directed    If you experience chest pain or shortness of breath, CALL 911 and be transported to the hospital emergency room.  If you develope a fever above 101.5 F, pus (white drainage) or increased drainage or redness at the wound, or calf pain, call your surgeon's office.   Constipation Prevention    Complete by:  As directed    Drink plenty of fluids.  Prune juice may be helpful.  You may use a stool softener, such as Colace (over the counter) 100 mg twice a day.  Use MiraLax (over the counter) for constipation as needed.   Constipation Prevention    Complete by:  As directed    Drink plenty of fluids.  Prune juice may be helpful.  You may use a stool softener, such as Colace (over the counter) 100 mg twice a day.  Use MiraLax (over the counter) for constipation as needed.   Diet - low sodium heart healthy    Complete by:  As directed    Discharge instructions    Complete by:  As directed    Keep knee incisions and dressing dry for 4 days post op  then may wet while bathing and apply bandaids. Call if fever or chills or increased drainage. Go to ER if acutely short of breath or call for ambulance. Return for follow up in 2 weeks. May full weight bear on the surgical leg unless told otherwise.Take full strength aspirin 325 mg with meal or snack twice a day to prevent blood clots. In house walking for first 2 weeks.   Driving restrictions    Complete by:  As directed    No driving while taking narcotics of any kind.   Driving restrictions    Complete by:  As directed    No driving while taking narcotic pain meds.   Increase activity slowly as tolerated    Complete by:  As directed    Increase activity slowly as tolerated    Complete by:  As directed    Lifting restrictions    Complete by:  As directed    No lifting for 6 weeks      Follow-up Information    Jessy Oto, MD Follow up on 11/27/2016.   Specialty:  Orthopedic Surgery Why:  For wound re-check Contact information: [address] [place] [address] [phone]        [facility] Follow up.   Specialty:  [facility] Contact information: [address] [place] [address] [phone]            Signed: Eduard Roux 11/17/2016, 2:17 PM

## 2016-11-20 ENCOUNTER — Telehealth (INDEPENDENT_AMBULATORY_CARE_PROVIDER_SITE_OTHER): Payer: Self-pay

## 2016-11-20 NOTE — Telephone Encounter (Signed)
Mary with [facility] would like clarification on therapy for patient and when will therapy resume for patient.  Patient is currently declining. CB#  [phone].  Please advise

## 2016-11-20 NOTE — Telephone Encounter (Signed)
Dustin [name] with [facility] would like clarification on therapy for patient and when will therapy resume for patient.  Patient is currently declining. CB#  [phone].  Please advise.  Thank You.

## 2016-11-21 ENCOUNTER — Telehealth: Payer: Self-pay | Admitting: *Deleted

## 2016-11-21 NOTE — Telephone Encounter (Signed)
Received Physician Orders from [place]; forwarded to provider/SLS 10/03

## 2016-11-22 ENCOUNTER — Other Ambulatory Visit (INDEPENDENT_AMBULATORY_CARE_PROVIDER_SITE_OTHER): Payer: Self-pay | Admitting: Specialist

## 2016-11-22 NOTE — Telephone Encounter (Signed)
This patient is followed by a pain clinic. He is supposed to get his narcotics through that office. He was also supposed to have seen them I believe 2 days ago. Please call him first thing in the morning because I do not plan on giving him any narcotics. Thanks.

## 2016-11-22 NOTE — Telephone Encounter (Signed)
Patient came in wanted you to fax a paper to an agency-same on you faxed before.He also needs pain medication oxycodone  15 mg. Please call him soon as you can per patient request.

## 2016-11-22 NOTE — Telephone Encounter (Signed)
Can you please advise on this rx for me? Thanks

## 2016-11-23 ENCOUNTER — Ambulatory Visit (INDEPENDENT_AMBULATORY_CARE_PROVIDER_SITE_OTHER): Payer: Medicare HMO | Admitting: Surgery

## 2016-11-23 ENCOUNTER — Telehealth (INDEPENDENT_AMBULATORY_CARE_PROVIDER_SITE_OTHER): Payer: Self-pay | Admitting: Specialist

## 2016-11-23 ENCOUNTER — Encounter (INDEPENDENT_AMBULATORY_CARE_PROVIDER_SITE_OTHER): Payer: Self-pay | Admitting: Surgery

## 2016-11-23 DIAGNOSIS — Z9889 Other specified postprocedural states: Secondary | ICD-10-CM

## 2016-11-23 NOTE — Progress Notes (Signed)
Post-Op Visit Note   Patient: Dustin [name]           Date of Birth: 01/08/1966           MRN: [id number] Visit Date: 11/23/2016 PCP: Shelda Pal, DO   Assessment & Plan:  Chief Complaint:  Chief Complaint  Patient presents with  . Left Knee - Follow-up  Patient returns. About 2 weeks status post right knee arthroscopy. States that he is having a lot of pain in his knee. Patient is also followed by Sherilyn Cooter PA-C with [facility] pain management. By discharge patient from the hospital September 28 he advised me that he had an appointment scheduled to see his pain management specialist October 2. I had long discussion with him that day and told him that it was very important for him to keep that appointment. Today patient states that he is not seen [facility] in about 3 months and did not have an appointment with him scheduled as  mentioned. States that he spoke with his primary care physician Dr. Riki Sheer referred him to another pain management office here in [place]. I did contact [facility]'s office and spoke with his assistant and I was advised the patient is still established with their office and had missed an appointment with them September 24.   Visit Diagnoses:  1. Status post arthroscopy of right knee      Plan: Had long discussion with patient regarding pain management. I did call Sherilyn Cooter PA-Cs pain clinic office and spoke with Lenna Sciara his.  Otherwise the patient did not have an appointment scheduled October 2. assistant. She did make an appointment for [name] to come in Monday, October 8 at 9 AM. I did advise him that it is important for him to keep this appointment. Recommend that he hold off on trying to get in with another pain clinic at this time especially since he is still in established patient with [facility]'s office. Follow-up with [place] in 1 week for recheck of his knee. We may get right knee x-ray at that visit.  Follow-Up  Instructions: Return in about 1 week (around 11/30/2016) for dr Louanne Skye.   Orders:  No orders of the defined types were placed in this encounter.  No orders of the defined types were placed in this encounter.   Imaging: No results found.  PMFS History: Patient Active Problem List   Diagnosis Date Noted  . Septic arthritis of knee, right ([place]) 11/09/2016    Class: Acute  . Effusion, right knee 11/08/2016  . 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 Retained orthopedic hardware 09/11/2016    Class: Chronic  . Thigh hematoma, right, sequela 01/23/2016  . Essential hypertension 12/15/2015  . Distal radius fracture, left 04/02/2015  . Closed fracture of right femur ([place]) 04/01/2015   Past Medical History:  Diagnosis Date  . Arthritis    "right knee, hips; lower back"  (11/09/2016)  . Chronic lower back pain   . Dyspnea    with exertion  . Hypertension   . Obesity   . PTSD (post-traumatic stress disorder)     Family History  Problem Relation Age of Onset  . Colon cancer Neg Hx     Past Surgical History:  Procedure Laterality Date  . ANTERIOR CERVICAL DECOMP/DISCECTOMY FUSION  2007   C 5, 6 ,7  . BACK SURGERY    . CLOSED REDUCTION WRIST FRACTURE Left 04/01/2015   Procedure: CLOSED  REDUCTION WRIST AND CASTING REPAIR LACERATION TO FINGERS TIMES 2;  Surgeon: Dorna Leitz, MD;  Location: [place];  Service: Orthopedics;  Laterality: Left;  . FEMUR IM NAIL Right 04/01/2015   Procedure: INTRAMEDULLARY (IM) RETROGRADE FEMORAL NAILING ;  Surgeon: Dorna Leitz, MD;  Location: [place];  Service: Orthopedics;  Laterality: Right;  . FRACTURE SURGERY    . HARDWARE REMOVAL Right 10/09/2016   Procedure: Dynamization of Nail Right  femur, removal of Screws and Excision of hematoma cavity right thigh;  Surgeon: Jessy Oto, MD;  Location: [place];  Service: Orthopedics;  Laterality: Right;  . HEMATOMA EVACUATION Right 10/09/2016   Procedure: EVACUATION HEMATOMA;   Surgeon: Jessy Oto, MD;  Location: [place];  Service: Orthopedics;  Laterality: Right;  . KNEE ARTHROSCOPY Right 11/09/2016   I&D  . KNEE ARTHROSCOPY Right 11/09/2016   Procedure: ARTHROSCOPIC IRRIGATION AND DEBRIDEMENT RIGHT KNEE;  Surgeon: Jessy Oto, MD;  Location: [facility];  Service: Orthopedics;  Laterality: Right;  . KNEE ARTHROSCOPY W/ ACL RECONSTRUCTION Right 2001  . LABRAL REPAIR Right 06/2005  . RESECTION DISTAL CLAVICAL Right 2009   Social History   Occupational History  . Not on file.   Social History Main Topics  . Smoking status: Never Smoker  . Smokeless tobacco: Never Used  . Alcohol use Yes     Comment: 11/09/2016 "might have 1 shot/year; if that"  . Drug use: No  . Sexual activity: Not Currently   Exam Right knee range of motion about 0-95. Does have some swelling. No gross signs of infection. No drainage. Today portal sutures removed and Steri-Strips applied. Calf nontender.

## 2016-11-23 NOTE — Telephone Encounter (Signed)
Dustin [name] from [facility] called asking for verbal approval on a new start date for nursing. CB # [phone]

## 2016-11-23 NOTE — Telephone Encounter (Signed)
Jeneen Rinks saw patient in office today for an appt

## 2016-11-26 ENCOUNTER — Telehealth (INDEPENDENT_AMBULATORY_CARE_PROVIDER_SITE_OTHER): Payer: Self-pay | Admitting: Radiology

## 2016-11-26 ENCOUNTER — Telehealth (INDEPENDENT_AMBULATORY_CARE_PROVIDER_SITE_OTHER): Payer: Self-pay

## 2016-11-26 NOTE — Telephone Encounter (Signed)
At this point we will stop providing any narcotic medications, there are too many instances of information not being  correct and an obvious chronic pain condition that we did not enter in to a contract to provide chronic medication.He is  out from surgery enough that he can receive chronic medications through pain management.

## 2016-11-26 NOTE — Telephone Encounter (Signed)
I spoke with Dr. Louanne Skye and he stated that narcotics need to come from his pain management clinic. I had long discussion with patient before his discharge from the hospital as well as in the office last week. I called his pain clinic myself and scheduled his appointment for this morning. I told him at that time that he needed to be there as scheduled. Patient is status post knee arthroscopy.  He should call them back and reschedule

## 2016-11-26 NOTE — Telephone Encounter (Signed)
Patient called, said the transportation service dropped him off at the wrong place and he had to walk a bit to get to the pain management appt.  He was 10 minutes late and they said they could not see him.  He has no pain meds and is in a lot of pain.  Please advise?

## 2016-11-26 NOTE — Telephone Encounter (Signed)
FYI to you only-  Patient called back and I advised the below, he says pain management resched him to Wednesday for another appt.  I advised him to make sure to keep that appt, and I would plan to get there early.  He understands, says he is going to the hospital ED anyway because he is in so much pain right now, he can't take the pain.

## 2016-11-26 NOTE — Telephone Encounter (Signed)
I called and advised Dustin [name] that they could start PT and Nursing per Benjiman Core, PA-C

## 2016-11-26 NOTE — Telephone Encounter (Signed)
I tried to call and it rang twice then silence, will try to call patient again to advise the below.

## 2016-11-26 NOTE — Telephone Encounter (Signed)
Levada Dy with [facility] would like to request a new verbal order for a start date of care on 11/27/16 for patient.  Cb# is [phone].  Please advise.  Thank you

## 2016-11-27 ENCOUNTER — Ambulatory Visit (INDEPENDENT_AMBULATORY_CARE_PROVIDER_SITE_OTHER): Payer: Medicare HMO | Admitting: Specialist

## 2016-11-27 ENCOUNTER — Encounter (INDEPENDENT_AMBULATORY_CARE_PROVIDER_SITE_OTHER): Payer: Self-pay | Admitting: Specialist

## 2016-11-27 VITALS — BP 167/102 | HR 95 | Ht 73.5 in | Wt 252.0 lb

## 2016-11-27 DIAGNOSIS — M00861 Arthritis due to other bacteria, right knee: Secondary | ICD-10-CM

## 2016-11-27 DIAGNOSIS — G894 Chronic pain syndrome: Secondary | ICD-10-CM

## 2016-11-27 MED ORDER — OXYCODONE HCL 15 MG PO TABS: 15.0000 mg | ORAL_TABLET | Freq: Three times a day (TID) | ORAL | 0 refills | Status: DC | PRN

## 2016-11-27 MED ORDER — OXYMORPHONE HCL ER 10 MG PO TB12: 10.0000 mg | ORAL_TABLET | Freq: Three times a day (TID) | ORAL | 0 refills | Status: AC

## 2016-11-27 NOTE — Progress Notes (Signed)
Post-Op Visit Note   Patient: Dustin [name]           Date of Birth: 09-07-1965           MRN: [id number] Visit Date: 11/27/2016 PCP: Shelda Pal, DO   Assessment & Plan: 3 weeks post arthroscopic I&D of right knee septic arthritis.  Chief Complaint:  Chief Complaint  Patient presents with  . Right Leg - Routine Post Op, Wound Check   Visit Diagnoses:  1. 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 Chronic pain syndrome   Right knee swelling, no warmth, minimal effusion much improved c/w pre I&D, He did not want a PICCU line so he is on Bactrim DS BID. Has adequate supply of meds. Portals are healing without drainage, erythrema or purulence.  Plan: Knee is suffering from osteoarthritis, only real proven treatments are Weight loss, NSIADs like  and exercise. Well padded shoes help. Ice the knee 2-3 times a day 15-20 mins at a time. I called Dr. Belenda Cruise [name]'s office [phone], his secretary discussed Dustin [name] situation and Dr. Josefa [name] approved our prescribing a one month supply of narcotics as he has not provided. Take Aspirin 325 BID. Follow-Up Instructions: Return in about 2 weeks (around 12/11/2016).   Orders:  No orders of the defined types were placed in this encounter.  Meds ordered this encounter  Medications  . oxymorphone (OPANA ER) 10 MG 12 hr tablet    Sig: Take 1 tablet (10 mg total) by mouth every 8 (eight) hours.    Dispense:  90 tablet    Refill:  0  . oxyCODONE (ROXICODONE) 15 MG immediate release tablet    Sig: Take 1 tablet (15 mg total) by mouth every 8 (eight) hours as needed (breakthrough pain).    Dispense:  90 tablet    Refill:  0    Imaging: No results found.  PMFS History: Patient Active Problem List   Diagnosis Date Noted  . Chronic pain syndrome 11/27/2016    Priority: High    Class: Chronic  . Septic arthritis of knee, right ([place]) 11/09/2016    Priority: High    Class: Acute  . Traumatic seroma of  thigh, right, sequela 09/11/2016    Priority: Low    Class: Chronic  . Retained orthopedic hardware 09/11/2016    Priority: Low    Class: Chronic  . Effusion, right knee 11/08/2016  . Thigh hematoma, right, sequela 01/23/2016  . Essential hypertension 12/15/2015  . Distal radius fracture, left 04/02/2015  . Closed fracture of right femur ([place]) 04/01/2015   Past Medical History:  Diagnosis Date  . Arthritis    "right knee, hips; lower back"  (11/09/2016)  . Chronic lower back pain   . Dyspnea    with exertion  . Hypertension   . Obesity   . PTSD (post-traumatic stress disorder)     Family History  Problem Relation Age of Onset  . Colon cancer Neg Hx     Past Surgical History:  Procedure Laterality Date  . ANTERIOR CERVICAL DECOMP/DISCECTOMY FUSION  2007   C 5, 6 ,7  . BACK SURGERY    . CLOSED REDUCTION WRIST FRACTURE Left 04/01/2015   Procedure: CLOSED REDUCTION WRIST AND CASTING REPAIR LACERATION TO FINGERS TIMES 2;  Surgeon: Dorna Leitz, MD;  Location: [place];  Service: Orthopedics;  Laterality: Left;  . FEMUR IM NAIL Right 04/01/2015   Procedure: INTRAMEDULLARY (IM) RETROGRADE FEMORAL NAILING ;  Surgeon:  Dorna Leitz, MD;  Location: [facility];  Service: Orthopedics;  Laterality: Right;  . FRACTURE SURGERY    . HARDWARE REMOVAL Right 10/09/2016   Procedure: Dynamization of Nail Right  femur, removal of Screws and Excision of hematoma cavity right thigh;  Surgeon: Jessy Oto, MD;  Location: [place];  Service: Orthopedics;  Laterality: Right;  . HEMATOMA EVACUATION Right 10/09/2016   Procedure: EVACUATION HEMATOMA;  Surgeon: Jessy Oto, MD;  Location: [place];  Service: Orthopedics;  Laterality: Right;  . KNEE ARTHROSCOPY Right 11/09/2016   I&D  . KNEE ARTHROSCOPY Right 11/09/2016   Procedure: ARTHROSCOPIC IRRIGATION AND DEBRIDEMENT RIGHT KNEE;  Surgeon: Jessy Oto, MD;  Location: [facility];  Service: Orthopedics;  Laterality: Right;  . KNEE ARTHROSCOPY W/ ACL RECONSTRUCTION Right  2001  . LABRAL REPAIR Right 06/2005  . RESECTION DISTAL CLAVICAL Right 2009   Social History   Occupational History  . Not on file.   Social History Main Topics  . Smoking status: Never Smoker  . Smokeless tobacco: Never Used  . Alcohol use Yes     Comment: 11/09/2016 "might have 1 shot/year; if that"  . Drug use: No  . Sexual activity: Not Currently

## 2016-11-27 NOTE — Patient Instructions (Addendum)
  Knee is suffering from osteoarthritis, only real proven treatments are Weight loss, NSIADs like  and exercise. Well padded shoes help. Ice the knee 2-3 times a day 15-20 mins at a time. I called Dr. Belenda Cruise Chrisp's office [phone], his secretary discussed Mr. Cogdell situation and Dr. Josefa Half approved our prescribing a one month supply of narcotics as he has not provided. Take Aspirin 325 BID.

## 2016-11-27 NOTE — Telephone Encounter (Signed)
Patient was been seen in office this morning

## 2016-11-27 NOTE — Telephone Encounter (Signed)
He does not therapy, so please cancel visits. jen

## 2016-11-28 ENCOUNTER — Ambulatory Visit (INDEPENDENT_AMBULATORY_CARE_PROVIDER_SITE_OTHER): Payer: Medicare HMO | Admitting: Family Medicine

## 2016-11-28 ENCOUNTER — Encounter: Payer: Self-pay | Admitting: Family Medicine

## 2016-11-28 VITALS — BP 152/110 | HR 95 | Temp 98.1°F | Ht 73.5 in | Wt 238.0 lb

## 2016-11-28 DIAGNOSIS — I10 Essential (primary) hypertension: Secondary | ICD-10-CM | POA: Diagnosis not present

## 2016-11-28 MED ORDER — LISINOPRIL-HYDROCHLOROTHIAZIDE 20-25 MG PO TABS: 1.0000 | ORAL_TABLET | Freq: Every day | ORAL | 2 refills | Status: DC

## 2016-11-28 NOTE — Progress Notes (Signed)
Pre visit review using our clinic review tool, if applicable. No additional management support is needed unless otherwise documented below in the visit note. 

## 2016-11-28 NOTE — Telephone Encounter (Signed)
I called and spoke with angela and cancelled PT visits.

## 2016-11-28 NOTE — Progress Notes (Signed)
Chief Complaint  Patient presents with  . Follow-up    4 week    Subjective Dustin [name] is a 51 y.o. male who presents for hypertension follow up. He does not monitor home blood pressures. Blood pressures ranging from 140-150's/90's on average. He is compliant with medications- lisinopril 20 mg daily. Patient has these side effects of medication: none He is adhering to a healthy diet overall. Current exercise: no; home PT   Past Medical History:  Diagnosis Date  . Arthritis    "right knee, hips; lower back"  (11/09/2016)  . Chronic lower back pain   . Dyspnea    with exertion  . Hypertension   . Obesity   . PTSD (post-traumatic stress disorder)    Family History  Problem Relation Age of Onset  . Colon cancer Neg Hx      Medications Current Outpatient Prescriptions on File Prior to Visit  Medication Sig Dispense Refill  . Ascorbic Acid (VITAMIN C PO) Take 1 tablet by mouth daily.    Marland Kitchen aspirin EC 325 MG EC tablet Take 1 tablet (325 mg total) by mouth 2 (two) times daily after a meal. 60 tablet 0  . cyclobenzaprine (FLEXERIL) 10 MG tablet Take 1 tablet (10 mg total) by mouth 3 (three) times daily as needed for muscle spasms. 60 tablet 0  . lisinopril (PRINIVIL,ZESTRIL) 20 MG tablet Take 1 tablet (20 mg total) by mouth daily. 30 tablet 3  . oxyCODONE (ROXICODONE) 15 MG immediate release tablet Take 1 tablet (15 mg total) by mouth every 8 (eight) hours as needed (breakthrough pain). 90 tablet 0  . oxymorphone (OPANA ER) 10 MG 12 hr tablet Take 1 tablet (10 mg total) by mouth every 8 (eight) hours. 90 tablet 0  . sulfamethoxazole-trimethoprim (BACTRIM DS,SEPTRA DS) 800-160 MG tablet Take one tablet po every 12 hours. 90 tablet 0   Allergies Allergies  Allergen Reactions  . Latex Rash and Swelling    Review of Systems Cardiovascular: no chest pain Respiratory:  no shortness of breath  Exam BP (!) 152/110 (BP Location: Left Arm, Patient Position: Sitting, Cuff Size:  Large)   Pulse 95   Temp 98.1 F (36.7 C) (Oral)   Ht 6' 1.5" (1.867 m)   Wt 238 lb (108 kg)   SpO2 97%   BMI 30.97 kg/m  General:  well developed, well nourished, in no apparent distress Skin:  warm, no pallor or diaphoresis Eyes:  pupils equal and round, sclera anicteric without injection Heart :RRR, no murmurs, no bruits, no LE edema Lungs:  clear to auscultation, no accessory muscle use Psych: well oriented with normal range of affect and appropriate judgment/insight  Essential hypertension - Plan: lisinopril (PRINIVIL,ZESTRIL) 20 MG tablet  Orders as above. Add HCTZ, will check labs at next visit.  Counseled on diet and exercise F/u in 1 mo. The patient voiced understanding and agreement to the plan.  [place], DO 11/28/16  9:12 AM

## 2016-11-28 NOTE — Patient Instructions (Addendum)
Do not take the lisinopril anymore, a combination pill has been called in to replace it.   Let us know if you need anything.

## 2016-12-03 ENCOUNTER — Ambulatory Visit (INDEPENDENT_AMBULATORY_CARE_PROVIDER_SITE_OTHER): Payer: Medicare HMO | Admitting: Specialist

## 2016-12-06 ENCOUNTER — Encounter (INDEPENDENT_AMBULATORY_CARE_PROVIDER_SITE_OTHER): Payer: Self-pay

## 2016-12-06 ENCOUNTER — Ambulatory Visit (INDEPENDENT_AMBULATORY_CARE_PROVIDER_SITE_OTHER): Payer: Medicare HMO | Admitting: Specialist

## 2016-12-10 LAB — FUNGUS CULTURE WITH STAIN

## 2016-12-10 LAB — FUNGAL ORGANISM REFLEX

## 2016-12-10 LAB — FUNGUS CULTURE RESULT

## 2016-12-11 ENCOUNTER — Encounter (INDEPENDENT_AMBULATORY_CARE_PROVIDER_SITE_OTHER): Payer: Self-pay | Admitting: Specialist

## 2016-12-11 ENCOUNTER — Ambulatory Visit (INDEPENDENT_AMBULATORY_CARE_PROVIDER_SITE_OTHER): Payer: Medicare HMO

## 2016-12-11 ENCOUNTER — Ambulatory Visit (INDEPENDENT_AMBULATORY_CARE_PROVIDER_SITE_OTHER): Payer: Medicare HMO | Admitting: Specialist

## 2016-12-11 VITALS — BP 183/107 | HR 103 | Temp 97.1°F | Ht 73.5 in | Wt 252.0 lb

## 2016-12-11 DIAGNOSIS — G894 Chronic pain syndrome: Secondary | ICD-10-CM

## 2016-12-11 DIAGNOSIS — M009 Pyogenic arthritis, unspecified: Secondary | ICD-10-CM

## 2016-12-11 MED ORDER — SULFAMETHOXAZOLE-TRIMETHOPRIM 800-160 MG PO TABS: ORAL_TABLET | ORAL | 0 refills | Status: DC

## 2016-12-11 MED ORDER — MELOXICAM 15 MG PO TABS: 15.0000 mg | ORAL_TABLET | Freq: Every day | ORAL | 3 refills | Status: DC

## 2016-12-11 NOTE — Patient Instructions (Signed)
Plan:   May wet knee while bathing.Call if fever or chills or  drainage. Go to ER if acutely short of breath or call for ambulance. Return for follow up in 2 weeks. May full weight bear on the surgical leg unless told otherwise.Take full strength aspirin 325 mg with meal or snack twice a day to prevent blood clots.

## 2016-12-11 NOTE — Addendum Note (Signed)
Addended by: [name] on: 12/11/2016 03:07 PM   Modules accepted: Orders

## 2016-12-11 NOTE — Progress Notes (Signed)
Post-Op Visit Note   Patient: Dustin [name]           Date of Birth: 1965-12-18           MRN: [id number] Visit Date: 12/11/2016 PCP: Shelda Pal, DO   Assessment & Plan:One month post right knee arthroscopic lavage for septic arthritis post removal of  Right femur IM nail distal interlocking screw and a right thigh hematoma cyst cavity.  Chief Complaint:  Chief Complaint  Patient presents with  . Right Knee - Follow-up, Routine Post Op   Visit Diagnoses:  1. Pyogenic arthritis of right knee joint, due to unspecified organism ([place])   2. Chronic pain syndrome   Complains of persistent pain right knee lateral distal femur side of the knee. He was prescribed one month supply of  His baseline narcotic medication at his last visit after discussion with Dr. Primus Bravo with Pain Management. Dr. Primus Bravo Was evaluating Mr. Febles to take over his case and he has  Not prescribed medications as yet. I reviewed the [facility] today and provided Mr. Cham with the opioid hand out information.   Plan:   May wet knee while bathing.Call if fever or chills or  drainage. Go to ER if acutely short of breath or call for ambulance. Return for follow up in 2 weeks. May full weight bear on the surgical leg unless told otherwise.Take full strength aspirin 325 mg with meal or snack twice a day to prevent blood clots. a    Follow-Up Instructions: No Follow-up on file.   Orders:  Orders Placed This Encounter  Procedures  . XR Knee 1-2 Views Right   No orders of the defined types were placed in this encounter.   Imaging: No results found.  PMFS History: Patient Active Problem List   Diagnosis Date Noted  . Chronic pain syndrome 11/27/2016    Priority: High    Class: Chronic  . Septic arthritis of knee, right ([place]) 11/09/2016    Priority: High    Class: Acute  . Traumatic seroma of thigh, right, sequela 09/11/2016    Priority: Low    Class: Chronic  . Retained orthopedic  hardware 09/11/2016    Priority: Low    Class: Chronic  . Effusion, right knee 11/08/2016  . Thigh hematoma, right, sequela 01/23/2016  . Essential hypertension 12/15/2015  . Distal radius fracture, left 04/02/2015  . Closed fracture of right femur ([place]) 04/01/2015   Past Medical History:  Diagnosis Date  . Arthritis    "right knee, hips; lower back"  (11/09/2016)  . Chronic lower back pain   . Dyspnea    with exertion  . Hypertension   . Obesity   . PTSD (post-traumatic stress disorder)     Family History  Problem Relation Age of Onset  . Colon cancer Neg Hx     Past Surgical History:  Procedure Laterality Date  . ANTERIOR CERVICAL DECOMP/DISCECTOMY FUSION  2007   C 5, 6 ,7  . BACK SURGERY    . CLOSED REDUCTION WRIST FRACTURE Left 04/01/2015   Procedure: CLOSED REDUCTION WRIST AND CASTING REPAIR LACERATION TO FINGERS TIMES 2;  Surgeon: Dorna Leitz, MD;  Location: [facility];  Service: Orthopedics;  Laterality: Left;  . FEMUR IM NAIL Right 04/01/2015   Procedure: INTRAMEDULLARY (IM) RETROGRADE FEMORAL NAILING ;  Surgeon: Dorna Leitz, MD;  Location: [facility];  Service: Orthopedics;  Laterality: Right;  . FRACTURE SURGERY    . HARDWARE REMOVAL Right 10/09/2016  Procedure: Dynamization of Nail Right  femur, removal of Screws and Excision of hematoma cavity right thigh;  Surgeon: Jessy Oto, MD;  Location: [place];  Service: Orthopedics;  Laterality: Right;  . HEMATOMA EVACUATION Right 10/09/2016   Procedure: EVACUATION HEMATOMA;  Surgeon: Jessy Oto, MD;  Location: [facility];  Service: Orthopedics;  Laterality: Right;  . KNEE ARTHROSCOPY Right 11/09/2016   I&D  . KNEE ARTHROSCOPY Right 11/09/2016   Procedure: ARTHROSCOPIC IRRIGATION AND DEBRIDEMENT RIGHT KNEE;  Surgeon: Jessy Oto, MD;  Location: [facility];  Service: Orthopedics;  Laterality: Right;  . KNEE ARTHROSCOPY W/ ACL RECONSTRUCTION Right 2001  . LABRAL REPAIR Right 06/2005  . RESECTION DISTAL CLAVICAL Right 2009   Social  History   Occupational History  . Not on file.   Social History Main Topics  . Smoking status: Never Smoker  . Smokeless tobacco: Never Used  . Alcohol use Yes     Comment: 11/09/2016 "might have 1 shot/year; if that"  . Drug use: No  . Sexual activity: Not Currently

## 2016-12-12 LAB — CBC WITH DIFFERENTIAL/PLATELET
BASOS ABS: 23 {cells}/uL (ref 0–200)
BASOS PCT: 0.3 %
EOS ABS: 133 {cells}/uL (ref 15–500)
Eosinophils Relative: 1.7 %
HEMATOCRIT: 41.2 % (ref 38.5–50.0)
HEMOGLOBIN: 13.5 g/dL (ref 13.2–17.1)
Lymphs Abs: 2083 cells/uL (ref 850–3900)
MCH: 27.7 pg (ref 27.0–33.0)
MCHC: 32.8 g/dL (ref 32.0–36.0)
MCV: 84.6 fL (ref 80.0–100.0)
MONOS PCT: 9.4 %
MPV: 10.2 fL (ref 7.5–12.5)
NEUTROS ABS: 4828 {cells}/uL (ref 1500–7800)
Neutrophils Relative %: 61.9 %
Platelets: 223 10*3/uL (ref 140–400)
RBC: 4.87 10*6/uL (ref 4.20–5.80)
RDW: 14.3 % (ref 11.0–15.0)
Total Lymphocyte: 26.7 %
WBC: 7.8 10*3/uL (ref 3.8–10.8)
WBCMIX: 733 {cells}/uL (ref 200–950)

## 2016-12-12 LAB — C-REACTIVE PROTEIN: CRP: 1.3 mg/L (ref <8.0)

## 2016-12-12 LAB — SEDIMENTATION RATE: Sed Rate: 2 mm/h (ref 0–20)

## 2017-01-02 ENCOUNTER — Ambulatory Visit (INDEPENDENT_AMBULATORY_CARE_PROVIDER_SITE_OTHER): Payer: Medicare HMO | Admitting: Specialist

## 2017-01-03 ENCOUNTER — Ambulatory Visit: Payer: Medicare HMO | Admitting: Family Medicine

## 2017-01-07 ENCOUNTER — Ambulatory Visit: Payer: Medicare HMO | Admitting: Family Medicine

## 2017-01-08 ENCOUNTER — Observation Stay (HOSPITAL_COMMUNITY)
Admission: EM | Admit: 2017-01-08 | Discharge: 2017-01-09 | Discharge status: Against Medical Advice | Payer: Medicare HMO | Attending: Orthopaedic Surgery | Admitting: Orthopaedic Surgery

## 2017-01-08 ENCOUNTER — Emergency Department (HOSPITAL_COMMUNITY): Payer: Medicare HMO

## 2017-01-08 ENCOUNTER — Encounter (HOSPITAL_COMMUNITY): Payer: Self-pay

## 2017-01-08 DIAGNOSIS — Z79891 Long term (current) use of opiate analgesic: Secondary | ICD-10-CM | POA: Diagnosis not present

## 2017-01-08 DIAGNOSIS — Z7982 Long term (current) use of aspirin: Secondary | ICD-10-CM | POA: Insufficient documentation

## 2017-01-08 DIAGNOSIS — Z5321 Procedure and treatment not carried out due to patient leaving prior to being seen by health care provider: Secondary | ICD-10-CM | POA: Diagnosis not present

## 2017-01-08 DIAGNOSIS — M25461 Effusion, right knee: Principal | ICD-10-CM | POA: Diagnosis present

## 2017-01-08 DIAGNOSIS — Z79899 Other long term (current) drug therapy: Secondary | ICD-10-CM | POA: Diagnosis not present

## 2017-01-08 DIAGNOSIS — I10 Essential (primary) hypertension: Secondary | ICD-10-CM | POA: Diagnosis not present

## 2017-01-08 DIAGNOSIS — Z791 Long term (current) use of non-steroidal anti-inflammatories (NSAID): Secondary | ICD-10-CM | POA: Insufficient documentation

## 2017-01-08 DIAGNOSIS — M25569 Pain in unspecified knee: Secondary | ICD-10-CM | POA: Diagnosis present

## 2017-01-08 LAB — DIFFERENTIAL
BASOS PCT: 0 %
Basophils Absolute: 0 10*3/uL (ref 0.0–0.1)
EOS ABS: 0.1 10*3/uL (ref 0.0–0.7)
EOS PCT: 1 %
LYMPHS PCT: 26 %
Lymphs Abs: 2.4 10*3/uL (ref 0.7–4.0)
MONOS PCT: 11 %
Monocytes Absolute: 1 10*3/uL (ref 0.1–1.0)
NEUTROS ABS: 5.8 10*3/uL (ref 1.7–7.7)
Neutrophils Relative %: 62 %

## 2017-01-08 LAB — CBC
HEMATOCRIT: 43.3 % (ref 39.0–52.0)
Hemoglobin: 14.2 g/dL (ref 13.0–17.0)
MCH: 28.3 pg (ref 26.0–34.0)
MCHC: 32.8 g/dL (ref 30.0–36.0)
MCV: 86.4 fL (ref 78.0–100.0)
PLATELETS: 216 10*3/uL (ref 150–400)
RBC: 5.01 MIL/uL (ref 4.22–5.81)
RDW: 15.2 % (ref 11.5–15.5)
WBC: 9.5 10*3/uL (ref 4.0–10.5)

## 2017-01-08 LAB — BASIC METABOLIC PANEL
ANION GAP: 8 (ref 5–15)
BUN: 11 mg/dL (ref 6–20)
CALCIUM: 9.2 mg/dL (ref 8.9–10.3)
CO2: 24 mmol/L (ref 22–32)
Chloride: 103 mmol/L (ref 101–111)
Creatinine, Ser: 0.91 mg/dL (ref 0.61–1.24)
GLUCOSE: 111 mg/dL — AB (ref 65–99)
POTASSIUM: 3.7 mmol/L (ref 3.5–5.1)
Sodium: 135 mmol/L (ref 135–145)

## 2017-01-08 MED ORDER — LIDOCAINE HCL 2 % IJ SOLN
5.0000 mL | Freq: Once | INTRAMUSCULAR | Status: AC
  Administered 2017-01-09: 100 mg
  Filled 2017-01-08: qty 20

## 2017-01-08 NOTE — ED Triage Notes (Signed)
Pt recently seen for R knee, had to have fluid drawn of it and was admitted, R knee very swollen and warm, pt is still on antibiotics.

## 2017-01-08 NOTE — ED Provider Notes (Signed)
[facility] EM[facility]GENCY DEPARTMENT Provider Note   CSN: [id number] Arrival date & time: 01/08/17  1954     History   Chief Complaint Chief Complaint  Patient presents with  . Knee Pain    HPI Dustin [name] is a 51 y.o. male.  The history is provided by the patient.  He has a history of septic arthritis of his right knee in September of this year.  Following treatment of that, he was doing well until 2 days ago when his right knee started swelling again.  It has swollen to the point where he cannot move his knee.  He is unable to bear weight.  Prior to the last 2 days, he had been ambulatory and doing well.  He denies any fever or chills but has had some sweats.  He does rate his pain at 10/10.  Pain is worse with any movement or with attempting to ambulate.  Past Medical History:  Diagnosis Date  . Arthritis    "right knee, hips; lower back"  (11/09/2016)  . Chronic lower back pain   . Dyspnea    with exertion  . Hypertension   . Obesity   . PTSD (post-traumatic stress disorder)     Patient Active Problem List   Diagnosis Date Noted  . Chronic pain syndrome 11/27/2016    Class: Chronic  . Septic arthritis of knee, right ([place]) 11/09/2016    Class: Acute  . Effusion, right knee 11/08/2016  . 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 Retained orthopedic hardware 09/11/2016    Class: Chronic  . Thigh hematoma, right, sequela 01/23/2016  . Essential hypertension 12/15/2015  . Distal radius fracture, left 04/02/2015  . Closed fracture of right femur ([place]) 04/01/2015    Past Surgical History:  Procedure Laterality Date  . ANTERIOR CERVICAL DECOMP/DISCECTOMY FUSION  2007   C 5, 6 ,7  . BACK SURGERY    . CLOSED REDUCTION WRIST FRACTURE Left 04/01/2015   Procedure: CLOSED REDUCTION WRIST AND CASTING REPAIR LACERATION TO FINGERS TIMES 2;  Surgeon: Dorna Leitz, MD;  Location: [place];  Service: Orthopedics;  Laterality: Left;    . FEMUR IM NAIL Right 04/01/2015   Procedure: INTRAMEDULLARY (IM) RETROGRADE FEMORAL NAILING ;  Surgeon: Dorna Leitz, MD;  Location: [place];  Service: Orthopedics;  Laterality: Right;  . FRACTURE SURGERY    . HARDWARE REMOVAL Right 10/09/2016   Procedure: Dynamization of Nail Right  femur, removal of Screws and Excision of hematoma cavity right thigh;  Surgeon: Jessy Oto, MD;  Location: [facility];  Service: Orthopedics;  Laterality: Right;  . HEMATOMA EVACUATION Right 10/09/2016   Procedure: EVACUATION HEMATOMA;  Surgeon: Jessy Oto, MD;  Location: [place];  Service: Orthopedics;  Laterality: Right;  . KNEE ARTHROSCOPY Right 11/09/2016   I&D  . KNEE ARTHROSCOPY Right 11/09/2016   Procedure: ARTHROSCOPIC IRRIGATION AND DEBRIDEMENT RIGHT KNEE;  Surgeon: Jessy Oto, MD;  Location: [place];  Service: Orthopedics;  Laterality: Right;  . KNEE ARTHROSCOPY W/ ACL RECONSTRUCTION Right 2001  . LABRAL REPAIR Right 06/2005  . RESECTION DISTAL CLAVICAL Right 2009       Home Medications    Prior to Admission medications   Medication Sig Start Date End Date Taking? Authorizing Provider  Ascorbic Acid (VITAMIN C PO) Take 1 tablet by mouth daily.    [provider]  aspirin EC 325 MG EC tablet Take 1 tablet (325 mg total) by  mouth 2 (two) times daily after a meal. 11/16/16   Jessy Oto, MD  cyclobenzaprine (FLEXERIL) 10 MG tablet Take 1 tablet (10 mg total) by mouth 3 (three) times daily as needed for muscle spasms. 10/10/16   Jessy Oto, MD  lisinopril-hydrochlorothiazide (PRINZIDE,ZESTORETIC) 20-25 MG tablet Take 1 tablet by mouth daily. 11/28/16   Shelda Pal, DO  meloxicam (MOBIC) 15 MG tablet Take 1 tablet (15 mg total) by mouth daily. 12/11/16   Jessy Oto, MD  oxyCODONE (ROXICODONE) 15 MG immediate release tablet Take 1 tablet (15 mg total) by mouth every 8 (eight) hours as needed (breakthrough pain). 11/27/16   Jessy Oto, MD  oxymorphone (OPANA ER) 10 MG 12 hr  tablet Take 1 tablet (10 mg total) by mouth every 8 (eight) hours. 11/27/16   Jessy Oto, MD  sulfamethoxazole-trimethoprim (BACTRIM DS,SEPTRA DS) 800-160 MG tablet Take one tablet po every 12 hours. 12/11/16   Jessy Oto, MD    Family History Family History  Problem Relation Age of Onset  . Colon cancer Neg Hx     Social History Social History   Tobacco Use  . Smoking status: Never Smoker  . Smokeless tobacco: Never Used  Substance Use Topics  . Alcohol use: Yes    Comment: 11/09/2016 "might have 1 shot/year; if that"  . Drug use: No     Allergies   Latex   Review of Systems Review of Systems  All other systems reviewed and are negative.    Physical Exam Updated Vital Signs BP (!) 183/121 (BP Location: Right Arm)   Pulse 72   Temp 98.2 F (36.8 C)   Resp 16   SpO2 99%   Physical Exam  Nursing note and vitals reviewed.  51 year old male, resting comfortably and in no acute distress. Vital signs are significant for hypertension. Oxygen saturation is 99%, which is normal. Head is normocephalic and atraumatic. PERRLA, EOMI. Oropharynx is clear. Neck is nontender and supple without adenopathy or JVD. Back is nontender and there is no CVA tenderness. Lungs are clear without rales, wheezes, or rhonchi. Chest is nontender. Heart has regular rate and rhythm without murmur. Abdomen is soft, flat, nontender without masses or hepatosplenomegaly and peristalsis is normoactive. Extremities: Large effusion present in the right knee.  No erythema or warmth. Skin is warm and dry without rash. Neurologic: Mental status is normal, cranial nerves are intact, there are no motor or sensory deficits.  ED Treatments / Results  Labs (all labs ordered are listed, but only abnormal results are displayed) Labs Reviewed  BASIC METABOLIC PANEL - Abnormal; Notable for the following components:      Result Value   Glucose, Bld 111 (*)    All other components within normal  limits  BODY FLUID CULTURE  CBC  DIFFERENTIAL  SEDIMENTATION RATE  GLUCOSE, BODY FLUID OTHER  SYNOVIAL CELL COUNT + DIFF, W/ CRYSTALS  SYNOVIAL CELL COUNT + DIFF, W/ CRYSTALS    Radiology Dg Knee Complete 4 Views Right  Result Date: 01/08/2017 CLINICAL DATA:  Pain and swelling x2 days. Recent surgery 1 month ago to remove hardware with knee infection 2 weeks ago. EXAM: RIGHT KNEE - COMPLETE 4+ VIEW COMPARISON:  05/18/2016 radiographs and CT from 08/27/2016 FINDINGS: Partially imaged retrograde intramedullary femoral nail with single distal femoral screw is now noted. The more caudal interlocking screw has been removed. No frank bone destruction or acute fracture. Tunneling for ACL repair is noted within the  femur and tibia as before. There is lateral femorotibial joint space narrowing. There is a small suprapatellar joint effusion. There is mild soft tissue swelling along the anterior aspect of the knee and thigh. Small ossifications adjacent to the medial femoral condyle would be in keeping with old remote MCL injury. IMPRESSION: 1. There is a small suprapatellar joint effusion with mild soft tissue swelling. No acute osseous appearing abnormality. 2. Retrograde intramedullary nail fixation of the right femur with single interlocking screw now noted since prior. 3. Status post ACL graft repair. 4. Lateral femorotibial joint space narrowing. Electronically Signed   By: Ashley Royalty M.D.   On: 01/08/2017 21:17    Procedures .Joint Aspiration/Arthrocentesis Date/Time: 01/08/2017 11:34 PM Performed by: Delora Fuel, MD Authorized by: Delora Fuel, MD   Consent:    Consent obtained:  Verbal   Consent given by:  Patient   Risks discussed:  Bleeding, infection and pain   Alternatives discussed:  Delayed treatment and observation Location:    Location:  Knee   Knee:  R knee Anesthesia (see MAR for exact dosages):    Anesthesia method:  Local infiltration   Local anesthetic:   Lidocaine 2% w/o epi Procedure details:    Preparation: Patient was prepped and draped in usual sterile fashion     Needle gauge:  18 G   Ultrasound guidance: no     Approach:  Medial   Aspirate characteristics:  Purulent   Steroid injected: no     Specimen collected: yes   Post-procedure details:    Dressing:  Sterile dressing   Patient tolerance of procedure:  Tolerated well, no immediate complications   (including critical care time)  Medications Ordered in ED Medications  lidocaine (XYLOCAINE) 2 % (with pres) injection 100 mg (not administered)     Initial Impression / Assessment and Plan / ED Course  I have reviewed the triage vital signs and the nursing notes.  Pertinent labs & imaging results that were available during my care of the patient were reviewed by me and considered in my medical decision making (see chart for details).  Right knee effusion.  Old records are reviewed confirming hospitalization in September for septic arthritis with washout in the operating room.  He clearly is at risk for recurrent joint infection.  Will do arthrocentesis.  Arthrocentesis is yielded cloudy, purulent appearing fluid.  Specimens have been sent for cell count, culture, glucose.  Case is discussed with Dr. Ninfa Linden of orthopedic surgery service who agrees to admit the patient.  Final Clinical Impressions(s) / ED Diagnoses   Final diagnoses:  Effusion of right knee    ED Discharge Orders    None       Delora Fuel, MD 16/07/37 [id number]

## 2017-01-09 DIAGNOSIS — M25461 Effusion, right knee: Secondary | ICD-10-CM | POA: Diagnosis present

## 2017-01-09 LAB — SYNOVIAL CELL COUNT + DIFF, W/ CRYSTALS
CRYSTALS FLUID: NONE SEEN
Eosinophils-Synovial: 0 % (ref 0–1)
LYMPHOCYTES-SYNOVIAL FLD: 0 % (ref 0–20)
Monocyte-Macrophage-Synovial Fluid: 6 % — ABNORMAL LOW (ref 50–90)
Neutrophil, Synovial: 94 % — ABNORMAL HIGH (ref 0–25)
WBC, Synovial: 123500 /mm3 — ABNORMAL HIGH (ref 0–200)

## 2017-01-09 LAB — SEDIMENTATION RATE: SED RATE: 15 mm/h (ref 0–16)

## 2017-01-09 LAB — C-REACTIVE PROTEIN: CRP: 5.4 mg/dL — AB (ref <1.0)

## 2017-01-09 MED ORDER — ACETAMINOPHEN 325 MG PO TABS: 650.0000 mg | ORAL_TABLET | Freq: Four times a day (QID) | ORAL | Status: DC | PRN

## 2017-01-09 MED ORDER — SODIUM CHLORIDE 0.9 % IV SOLN: INTRAVENOUS | Status: DC

## 2017-01-09 MED ORDER — LISINOPRIL-HYDROCHLOROTHIAZIDE 20-25 MG PO TABS: 1.0000 | ORAL_TABLET | Freq: Every day | ORAL | Status: DC

## 2017-01-09 MED ORDER — LISINOPRIL 20 MG PO TABS: 20.0000 mg | ORAL_TABLET | Freq: Every day | ORAL | Status: DC

## 2017-01-09 MED ORDER — OXYCODONE HCL 5 MG PO TABS
15.0000 mg | ORAL_TABLET | Freq: Three times a day (TID) | ORAL | Status: DC | PRN
  Administered 2017-01-09: 15 mg via ORAL
  Filled 2017-01-09: qty 3

## 2017-01-09 MED ORDER — MORPHINE SULFATE ER 30 MG PO TBCR
30.0000 mg | EXTENDED_RELEASE_TABLET | Freq: Two times a day (BID) | ORAL | Status: DC
  Filled 2017-01-09: qty 1

## 2017-01-09 MED ORDER — HYDROMORPHONE HCL 1 MG/ML IJ SOLN: 1.0000 mg | INTRAMUSCULAR | Status: DC | PRN

## 2017-01-09 MED ORDER — DIPHENHYDRAMINE HCL 12.5 MG/5ML PO ELIX: 12.5000 mg | ORAL_SOLUTION | ORAL | Status: DC | PRN

## 2017-01-09 MED ORDER — ACETAMINOPHEN 650 MG RE SUPP
650.0000 mg | Freq: Four times a day (QID) | RECTAL | Status: DC | PRN
Start: 2017-01-09 — End: 2017-01-09

## 2017-01-09 MED ORDER — CYCLOBENZAPRINE HCL 10 MG PO TABS: 10.0000 mg | ORAL_TABLET | Freq: Three times a day (TID) | ORAL | Status: DC | PRN

## 2017-01-09 MED ORDER — HYDRALAZINE HCL 20 MG/ML IJ SOLN: 20.0000 mg | Freq: Four times a day (QID) | INTRAMUSCULAR | Status: DC | PRN

## 2017-01-09 MED ORDER — VITAMIN C 500 MG PO TABS: 500.0000 mg | ORAL_TABLET | Freq: Every day | ORAL | Status: DC

## 2017-01-09 MED ORDER — HYDROCHLOROTHIAZIDE 25 MG PO TABS: 25.0000 mg | ORAL_TABLET | Freq: Every day | ORAL | Status: DC

## 2017-01-09 NOTE — Progress Notes (Signed)
Patient ID: Dustin [name], male   DOB: 12-Sep-1965, 51 y.o.   MRN: [id number] The patient ended up leaving the ED AMA.  He was frustrated at being NPO and that we had not ordered Opana for him.  It was explained that a similar medicine was prescribed that is on the hospital's formulary (Oxycontin).  I did put in admission orders at midnight and had him NPO in the event that surgery could be scheduled this morning.  He stayed in the ED all night due to bed availability.  I came to see him this morning since there was not an emergent need for surgery in the early morning hours since his labs and vitals were otherwise stable.  Given the high WBC in his knee and the fact that he has had multiple operations on that leg in the last few months, I wanted to talk to my partner this am about taking over his care or seeing who could potentially wash out the knee again (was done 2 months ago) during the day-time OR schedule.  Again, the patient was not happy about being NPO for a few hours and not getting his Opana, although significant pain meds were ordered.  I reviewed his last admission records and the ID consult at the time recommended a PICC line and 6 weeks of IV antibiotics, which the patient refused.

## 2017-01-09 NOTE — ED Notes (Addendum)
Ninfa Linden MD returned call.  He is made aware of pt's demands regarding his pain meds. He states the hospital does not hold opana/oxymorphone.  Ninfa Linden MD stated "you don't need to keep calling me for anything, the pt is still in the ED so the ED doctor still needs to see the pt and order meds."  He's made aware that he admitted the pt, and is being held in the ED d/t no bed availability.  He was adamant that the EDP still oversee the pt while in the ED.  He's made aware that pt threatened to leave if he does not get his home pain meds.

## 2017-01-09 NOTE — Progress Notes (Signed)
Patient ID: Siddhartha Hoback, male   DOB: Aug 22, 1965, 51 y.o.   MRN: [id number] Apparently the patient has a recurrent effusion of his right knee with chronic issues as it relates to that knee.  My partner Dr. Louanne Skye performed arthroscopic irrigation and debridement of the right knee about 2 months ago.  The patient has recurrent effusion of the knee and the ER staff drained with a felt was purulent material from the knee.  This for his peripheral white blood cell count is normal and his vital signs are stable.  Analysis of fluid taken from the knee is pending.  He will be admitted with the consideration of a possible intervention to the knee tomorrow pending further evaluation.  Apparently at his last hospitalization IV antibiotics were recommended upon discharge but the patient did not want a PICC line and was discharged on oral antibiotics per infectious disease recommendations.  However no Gram stain or culture has been positive thus far on review of his records in epic.

## 2017-01-09 NOTE — ED Notes (Signed)
Pt reports he has been having R knee swelling x 2 days.  Has pins and needles in his R knee d/t an injury has had a pin taken out d/t infection.  Recently was seen for swelling and inflammation.  Had fluids aspirated and was place on abx.  Pt reports non-weight bearing d/t severe pain.  Swelling noted.

## 2017-01-09 NOTE — ED Notes (Signed)
Pt is upset because of the pain meds he is ordered.  He states he normally takes oxycodone IR and oxymorphone.  He states this is what the pain clinic has him on.  He reports the dilaudid and MS contin "messes with my body."  States it makes him nauseous, when this nurse offered zofran for the nausea, he refused.  He went on and on about the orthopedist changing his pain med.  Even after this nurse made him aware that he will be paged to change his pain meds.  He continues to say "he's wacked out for changing my meds.  He can't do that." He states "I'll walk out if he does not change it to what I take normally."

## 2017-01-10 LAB — GLUCOSE, BODY FLUID OTHER: Glucose, Body Fluid Other: 5 mg/dL

## 2017-01-12 LAB — BODY FLUID CULTURE: CULTURE: NO GROWTH

## 2017-01-17 ENCOUNTER — Ambulatory Visit (INDEPENDENT_AMBULATORY_CARE_PROVIDER_SITE_OTHER): Payer: Medicare HMO | Admitting: Specialist

## 2017-01-17 ENCOUNTER — Encounter (INDEPENDENT_AMBULATORY_CARE_PROVIDER_SITE_OTHER): Payer: Self-pay | Admitting: Specialist

## 2017-01-17 VITALS — BP 162/96 | HR 103 | Ht 73.5 in | Wt 252.0 lb

## 2017-01-17 DIAGNOSIS — M009 Pyogenic arthritis, unspecified: Secondary | ICD-10-CM

## 2017-01-17 DIAGNOSIS — Z969 Presence of functional implant, unspecified: Secondary | ICD-10-CM

## 2017-01-17 NOTE — Patient Instructions (Addendum)
  Knee is suffering from septic arthritis with retained IM nail.  Recommend surgical treatment as the most recent aspiration of right knee showed persistent high White Cell Count. This suggests that even with the antibiotics infection is persisting. Will schedule you to come into the hospital next Tuesday or Friday to undergo removal of the right Femoral IM Nail and the possible cement nail vs IV antibiotics and then return to po antibiotics. Avoid bending, stooping and avoid lifting weights greater than 10 lbs. Avoid prolong standing and walking. Order for a new walker with wheels. Surgery scheduling secretary Kandice Hams, will call you in the next day to schedule for surgery. . Risk of surgery includes risk of infection 1 in 200 patients, bleeding 1/2% chance you would need a transfusion.   Risk to the nerves is one in 10,000. Expect improved walking and standing tolerance.

## 2017-01-17 NOTE — Progress Notes (Addendum)
Office Visit Note   Patient: Dustin [name]           Date of Birth: 05/13/1965           MRN: [id number] Visit Date: 01/17/2017              Requested by: Shelda Pal, [address] [place], [place] [address] PCP: Shelda Pal, DO   Assessment & Plan: Visit Diagnoses:  1. Chronic infection of right knee ([place])   2. Retained orthopedic hardware   51 year old male with right knee swelling, seen in the ER at [facility] last week by Dr. Ninfa Linden evaluated with aspiration of the right knee showing 125K WBC. Recommended surgical irrigation and debridement and removal of the retained IM nail right femur. Left AMA when he was unable to have his opana Rx given in the hospital. He sees Dr. Primus Bravo in pain management, last seen one month ago and has another appointment on 01/23/2017. DIscussed need for surgical treatment of the right knee, removal of the IM nail. He wants to wait till after he sees Dr. Primus Bravo for his appt on 12/5 with try to schedule for removal of the IM nail with I and D of the right knee to be done as soon as Dustin [name] will allow, he preferrs next Friday 12/7, will try to schedule at that time. No narcotics prescribed today.   Plan:  Knee is suffering from septic arthritis with retained IM nail.  Recommend surgical treatment as the most recent aspiration of right knee showed persistent high White Cell Count. This suggests that even with the antibiotics infection is persisting. Will schedule you to come into the hospital next Tuesday or Friday to undergo removal of the right Femoral IM Nail and the possible cement nail vs IV antibiotics and then return to po antibiotics. Avoid bending, stooping and avoid lifting weights greater than 10 lbs. Avoid prolong standing and walking. Order for a new walker with wheels. Surgery scheduling secretary Kandice Hams, will call you in the next day to schedule for surgery. . Risk of surgery includes risk of infection  1 in 200 patients, bleeding 1/2% chance you would need a transfusion.   Risk to the nerves is one in 10,000. Expect improved walking and standing tolerance.    Follow-Up Instructions: Return in about 3 weeks (around 02/07/2017).   Orders:  No orders of the defined types were placed in this encounter.  No orders of the defined types were placed in this encounter.     Procedures: No procedures performed   Clinical Data: No additional findings.   Subjective: Chief Complaint  Patient presents with  . Right Leg - Routine Post Op, Wound Check, Follow-up    51 year old male with right femur fracture almost 1 year and 8 months post right IM nail by Dr. Mayer Camel. He underwent resection of a large hematoma cyst and dynaminization of the right IM nail with removal of the proximal interlocking screws and the distal most distal interlocking. About 4 weeks later he developed a right knee pyarthrosis and underwent arthroscopic debridement of the right Knee and has been on septra BID since the time of surgery 11/09/2016. Last week he returned to the ER with worsening swelling in the right leg. Seen by Dr. Ninfa Linden 01/09/2017 and advised that he should have removal of the right femur IM nail. Aspiration of the right knee showed 125K WBC with left shift. Sed rate was 15.  He is still on septra DS. Ambulation is with a walker. He reports the He left [facility] and did not want surgery    Review of Systems  Constitutional: Negative.   HENT: Negative.   Eyes: Negative.   Respiratory: Negative.   Cardiovascular: Negative.   Gastrointestinal: Negative.   Endocrine: Negative.   Genitourinary: Negative.   Musculoskeletal: Negative.   Skin: Negative.   Allergic/Immunologic: Negative.   Neurological: Negative.   Hematological: Negative.   Psychiatric/Behavioral: Negative.      Objective: Vital Signs: BP (!) 162/96 (BP Location: Left Arm, Patient Position: Sitting)   Pulse (!) 103   Ht 6' 1.5" (1.867  m)   Wt 252 lb (114.3 kg)   BMI 32.80 kg/m   Physical Exam  Constitutional: He is oriented to person, place, and time. He appears well-developed and well-nourished.  HENT:  Head: Normocephalic and atraumatic.  Eyes: EOM are normal. Pupils are equal, round, and reactive to light.  Neck: Normal range of motion. Neck supple.  Pulmonary/Chest: Effort normal and breath sounds normal.  Abdominal: Soft. Bowel sounds are normal.  Neurological: He is alert and oriented to person, place, and time.  Skin: Skin is warm and dry.  Psychiatric: He has a normal mood and affect. His behavior is normal. Judgment and thought content normal.    Right Knee Exam   Muscle Strength  The patient has normal right knee strength.  Range of Motion  Extension:  -10 abnormal  Flexion:  120 abnormal   Tests  McMurray:  Medial - negative Lateral - negative Varus: negative  Lachman:  Anterior - negative    Posterior - negative Drawer:  Anterior - negative    Posterior - negative  Other  Erythema: absent Scars: present Sensation: decreased Pulse: present Swelling: moderate  Comments:  Right knee with effusion, positive ballotment sign.       Specialty Comments:  No specialty comments available.  Imaging: No results found.   PMFS History: Patient Active Problem List   Diagnosis Date Noted  . Chronic pain syndrome 11/27/2016    Priority: High    Class: Chronic  . Septic arthritis of knee, right ([place]) 11/09/2016    Priority: High    Class: Acute  . Traumatic seroma of thigh, right, sequela 09/11/2016    Priority: Low    Class: Chronic  . Retained orthopedic hardware 09/11/2016    Priority: Low    Class: Chronic  . Effusion of right knee joint 01/09/2017  . Effusion of right knee 11/08/2016  . Thigh hematoma, right, sequela 01/23/2016  . Essential hypertension 12/15/2015  . Distal radius fracture, left 04/02/2015  . Closed fracture of right femur ([place]) 04/01/2015   Past Medical  History:  Diagnosis Date  . Arthritis    "right knee, hips; lower back"  (11/09/2016)  . Chronic lower back pain   . Dyspnea    with exertion  . Hypertension   . Obesity   . PTSD (post-traumatic stress disorder)     Family History  Problem Relation Age of Onset  . Colon cancer Neg Hx     Past Surgical History:  Procedure Laterality Date  . ANTERIOR CERVICAL DECOMP/DISCECTOMY FUSION  2007   C 5, 6 ,7  . BACK SURGERY    . CLOSED REDUCTION WRIST FRACTURE Left 04/01/2015   Procedure: CLOSED REDUCTION WRIST AND CASTING REPAIR LACERATION TO FINGERS TIMES 2;  Surgeon: Dorna Leitz, MD;  Location: [place];  Service: Orthopedics;  Laterality: Left;  .  FEMUR IM NAIL Right 04/01/2015   Procedure: INTRAMEDULLARY (IM) RETROGRADE FEMORAL NAILING ;  Surgeon: Dorna Leitz, MD;  Location: [place];  Service: Orthopedics;  Laterality: Right;  . FRACTURE SURGERY    . HARDWARE REMOVAL Right 10/09/2016   Procedure: Dynamization of Nail Right  femur, removal of Screws and Excision of hematoma cavity right thigh;  Surgeon: Jessy Oto, MD;  Location: [place];  Service: Orthopedics;  Laterality: Right;  . HEMATOMA EVACUATION Right 10/09/2016   Procedure: EVACUATION HEMATOMA;  Surgeon: Jessy Oto, MD;  Location: [place];  Service: Orthopedics;  Laterality: Right;  . KNEE ARTHROSCOPY Right 11/09/2016   I&D  . KNEE ARTHROSCOPY Right 11/09/2016   Procedure: ARTHROSCOPIC IRRIGATION AND DEBRIDEMENT RIGHT KNEE;  Surgeon: Jessy Oto, MD;  Location: [place];  Service: Orthopedics;  Laterality: Right;  . KNEE ARTHROSCOPY W/ ACL RECONSTRUCTION Right 2001  . LABRAL REPAIR Right 06/2005  . RESECTION DISTAL CLAVICAL Right 2009   Social History   Occupational History  . Not on file  Tobacco Use  . Smoking status: Never Smoker  . Smokeless tobacco: Never Used  Substance and Sexual Activity  . Alcohol use: Yes    Comment: 11/09/2016 "might have 1 shot/year; if that"  . Drug use: No  . Sexual activity: Not Currently

## 2017-01-23 ENCOUNTER — Other Ambulatory Visit: Payer: Self-pay

## 2017-01-23 ENCOUNTER — Encounter (HOSPITAL_COMMUNITY): Payer: Self-pay | Admitting: *Deleted

## 2017-01-23 NOTE — Progress Notes (Signed)
Spoke with pt for pre-op call. Pt denies cardiac history, chest pain, sob or diabetes. 

## 2017-01-25 ENCOUNTER — Inpatient Hospital Stay (HOSPITAL_COMMUNITY): Payer: Medicare HMO | Admitting: Certified Registered Nurse Anesthetist

## 2017-01-25 ENCOUNTER — Inpatient Hospital Stay (HOSPITAL_COMMUNITY): Payer: Medicare HMO

## 2017-01-25 ENCOUNTER — Inpatient Hospital Stay (HOSPITAL_COMMUNITY)
Admission: RE | Admit: 2017-01-25 | Discharge: 2017-01-30 | DRG: 486 | Disposition: A | Discharge status: Home Health | Payer: Medicare HMO | Source: Ambulatory Visit | Attending: Specialist | Admitting: Specialist

## 2017-01-25 ENCOUNTER — Encounter (HOSPITAL_COMMUNITY)
Admission: RE | Disposition: A | Discharge status: Home Health | Payer: Self-pay | Source: Ambulatory Visit | Attending: Specialist

## 2017-01-25 ENCOUNTER — Encounter (HOSPITAL_COMMUNITY): Payer: Self-pay | Admitting: Certified Registered"

## 2017-01-25 ENCOUNTER — Other Ambulatory Visit: Payer: Self-pay

## 2017-01-25 DIAGNOSIS — Z91040 Latex allergy status: Secondary | ICD-10-CM | POA: Diagnosis not present

## 2017-01-25 DIAGNOSIS — M79604 Pain in right leg: Secondary | ICD-10-CM | POA: Diagnosis present

## 2017-01-25 DIAGNOSIS — G8929 Other chronic pain: Secondary | ICD-10-CM | POA: Diagnosis present

## 2017-01-25 DIAGNOSIS — I10 Essential (primary) hypertension: Secondary | ICD-10-CM | POA: Diagnosis present

## 2017-01-25 DIAGNOSIS — M009 Pyogenic arthritis, unspecified: Secondary | ICD-10-CM | POA: Diagnosis present

## 2017-01-25 DIAGNOSIS — Z6832 Body mass index (BMI) 32.0-32.9, adult: Secondary | ICD-10-CM | POA: Diagnosis not present

## 2017-01-25 DIAGNOSIS — E669 Obesity, unspecified: Secondary | ICD-10-CM | POA: Diagnosis present

## 2017-01-25 DIAGNOSIS — Y793 Surgical instruments, materials and orthopedic devices (including sutures) associated with adverse incidents: Secondary | ICD-10-CM | POA: Diagnosis present

## 2017-01-25 DIAGNOSIS — M545 Low back pain: Secondary | ICD-10-CM | POA: Diagnosis present

## 2017-01-25 DIAGNOSIS — Z981 Arthrodesis status: Secondary | ICD-10-CM

## 2017-01-25 DIAGNOSIS — F4310 Post-traumatic stress disorder, unspecified: Secondary | ICD-10-CM | POA: Diagnosis present

## 2017-01-25 DIAGNOSIS — Z886 Allergy status to analgesic agent status: Secondary | ICD-10-CM

## 2017-01-25 DIAGNOSIS — T8484XA Pain due to internal orthopedic prosthetic devices, implants and grafts, initial encounter: Secondary | ICD-10-CM | POA: Diagnosis present

## 2017-01-25 DIAGNOSIS — T84620A Infection and inflammatory reaction due to internal fixation device of right femur, initial encounter: Secondary | ICD-10-CM | POA: Diagnosis present

## 2017-01-25 DIAGNOSIS — M86651 Other chronic osteomyelitis, right thigh: Secondary | ICD-10-CM

## 2017-01-25 DIAGNOSIS — Z9889 Other specified postprocedural states: Secondary | ICD-10-CM

## 2017-01-25 DIAGNOSIS — M00061 Staphylococcal arthritis, right knee: Secondary | ICD-10-CM | POA: Diagnosis not present

## 2017-01-25 DIAGNOSIS — Z969 Presence of functional implant, unspecified: Secondary | ICD-10-CM | POA: Diagnosis not present

## 2017-01-25 HISTORY — PX: HARDWARE REMOVAL: SHX979

## 2017-01-25 LAB — COMPREHENSIVE METABOLIC PANEL
ALBUMIN: 3.6 g/dL (ref 3.5–5.0)
ALK PHOS: 87 U/L (ref 38–126)
ALT: 13 U/L — AB (ref 17–63)
AST: 20 U/L (ref 15–41)
Anion gap: 9 (ref 5–15)
BILIRUBIN TOTAL: 0.3 mg/dL (ref 0.3–1.2)
BUN: 13 mg/dL (ref 6–20)
CALCIUM: 8.9 mg/dL (ref 8.9–10.3)
CO2: 23 mmol/L (ref 22–32)
CREATININE: 1 mg/dL (ref 0.61–1.24)
Chloride: 106 mmol/L (ref 101–111)
GFR calc non Af Amer: >60 mL/min (ref >60)
GLUCOSE: 95 mg/dL (ref 65–99)
Potassium: 3.7 mmol/L (ref 3.5–5.1)
SODIUM: 138 mmol/L (ref 135–145)
Total Protein: 6.9 g/dL (ref 6.5–8.1)

## 2017-01-25 LAB — PROTIME-INR
INR: 0.99
Prothrombin Time: 13 seconds (ref 11.4–15.2)

## 2017-01-25 LAB — CBC
HEMATOCRIT: 41.6 % (ref 39.0–52.0)
HEMOGLOBIN: 13.4 g/dL (ref 13.0–17.0)
MCH: 27.7 pg (ref 26.0–34.0)
MCHC: 32.2 g/dL (ref 30.0–36.0)
MCV: 86 fL (ref 78.0–100.0)
Platelets: 394 10*3/uL (ref 150–400)
RBC: 4.84 MIL/uL (ref 4.22–5.81)
RDW: 14.7 % (ref 11.5–15.5)
WBC: 7.3 10*3/uL (ref 4.0–10.5)

## 2017-01-25 LAB — APTT: aPTT: 31 seconds (ref 24–36)

## 2017-01-25 LAB — MRSA PCR SCREENING: MRSA BY PCR: NEGATIVE

## 2017-01-25 SURGERY — REMOVAL, HARDWARE
Anesthesia: General | Site: Knee | Laterality: Right

## 2017-01-25 MED ORDER — BUPIVACAINE LIPOSOME 1.3 % IJ SUSP
20.0000 mL | INTRAMUSCULAR | Status: DC
  Administered 2017-01-25: 266 mg
  Filled 2017-01-25: qty 20

## 2017-01-25 MED ORDER — FENTANYL CITRATE (PF) 250 MCG/5ML IJ SOLN
INTRAMUSCULAR | Status: AC
  Filled 2017-01-25: qty 5

## 2017-01-25 MED ORDER — LISINOPRIL 20 MG PO TABS
20.0000 mg | ORAL_TABLET | Freq: Every day | ORAL | Status: DC
  Administered 2017-01-26: 20 mg via ORAL
  Filled 2017-01-25 (×5): qty 1

## 2017-01-25 MED ORDER — PHENYLEPHRINE 40 MCG/ML (10ML) SYRINGE FOR IV PUSH (FOR BLOOD PRESSURE SUPPORT)
PREFILLED_SYRINGE | INTRAVENOUS | Status: DC | PRN
  Administered 2017-01-25 (×2): 80 ug via INTRAVENOUS
  Administered 2017-01-25: 40 ug via INTRAVENOUS
  Administered 2017-01-25: 80 ug via INTRAVENOUS

## 2017-01-25 MED ORDER — VANCOMYCIN HCL IN DEXTROSE 1-5 GM/200ML-% IV SOLN
1000.0000 mg | Freq: Two times a day (BID) | INTRAVENOUS | Status: AC
  Administered 2017-01-25: 1000 mg via INTRAVENOUS
  Filled 2017-01-25: qty 200

## 2017-01-25 MED ORDER — ACETAMINOPHEN 325 MG PO TABS: 650.0000 mg | ORAL_TABLET | ORAL | Status: DC | PRN

## 2017-01-25 MED ORDER — MORPHINE SULFATE (PF) 2 MG/ML IV SOLN
2.0000 mg | INTRAVENOUS | Status: DC | PRN
  Administered 2017-01-25 – 2017-01-29 (×7): 2 mg via INTRAVENOUS
  Filled 2017-01-25 (×8): qty 1

## 2017-01-25 MED ORDER — DEXAMETHASONE SODIUM PHOSPHATE 10 MG/ML IJ SOLN
INTRAMUSCULAR | Status: DC | PRN
  Administered 2017-01-25: 10 mg via INTRAVENOUS

## 2017-01-25 MED ORDER — ONDANSETRON HCL 4 MG/2ML IJ SOLN
INTRAMUSCULAR | Status: AC
  Filled 2017-01-25: qty 2

## 2017-01-25 MED ORDER — OXYMORPHONE HCL ER 10 MG PO TB12: 10.0000 mg | ORAL_TABLET | Freq: Three times a day (TID) | ORAL | Status: DC

## 2017-01-25 MED ORDER — SULFAMETHOXAZOLE-TRIMETHOPRIM 800-160 MG PO TABS
1.0000 | ORAL_TABLET | Freq: Two times a day (BID) | ORAL | Status: DC
  Administered 2017-01-25 – 2017-01-26 (×2): 1 via ORAL
  Filled 2017-01-25 (×2): qty 1

## 2017-01-25 MED ORDER — DEXAMETHASONE SODIUM PHOSPHATE 10 MG/ML IJ SOLN
INTRAMUSCULAR | Status: AC
  Filled 2017-01-25: qty 1

## 2017-01-25 MED ORDER — METOCLOPRAMIDE HCL 5 MG/ML IJ SOLN: 5.0000 mg | Freq: Three times a day (TID) | INTRAMUSCULAR | Status: DC | PRN

## 2017-01-25 MED ORDER — FLEET ENEMA 7-19 GM/118ML RE ENEM: 1.0000 | ENEMA | Freq: Once | RECTAL | Status: DC | PRN

## 2017-01-25 MED ORDER — METOCLOPRAMIDE HCL 5 MG PO TABS: 5.0000 mg | ORAL_TABLET | Freq: Three times a day (TID) | ORAL | Status: DC | PRN

## 2017-01-25 MED ORDER — POLYETHYLENE GLYCOL 3350 17 G PO PACK: 17.0000 g | PACK | Freq: Every day | ORAL | Status: DC | PRN

## 2017-01-25 MED ORDER — ONDANSETRON HCL 4 MG/2ML IJ SOLN: 4.0000 mg | Freq: Four times a day (QID) | INTRAMUSCULAR | Status: DC | PRN

## 2017-01-25 MED ORDER — KETAMINE HCL 10 MG/ML IJ SOLN
INTRAMUSCULAR | Status: DC | PRN
  Administered 2017-01-25: 50 mg via INTRAVENOUS

## 2017-01-25 MED ORDER — PROPOFOL 10 MG/ML IV BOLUS
INTRAVENOUS | Status: AC
  Filled 2017-01-25: qty 20

## 2017-01-25 MED ORDER — OXYCODONE HCL 5 MG PO TABS
10.0000 mg | ORAL_TABLET | ORAL | Status: DC | PRN
  Filled 2017-01-25 (×2): qty 2

## 2017-01-25 MED ORDER — ACETAMINOPHEN 650 MG RE SUPP: 650.0000 mg | RECTAL | Status: DC | PRN

## 2017-01-25 MED ORDER — ROCURONIUM BROMIDE 50 MG/5ML IV SOSY
PREFILLED_SYRINGE | INTRAVENOUS | Status: DC | PRN
  Administered 2017-01-25: 50 mg via INTRAVENOUS

## 2017-01-25 MED ORDER — OXYCODONE HCL 5 MG PO TABS: 15.0000 mg | ORAL_TABLET | Freq: Three times a day (TID) | ORAL | Status: DC | PRN

## 2017-01-25 MED ORDER — ASPIRIN EC 325 MG PO TBEC
325.0000 mg | DELAYED_RELEASE_TABLET | Freq: Two times a day (BID) | ORAL | Status: DC
  Administered 2017-01-25 – 2017-01-30 (×10): 325 mg via ORAL
  Filled 2017-01-25 (×10): qty 1

## 2017-01-25 MED ORDER — SODIUM CHLORIDE 0.9 % IR SOLN
Status: DC | PRN
  Administered 2017-01-25: 4000 mL

## 2017-01-25 MED ORDER — MIDAZOLAM HCL 2 MG/2ML IJ SOLN
INTRAMUSCULAR | Status: AC
  Filled 2017-01-25: qty 2

## 2017-01-25 MED ORDER — KETAMINE HCL-SODIUM CHLORIDE 100-0.9 MG/10ML-% IV SOSY
PREFILLED_SYRINGE | INTRAVENOUS | Status: AC
  Filled 2017-01-25: qty 10

## 2017-01-25 MED ORDER — HYDROCHLOROTHIAZIDE 25 MG PO TABS
25.0000 mg | ORAL_TABLET | Freq: Every day | ORAL | Status: DC
  Administered 2017-01-26: 25 mg via ORAL
  Filled 2017-01-25 (×5): qty 1

## 2017-01-25 MED ORDER — MELOXICAM 7.5 MG PO TABS: 15.0000 mg | ORAL_TABLET | Freq: Every day | ORAL | Status: DC

## 2017-01-25 MED ORDER — BISACODYL 5 MG PO TBEC: 5.0000 mg | DELAYED_RELEASE_TABLET | Freq: Every day | ORAL | Status: DC | PRN

## 2017-01-25 MED ORDER — LISINOPRIL-HYDROCHLOROTHIAZIDE 20-25 MG PO TABS: 1.0000 | ORAL_TABLET | Freq: Every day | ORAL | Status: DC

## 2017-01-25 MED ORDER — DOCUSATE SODIUM 100 MG PO CAPS
100.0000 mg | ORAL_CAPSULE | Freq: Two times a day (BID) | ORAL | Status: DC
  Administered 2017-01-25 – 2017-01-30 (×9): 100 mg via ORAL
  Filled 2017-01-25 (×10): qty 1

## 2017-01-25 MED ORDER — CYCLOBENZAPRINE HCL 10 MG PO TABS
10.0000 mg | ORAL_TABLET | Freq: Three times a day (TID) | ORAL | Status: DC | PRN
  Administered 2017-01-25 – 2017-01-30 (×9): 10 mg via ORAL
  Filled 2017-01-25 (×11): qty 1

## 2017-01-25 MED ORDER — PROPOFOL 10 MG/ML IV BOLUS
INTRAVENOUS | Status: DC | PRN
  Administered 2017-01-25: 200 mg via INTRAVENOUS

## 2017-01-25 MED ORDER — ONDANSETRON HCL 4 MG PO TABS: 4.0000 mg | ORAL_TABLET | Freq: Four times a day (QID) | ORAL | Status: DC | PRN

## 2017-01-25 MED ORDER — FERROUS SULFATE 325 (65 FE) MG PO TABS
325.0000 mg | ORAL_TABLET | Freq: Three times a day (TID) | ORAL | Status: DC
  Administered 2017-01-25: 325 mg via ORAL
  Filled 2017-01-25 (×6): qty 1

## 2017-01-25 MED ORDER — LIDOCAINE 2% (20 MG/ML) 5 ML SYRINGE
INTRAMUSCULAR | Status: DC | PRN
  Administered 2017-01-25: 100 mg via INTRAVENOUS

## 2017-01-25 MED ORDER — ONDANSETRON HCL 4 MG/2ML IJ SOLN
INTRAMUSCULAR | Status: DC | PRN
  Administered 2017-01-25: 4 mg via INTRAVENOUS

## 2017-01-25 MED ORDER — OXYCODONE HCL 5 MG PO TABS
15.0000 mg | ORAL_TABLET | ORAL | Status: DC | PRN
  Administered 2017-01-25 – 2017-01-26 (×4): 15 mg via ORAL
  Filled 2017-01-25 (×3): qty 3

## 2017-01-25 MED ORDER — OXYCODONE HCL 5 MG PO TABS
ORAL_TABLET | ORAL | Status: AC
  Administered 2017-01-25: 17:00:00
  Filled 2017-01-25: qty 3

## 2017-01-25 MED ORDER — MORPHINE SULFATE ER 15 MG PO TBCR
30.0000 mg | EXTENDED_RELEASE_TABLET | Freq: Two times a day (BID) | ORAL | Status: DC
  Administered 2017-01-25 – 2017-01-30 (×10): 30 mg via ORAL
  Filled 2017-01-25 (×10): qty 2

## 2017-01-25 MED ORDER — PHENYLEPHRINE 40 MCG/ML (10ML) SYRINGE FOR IV PUSH (FOR BLOOD PRESSURE SUPPORT)
PREFILLED_SYRINGE | INTRAVENOUS | Status: AC
  Filled 2017-01-25: qty 10

## 2017-01-25 MED ORDER — FENTANYL CITRATE (PF) 100 MCG/2ML IJ SOLN
INTRAMUSCULAR | Status: DC | PRN
  Administered 2017-01-25 (×2): 100 ug via INTRAVENOUS
  Administered 2017-01-25: 50 ug via INTRAVENOUS
  Administered 2017-01-25: 100 ug via INTRAVENOUS
  Administered 2017-01-25: 50 ug via INTRAVENOUS
  Administered 2017-01-25: 100 ug via INTRAVENOUS

## 2017-01-25 MED ORDER — HYDROMORPHONE HCL 1 MG/ML IJ SOLN
INTRAMUSCULAR | Status: DC | PRN
  Administered 2017-01-25: 0.5 mg via INTRAVENOUS

## 2017-01-25 MED ORDER — DIPHENHYDRAMINE HCL 12.5 MG/5ML PO ELIX: 12.5000 mg | ORAL_SOLUTION | ORAL | Status: DC | PRN

## 2017-01-25 MED ORDER — LACTATED RINGERS IV SOLN
INTRAVENOUS | Status: DC
  Administered 2017-01-25 (×2): via INTRAVENOUS

## 2017-01-25 MED ORDER — SODIUM CHLORIDE 0.9 % IV SOLN
INTRAVENOUS | Status: DC
  Administered 2017-01-25 – 2017-01-29 (×2): via INTRAVENOUS

## 2017-01-25 MED ORDER — LIDOCAINE 2% (20 MG/ML) 5 ML SYRINGE
INTRAMUSCULAR | Status: AC
  Filled 2017-01-25: qty 5

## 2017-01-25 MED ORDER — MIDAZOLAM HCL 2 MG/2ML IJ SOLN
INTRAMUSCULAR | Status: DC | PRN
  Administered 2017-01-25: 2 mg via INTRAVENOUS

## 2017-01-25 MED ORDER — HYDROMORPHONE HCL 1 MG/ML IJ SOLN
INTRAMUSCULAR | Status: AC
  Filled 2017-01-25: qty 0.5

## 2017-01-25 MED ORDER — POLYMYXIN B SULFATE 500000 UNITS IJ SOLR
INTRAMUSCULAR | Status: DC | PRN
  Administered 2017-01-25: 500 mL

## 2017-01-25 SURGICAL SUPPLY — 75 items
BAG DECANTER FOR FLEXI CONT (MISCELLANEOUS) IMPLANT
BANDAGE ACE 4X5 VEL STRL LF (GAUZE/BANDAGES/DRESSINGS) IMPLANT
BANDAGE ACE 6X5 VEL STRL LF (GAUZE/BANDAGES/DRESSINGS) IMPLANT
BANDAGE ELASTIC 4 VELCRO ST LF (GAUZE/BANDAGES/DRESSINGS) ×4 IMPLANT
BANDAGE ELASTIC 6 VELCRO ST LF (GAUZE/BANDAGES/DRESSINGS) ×4 IMPLANT
BANDAGE ESMARK 6X9 LF (GAUZE/BANDAGES/DRESSINGS) IMPLANT
BNDG COHESIVE 4X5 TAN STRL (GAUZE/BANDAGES/DRESSINGS) ×4 IMPLANT
BNDG ESMARK 6X9 LF (GAUZE/BANDAGES/DRESSINGS)
BNDG GAUZE ELAST 4 BULKY (GAUZE/BANDAGES/DRESSINGS) ×4 IMPLANT
COVER SURGICAL LIGHT HANDLE (MISCELLANEOUS) ×4 IMPLANT
CUFF TOURNIQUET SINGLE 18IN (TOURNIQUET CUFF) IMPLANT
CUFF TOURNIQUET SINGLE 24IN (TOURNIQUET CUFF) IMPLANT
CUFF TOURNIQUET SINGLE 34IN LL (TOURNIQUET CUFF) ×4 IMPLANT
CUFF TOURNIQUET SINGLE 44IN (TOURNIQUET CUFF) IMPLANT
DRAPE C-ARM 42X72 X-RAY (DRAPES) ×4 IMPLANT
DRAPE HALF SHEET 40X57 (DRAPES) IMPLANT
DRAPE INCISE IOBAN 66X45 STRL (DRAPES) IMPLANT
DRAPE ORTHO SPLIT 77X108 STRL (DRAPES) ×2
DRAPE SURG ORHT 6 SPLT 77X108 (DRAPES) ×2 IMPLANT
DRAPE U-SHAPE 47X51 STRL (DRAPES) ×4 IMPLANT
DRSG ADAPTIC 3X8 NADH LF (GAUZE/BANDAGES/DRESSINGS) ×4 IMPLANT
DRSG EMULSION OIL 3X3 NADH (GAUZE/BANDAGES/DRESSINGS) IMPLANT
DRSG PAD ABDOMINAL 8X10 ST (GAUZE/BANDAGES/DRESSINGS) IMPLANT
DURAPREP 26ML APPLICATOR (WOUND CARE) ×4 IMPLANT
ELECT CAUTERY BLADE 6.4 (BLADE) ×4 IMPLANT
ELECT REM PT RETURN 9FT ADLT (ELECTROSURGICAL) ×4
ELECTRODE REM PT RTRN 9FT ADLT (ELECTROSURGICAL) ×2 IMPLANT
EVACUATOR 1/8 PVC DRAIN (DRAIN) ×4 IMPLANT
EVACUATOR 3/16  PVC DRAIN (DRAIN) ×2
EVACUATOR 3/16 PVC DRAIN (DRAIN) ×2 IMPLANT
GAUZE SPONGE 4X4 12PLY STRL (GAUZE/BANDAGES/DRESSINGS) IMPLANT
GAUZE SPONGE 4X4 12PLY STRL LF (GAUZE/BANDAGES/DRESSINGS) ×4 IMPLANT
GLOVE BIOGEL PI IND STRL 8 (GLOVE) ×2 IMPLANT
GLOVE BIOGEL PI INDICATOR 8 (GLOVE) ×2
GLOVE ECLIPSE 9.0 STRL (GLOVE) ×4 IMPLANT
GLOVE ORTHO TXT STRL SZ7.5 (GLOVE) ×4 IMPLANT
GLOVE SS N UNI LF 7.5 STRL (GLOVE) ×4 IMPLANT
GLOVE SURG 8.5 LATEX PF (GLOVE) ×4 IMPLANT
GOWN STRL REUS W/ TWL LRG LVL3 (GOWN DISPOSABLE) ×2 IMPLANT
GOWN STRL REUS W/TWL 2XL LVL3 (GOWN DISPOSABLE) ×8 IMPLANT
GOWN STRL REUS W/TWL LRG LVL3 (GOWN DISPOSABLE) ×2
GUIDEWIRE BEAD TIP (WIRE) ×4 IMPLANT
HANDPIECE INTERPULSE COAX TIP (DISPOSABLE) ×2
KIT BASIN OR (CUSTOM PROCEDURE TRAY) ×4 IMPLANT
KIT ROOM TURNOVER OR (KITS) ×4 IMPLANT
MANIFOLD NEPTUNE II (INSTRUMENTS) ×4 IMPLANT
NS IRRIG 1000ML POUR BTL (IV SOLUTION) ×4 IMPLANT
PACK GENERAL/GYN (CUSTOM PROCEDURE TRAY) IMPLANT
PACK ORTHO EXTREMITY (CUSTOM PROCEDURE TRAY) ×4 IMPLANT
PAD ABD 8X10 STRL (GAUZE/BANDAGES/DRESSINGS) ×4 IMPLANT
PAD ARMBOARD 7.5X6 YLW CONV (MISCELLANEOUS) ×8 IMPLANT
PAD CAST 4YDX4 CTTN HI CHSV (CAST SUPPLIES) IMPLANT
PADDING CAST COTTON 4X4 STRL (CAST SUPPLIES)
PENCIL BUTTON HOLSTER BLD 10FT (ELECTRODE) IMPLANT
SET HNDPC FAN SPRY TIP SCT (DISPOSABLE) ×2 IMPLANT
SPONGE LAP 18X18 X RAY DECT (DISPOSABLE) ×4 IMPLANT
SPONGE LAP 4X18 X RAY DECT (DISPOSABLE) IMPLANT
STAPLER VISISTAT 35W (STAPLE) IMPLANT
STOCKINETTE IMPERVIOUS 9X36 MD (GAUZE/BANDAGES/DRESSINGS) ×4 IMPLANT
SUCTION FRAZIER HANDLE 10FR (MISCELLANEOUS) ×2
SUCTION TUBE FRAZIER 10FR DISP (MISCELLANEOUS) ×2 IMPLANT
SUT ETHILON 4 0 FS 1 (SUTURE) IMPLANT
SUT ETHILON 4 0 PS 2 18 (SUTURE) IMPLANT
SUT VIC AB 0 CT1 27 (SUTURE)
SUT VIC AB 0 CT1 27XBRD ANBCTR (SUTURE) IMPLANT
SUT VIC AB 2-0 CT1 27 (SUTURE)
SUT VIC AB 2-0 CT1 TAPERPNT 27 (SUTURE) IMPLANT
SWAB CULTURE ESWAB REG 1ML (MISCELLANEOUS) ×4 IMPLANT
TOWEL OR 17X24 6PK STRL BLUE (TOWEL DISPOSABLE) ×4 IMPLANT
TOWEL OR 17X26 10 PK STRL BLUE (TOWEL DISPOSABLE) ×4 IMPLANT
TUBE CONNECTING 12'X1/4 (SUCTIONS) ×1
TUBE CONNECTING 12X1/4 (SUCTIONS) ×3 IMPLANT
UNDERPAD 30X30 (UNDERPADS AND DIAPERS) ×4 IMPLANT
WATER STERILE IRR 1000ML POUR (IV SOLUTION) IMPLANT
YANKAUER SUCT BULB TIP NO VENT (SUCTIONS) ×4 IMPLANT

## 2017-01-25 NOTE — Op Note (Signed)
01/25/2017  5:09 PM  PATIENT:  Dustin [name]  51 y.o. male  MRN: [id number]  OPERATIVE REPORT  PRE-OPERATIVE DIAGNOSIS:  right knee septic arthritis, retained IM nail right femur  POST-OPERATIVE DIAGNOSIS:  right knee septic arthritis, retained hardware right femur  PROCEDURE:  Procedure(s): REMOVALOF RIGHT FEMUR INTRAMEDULLARY NAIL AND DISTAL INTERLOCKING SCREW, IRRIGATION AND DEBRIDEMENT RIGHT KNEE    SURGEON:  Jessy Oto, MD     ASSISTANT:  Benjiman Core, PA-C  (Present throughout the entire procedure and necessary for completion of procedure in a timely manner)      ANESTHESIA:  General,    COMPLICATIONS:  None.   DRAINS: Large Hemovac drain right knee, 10 French to close vacuum suction canister.    COMPONENTS: Removal of right femur IM nail, [place] with single distal interlocking screw.  SPECIMENS: 2 sets of anaerobic and aerobic culture and sensitivity obtain one from superficial right knee and synovial fluid and one from right deep medullary nail and tissue from the intramedullary canal.   PROCEDURE: The patient was met in the holding area, and the appropriate right femur identified and marked with an "X" and my initials.   The patient was then transported to OR and was placed on the operative table in a supine position. The patient was then placed under general anesthesia without difficulty. The patient did not receive preoperative antibiotic prophylaxis to allow for intraoperative cultures.  Tourniquet was applied to the operativeright thigh. Leg was then prepped using sterile conditions and draped using sterile technique. Time-out procedure was called and correct.   The right knee placed over a triangle and incision made in the old incision scar along the medial parapatellar and patella ligament using 10 blade scalpel through skin and subcutaneous layers and external retinaculum of the knee along the medial patella ligament through the pre-knee fat  pad entering the knee joint. Immediate clear synovial fluid was noted and cultures were obtained both anaerobic and aerobic. The knee flexed further and the area of ligamentum mucosum and retro-patella ligament deep areas of fat pad were debrided to allow for exposure of the IM nail entry point in the intercondylar notch. This was then debrided of soft tissue overlying the entry point after first using a C-arm for identification of the entry point for the nail. The C-arm was draped sterilely and the C-arm brought into the field and right leg examined in both the AP and with the right knee externally rotated to view the lateral view of the entry point of the right femur IM nail and the anterior posterior position of the distal tip of the distal IM nail. Soft tissue overlying the distal nail was then every to be debrided using curettes bear ronguer as well as a Bovie electrocautery. The distal IM nail cap was present and the cap screwdriver was used to remove the distal cap within the end of the distal IM nail. The threaded IM nail extractor was then able to be the threaded onto the end of the intramedullary nail. With this fully seated, incision was made over the lateral aspect of the right distal lateral femur in the old previous incision scar for the placement of the distal interlocking screws. After using C-arm to identify the entry position for the incision. Approximately 1-1/2 of centimeter incision was made through the old incision scar. Through skin and subcutaneous layers down to bone. Ultimately visualizing the retained distal interlocking screw head retracted using sym retractors. Bone examined and then using  a Soil scientist expposed the head of the distal interlocking screw. It  was able to be exposed after debrided of soft tissue within the's opening on the end of the screw. Screwdriver was then placed over this and then used to extract the screw without difficulty a tonsil clamp used to provide some  traction on the screw with this resection. It was then performed. The nail was then able to be extracted using the SLAP hammer after attaching the threaded rod for the SLAP hammer to the previously attach threated extraction interface. The IM Nail was removed. With removal of the nail,the end of the proximal portion of the nail showed a significant purulent exudate and  drainage and deeper cultures were then obtained from this material from the deep proximal portions of the nail. These were again sent for anaerobic and aerobic culture and sensitivity soft tissue also removed from the most proximal portion of the nail was sent for culture and sensitivity. Irrigation was performed of the right knee joint and then a bottle brush then placed on the pulsatile lavage system and the intramedullary canal was then bottle brushed to the depths of the bottle brush in order to remove any glycocalyx material and infected soft tissue from within the intramedullary canal. Following this then the guidepin was placed down the intramedullary canal with the knee flexed and reaming was performed from the 10 mm to 19m, 158mand 14 mm allowing for debridement of the intramedullary canal and any glycocalyx and intramedullary the debris was able to be removed. Suction was able to be passed up the intramedullary canal and the final irrigation was performed of the intramedullary canal using double antibiotic solution. The joint itself was irrigated with copious amounts of irrigant solution again though antibiotic solution passed into the knee joint. This point and there is no sign of any purulence within the knee joint or the intramedullary canal arts open disability. A #10 French Hemovac drain was able very pleasant passed through the knee joint out through the subcutaneous layers and skin over the anterior lateral aspect of the distal thigh of the superior and lateral pole patella. This was then cut and the length to allow for drainage of  the knee joint. He not sewn in place. Incisions were then entered then closed by approximating the deep subcutaneous layers with interrupted 0 Vicryl sutures both the lateral aspect of the distal thigh and over the anterior medial para patella ligament incision site subcutaneous layers then approximated with interrupted 2-0 Vicryl sutures and skin closed with stainless steel staples same was done over the lateral incision for the removal of the distal interlocking screw. The areas were then cleaned and then Adaptic 4 x 4's ABDs pads fixed to the skin with sterile web roll and Ace wrap applied from the right foot to the upper thigh. Was taped in place with pink tape. Patient was then reactivated extubated and returned to recovery room in satisfactory condition all instruments and sponge counts were correct.  JaBenjiman CoreA-C perform the duties of assistant surgeon during this procedure he performed traction on skin edges with the exposure and the assurance I am nail and the removal extraction of the nail. As well as some exposure to allow for removal of the distal interlocking screw laterally. Without his assistance of this procedure could not be carried out effectively and efficiently. He assisted in the closure of the incisions anteriorly and laterally over the right knee.      JaBasil Dess  01/25/2017, 5:09 PM

## 2017-01-25 NOTE — Brief Op Note (Signed)
01/25/2017  4:10 PM  PATIENT:  Dustin [name]  51 y.o. male  PRE-OPERATIVE DIAGNOSIS:  right knee septic arthritis, retained IM nail right femur  POST-OPERATIVE DIAGNOSIS:  right knee septic arthritis, retained hardware right femur  PROCEDURE:  Procedure(s): REMOVALOF RIGHT FEMUR INTRAMEDULLARY NAIL AND DISTAL INTERLOCKING SCREW, IRRIGATION AND DEBRIDEMENT RIGHT KNEE (Right)  SURGEON:  Surgeon(s) and Role:    * Jessy Oto, MD - Primary  PHYSICIAN ASSISTANT: Esaw Grandchild  ANESTHESIA:   general  EBL:  125 mL   BLOOD ADMINISTERED:none  DRAINS: (large hemovac right lateral knee to closed suction vacuum container) Hemovact drain(s) in the right knee with  Suction Open   LOCAL MEDICATIONS USED:  NONE  SPECIMEN:  Source of Specimen:  2 sets of cultures, first with entry into the right knee joint synovial fluid and the second deep culture deep portion of the right femur IM nail and tissue from the IM canal. For C&S anaerobic and aerobic.  DISPOSITION OF SPECIMEN:  Microbiology, gram stain and C&S.  COUNTS:  YES  TOURNIQUET:  * No tourniquets in log *  DICTATION: .Dragon Dictation  PLAN OF CARE: Admit to inpatient   PATIENT DISPOSITION:  PACU - hemodynamically stable.   Delay start of Pharmacological VTE agent (>24hrs) due to surgical blood loss or risk of bleeding: no

## 2017-01-25 NOTE — H&P (Signed)
Dustin [name] is an 51 y.o. male.   Chief Complaint: right leg pain HPI: patient with hx of painful retained hardware right LE and above complaint presents for surgical intervention.  Failed conservative treatment.  Past Medical History:  Diagnosis Date  . Arthritis    "right knee, hips; lower back"  (11/09/2016)  . Chronic lower back pain   . Dyspnea    with exertion  . Hypertension   . Obesity   . PTSD (post-traumatic stress disorder)     Past Surgical History:  Procedure Laterality Date  . ANTERIOR CERVICAL DECOMP/DISCECTOMY FUSION  2007   C 5, 6 ,7  . BACK SURGERY    . CLOSED REDUCTION WRIST FRACTURE Left 04/01/2015   Procedure: CLOSED REDUCTION WRIST AND CASTING REPAIR LACERATION TO FINGERS TIMES 2;  Surgeon: Dorna Leitz, MD;  Location: [place];  Service: Orthopedics;  Laterality: Left;  . FEMUR IM NAIL Right 04/01/2015   Procedure: INTRAMEDULLARY (IM) RETROGRADE FEMORAL NAILING ;  Surgeon: Dorna Leitz, MD;  Location: [place];  Service: Orthopedics;  Laterality: Right;  . FRACTURE SURGERY    . HARDWARE REMOVAL Right 10/09/2016   Procedure: Dynamization of Nail Right  femur, removal of Screws and Excision of hematoma cavity right thigh;  Surgeon: Jessy Oto, MD;  Location: [place];  Service: Orthopedics;  Laterality: Right;  . HEMATOMA EVACUATION Right 10/09/2016   Procedure: EVACUATION HEMATOMA;  Surgeon: Jessy Oto, MD;  Location: [place];  Service: Orthopedics;  Laterality: Right;  . KNEE ARTHROSCOPY Right 11/09/2016   I&D  . KNEE ARTHROSCOPY Right 11/09/2016   Procedure: ARTHROSCOPIC IRRIGATION AND DEBRIDEMENT RIGHT KNEE;  Surgeon: Jessy Oto, MD;  Location: [place];  Service: Orthopedics;  Laterality: Right;  . KNEE ARTHROSCOPY W/ ACL RECONSTRUCTION Right 2001  . LABRAL REPAIR Right 06/2005  . RESECTION DISTAL CLAVICAL Right 2009    Family History  Problem Relation Age of Onset  . Colon cancer Neg Hx    Social History:  reports that  has never smoked. he has never used  smokeless tobacco. He reports that he drinks alcohol. He reports that he does not use drugs.  Allergies:  Allergies  Allergen Reactions  . Latex Rash and Swelling  . Mobic [Meloxicam] Nausea Only    No medications prior to admission.    No results found for this or any previous visit (from the past 48 hour(s)). No results found.  Review of Systems  Constitutional: Negative.   HENT: Negative.   Respiratory: Negative.   Cardiovascular: Negative.   Genitourinary: Negative.   Musculoskeletal: Positive for joint pain.  Neurological: Negative.     There were no vitals taken for this visit. Physical Exam  Constitutional: He is oriented to person, place, and time. No distress.  HENT:  Head: Normocephalic and atraumatic.  Eyes: Pupils are equal, round, and reactive to light.  Neck: Normal range of motion.  Respiratory: No respiratory distress.  GI: He exhibits no distension.  Musculoskeletal: He exhibits tenderness.  Neurological: He is alert and oriented to person, place, and time.  Skin: Skin is warm and dry.     Assessment/Plan Right leg pain and painful retained hardware   We will proceed with REMOVALOF RIGHT FEMUR IM NAIL AND DISTAL INTERLOCKING SCREW, IRRIGATION AND DEBRIDEMENT RIGHT KNEE I&D as scheduled.  Surgical procedure along with possible risks and complications discussed  All questions answered and wishes to proceed.   Benjiman Core, PA-C 01/25/2017, 9:07 AM

## 2017-01-25 NOTE — Transfer of Care (Signed)
Immediate Anesthesia Transfer of Care Note  Patient: Dustin [name]  Procedure(s) Performed: REMOVALOF RIGHT FEMUR INTRAMEDULLARY NAIL AND DISTAL INTERLOCKING SCREW, IRRIGATION AND DEBRIDEMENT RIGHT KNEE (Right Knee)  Patient Location: PACU  Anesthesia Type:General  Level of Consciousness: drowsy and patient cooperative  Airway & Oxygen Therapy: Patient Spontanous Breathing and Patient connected to nasal cannula oxygen  Post-op Assessment: Report given to RN, Post -op Vital signs reviewed and stable and Patient moving all extremities  Post vital signs: Reviewed and stable  Last Vitals:  Vitals:   01/25/17 1053  BP: (!) 171/105  Pulse: 90  Resp: 18  Temp: 36.6 C  SpO2: 99%    Last Pain:  Vitals:   01/25/17 1053  TempSrc: Oral      Patients Stated Pain Goal: 3 (52/77/82 4235)  Complications: No apparent anesthesia complications

## 2017-01-25 NOTE — Interval H&P Note (Signed)
History and Physical Interval Note:  01/25/2017 1:28 PM  Dustin [name]  has presented today for surgery, with the diagnosis of right knee septic arthritis, retained IM nail right femur  The various methods of treatment have been discussed with the patient and family. After consideration of risks, benefits and other options for treatment, the patient has consented to  Procedure(s): REMOVALOF RIGHT FEMUR IM NAIL AND DISTAL INTERLOCKING SCREW, IRRIGATION AND DEBRIDEMENT RIGHT KNEE (Right) IRRIGATION AND DEBRIDEMENT EXTREMITY (Right) as a surgical intervention .  The patient's history has been reviewed, patient examined, no change in status, stable for surgery.  I have reviewed the patient's chart and labs.  Questions were answered to the patient's satisfaction.     [name]

## 2017-01-25 NOTE — Anesthesia Preprocedure Evaluation (Addendum)
Anesthesia Evaluation  Patient identified by MRN, date of birth, ID band Patient awake    Reviewed: Allergy & Precautions, NPO status , Patient's Chart, lab work & pertinent test results  History of Anesthesia Complications Negative for: history of anesthetic complications  Airway Mallampati: I  TM Distance: >3 FB Neck ROM: Full    Dental  (+) Teeth Intact, Dental Advisory Given   Pulmonary shortness of breath,    breath sounds clear to auscultation       Cardiovascular hypertension,  Rhythm:Regular Rate:Normal     Neuro/Psych    GI/Hepatic negative GI ROS,   Endo/Other  negative endocrine ROSMorbid obesity  Renal/GU negative Renal ROS     Musculoskeletal  (+) Arthritis ,   Abdominal (+) + obese,   Peds  Hematology negative hematology ROS (+)   Anesthesia Other Findings   Reproductive/Obstetrics                           Anesthesia Physical Anesthesia Plan  ASA: II  Anesthesia Plan: General   Post-op Pain Management:    Induction: Intravenous  PONV Risk Score and Plan: 3 and Treatment may vary due to age or medical condition, Dexamethasone and Ondansetron  Airway Management Planned: Oral ETT  Additional Equipment:   Intra-op Plan:   Post-operative Plan: Extubation in OR  Informed Consent: I have reviewed the patients History and Physical, chart, labs and discussed the procedure including the risks, benefits and alternatives for the proposed anesthesia with the patient or authorized representative who has indicated his/her understanding and acceptance.   Dental advisory given  Plan Discussed with: CRNA  Anesthesia Plan Comments:         Anesthesia Quick Evaluation

## 2017-01-25 NOTE — Anesthesia Procedure Notes (Addendum)
Procedure Name: Intubation Date/Time: 01/25/2017 2:14 PM Performed by: Catalina Gravel, MD Pre-anesthesia Checklist: Patient identified, Emergency Drugs available, Suction available and Patient being monitored Patient Re-evaluated:Patient Re-evaluated prior to induction Oxygen Delivery Method: Circle system utilized Preoxygenation: Pre-oxygenation with 100% oxygen Induction Type: IV induction Ventilation: Mask ventilation without difficulty and Oral airway inserted - appropriate to patient size Laryngoscope Size: 4 and Glidescope Grade View: Grade II Tube type: Oral Tube size: 7.5 mm Number of attempts: 3 Airway Equipment and Method: Stylet and Video-laryngoscopy Placement Confirmation: ETT inserted through vocal cords under direct vision,  positive ETCO2 and breath sounds checked- equal and bilateral Secured at: 22 cm Tube secured with: Tape Dental Injury: Teeth and Oropharynx as per pre-operative assessment  Difficulty Due To: Difficulty was anticipated Future Recommendations: Recommend- induction with short-acting agent, and alternative techniques readily available Comments: Attempt x1 by CRNA with Grade 4 view with Mil 2 blade.  Attempt x1 by MDA with Grade 4 view with Mac 4.  Glidescope with #4 blade with good video visualization of glottis, ETT passed easily through cords.  +BBS, +EtCO2.

## 2017-01-25 NOTE — Discharge Instructions (Addendum)
° ° °  Keep knee incisions and dressing dry for 4 days post op then may wet while bathing and apply bandaids. Call if fever or chills or increased drainage. Go to ER if acutely short of breath or call for ambulance. Return for follow up in 2 weeks. May full weight bear on the surgical leg unless told otherwise.Take full strength aspirin 325 mg with meal or snack twice a day to prevent blood clots. In house walking for first 2 weeks.

## 2017-01-26 LAB — CBC WITH DIFFERENTIAL/PLATELET
BASOS ABS: 0 10*3/uL (ref 0.0–0.1)
BASOS PCT: 0 %
Eosinophils Absolute: 0 10*3/uL (ref 0.0–0.7)
Eosinophils Relative: 0 %
HEMATOCRIT: 40 % (ref 39.0–52.0)
HEMOGLOBIN: 12.8 g/dL — AB (ref 13.0–17.0)
Lymphocytes Relative: 10 %
Lymphs Abs: 1.1 10*3/uL (ref 0.7–4.0)
MCH: 27.4 pg (ref 26.0–34.0)
MCHC: 32 g/dL (ref 30.0–36.0)
MCV: 85.7 fL (ref 78.0–100.0)
Monocytes Absolute: 0.8 10*3/uL (ref 0.1–1.0)
Monocytes Relative: 7 %
NEUTROS ABS: 9.1 10*3/uL — AB (ref 1.7–7.7)
NEUTROS PCT: 83 %
Platelets: 390 10*3/uL (ref 150–400)
RBC: 4.67 MIL/uL (ref 4.22–5.81)
RDW: 14.7 % (ref 11.5–15.5)
WBC: 10.9 10*3/uL — ABNORMAL HIGH (ref 4.0–10.5)

## 2017-01-26 LAB — BASIC METABOLIC PANEL
ANION GAP: 10 (ref 5–15)
BUN: 11 mg/dL (ref 6–20)
CO2: 24 mmol/L (ref 22–32)
Calcium: 8.8 mg/dL — ABNORMAL LOW (ref 8.9–10.3)
Chloride: 101 mmol/L (ref 101–111)
Creatinine, Ser: 0.96 mg/dL (ref 0.61–1.24)
Glucose, Bld: 111 mg/dL — ABNORMAL HIGH (ref 65–99)
POTASSIUM: 4.2 mmol/L (ref 3.5–5.1)
SODIUM: 135 mmol/L (ref 135–145)

## 2017-01-26 LAB — C-REACTIVE PROTEIN: CRP: <0.8 mg/dL (ref <1.0)

## 2017-01-26 LAB — SEDIMENTATION RATE: SED RATE: 18 mm/h — AB (ref 0–16)

## 2017-01-26 MED ORDER — OXYCODONE HCL 5 MG PO TABS
25.0000 mg | ORAL_TABLET | ORAL | Status: DC | PRN
  Administered 2017-01-26 – 2017-01-30 (×30): 25 mg via ORAL
  Filled 2017-01-26 (×30): qty 5

## 2017-01-26 MED ORDER — VANCOMYCIN HCL IN DEXTROSE 1-5 GM/200ML-% IV SOLN
1000.0000 mg | Freq: Two times a day (BID) | INTRAVENOUS | Status: AC
  Administered 2017-01-26: 1000 mg via INTRAVENOUS
  Filled 2017-01-26: qty 200

## 2017-01-26 MED ORDER — VANCOMYCIN HCL IN DEXTROSE 1-5 GM/200ML-% IV SOLN
1000.0000 mg | Freq: Three times a day (TID) | INTRAVENOUS | Status: DC
  Administered 2017-01-26 – 2017-01-30 (×11): 1000 mg via INTRAVENOUS
  Filled 2017-01-26 (×13): qty 200
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
## 2017-01-26 NOTE — Progress Notes (Signed)
Pharmacy Antibiotic Note  Mica Releford is a 51 y.o. male admitted on 01/25/2017 with septic arthritis s/p hardware removal  Plan: Vanc 1 g q8 Monitor renal fx cx vt prn  Height: 6' 1.5" (186.7 cm) Weight: 252 lb (114.3 kg) IBW/kg (Calculated) : 81.05  Temp (24hrs), Avg:98.1 F (36.7 C), Min:97.7 F (36.5 C), Max:98.4 F (36.9 C)  Recent Labs  Lab 01/25/17 1218 01/26/17 0521  WBC 7.3 10.9*  CREATININE 1.00 0.96    Estimated Creatinine Clearance: 118.8 mL/min (by C-G formula based on SCr of 0.96 mg/dL).    Allergies  Allergen Reactions  . Latex Rash and Swelling  . Mobic [Meloxicam] Nausea Only   Levester Fresh, PharmD, BCPS, BCCCP Clinical Pharmacist Clinical phone for 01/26/2017 from 7a-3:30p: [phone] If after 3:30p, please call main pharmacy at: [phone] 01/26/2017 5:50 PM

## 2017-01-26 NOTE — Plan of Care (Signed)
  Progressing Education: Knowledge of General Education information will improve 01/26/2017 1019 - Progressing by Harlin Heys, Luis M. Cintron Behavior/Discharge Planning: Ability to manage health-related needs will improve 01/26/2017 1019 - Progressing by Harlin Heys, RN Clinical Measurements: Ability to maintain clinical measurements within normal limits will improve 01/26/2017 1019 - Progressing by Harlin Heys, RN Will remain free from infection 01/26/2017 1019 - Progressing by Harlin Heys, RN Diagnostic test results will improve 01/26/2017 1019 - Progressing by Harlin Heys, RN Respiratory complications will improve 01/26/2017 1019 - Progressing by Harlin Heys, RN Cardiovascular complication will be avoided 01/26/2017 1019 - Progressing by Harlin Heys, RN Activity: Risk for activity intolerance will decrease 01/26/2017 1019 - Progressing by Harlin Heys, RN Nutrition: Adequate nutrition will be maintained 01/26/2017 1019 - Progressing by Harlin Heys, RN Coping: Level of anxiety will decrease 01/26/2017 1019 - Progressing by Harlin Heys, RN Elimination: Will not experience complications related to bowel motility 01/26/2017 1019 - Progressing by Harlin Heys, RN Will not experience complications related to urinary retention 01/26/2017 1019 - Progressing by Harlin Heys, RN Pain Managment: General experience of comfort will improve 01/26/2017 1019 - Progressing by Harlin Heys, RN Safety: Ability to remain free from injury will improve 01/26/2017 1019 - Progressing by Harlin Heys, RN Skin Integrity: Risk for impaired skin integrity will decrease 01/26/2017 1019 - Progressing by Harlin Heys, RN

## 2017-01-26 NOTE — Progress Notes (Signed)
     Subjective: 1 Day Post-Op Procedure(s) (LRB): REMOVALOF RIGHT FEMUR INTRAMEDULLARY NAIL AND DISTAL INTERLOCKING SCREW, IRRIGATION AND DEBRIDEMENT RIGHT KNEE (Right) Awake, alert and oriented x 4. Thank you for increasing the medications. OxyIR 15 increaased to 25 Q 3 H prn pain and IV morphine 2 mg IV Q one hour.  Patient reports pain as moderate.    Objective:   VITALS:  Temp:  [97.7 F (36.5 C)-98.4 F (36.9 C)] 97.8 F (36.6 C) (12/08 1321) Pulse Rate:  [77-98] 93 (12/08 1321) Resp:  [13-20] 18 (12/08 1321) BP: (132-162)/(84-109) 132/87 (12/08 1321) SpO2:  [92 %-100 %] 92 % (12/08 1321)  Neurologically intact ABD soft Neurovascular intact Sensation intact distally Intact pulses distally Dorsiflexion/Plantar flexion intact Incision: dressing C/D/I Compartment soft  Gram stain of specimen, superficial (synovial fluid) and deep,  Deep end of IM nail: numerous gram positive cocci. LABS Recent Labs    01/25/17 1218 01/26/17 0521  HGB 13.4 12.8*  WBC 7.3 10.9*  PLT 394 390   Recent Labs    01/25/17 1218 01/26/17 0521  NA 138 135  K 3.7 4.2  CL 106 101  CO2 23 24  BUN 13 11  CREATININE 1.00 0.96  GLUCOSE 95 111*   Recent Labs    01/25/17 1218  INR 0.99     Assessment/Plan: 1 Day Post-Op Procedure(s) (LRB): REMOVALOF RIGHT FEMUR INTRAMEDULLARY NAIL AND DISTAL INTERLOCKING SCREW, IRRIGATION AND DEBRIDEMENT RIGHT KNEE (Right)  Advance diet Up with therapy Continue ABX therapy due to Post-op infection  Dr. Graylon Good ID consulted for help with antibiotic selection and  Treatment of smoldering osteomyelitis and recurrent pyarthrosis now post removal of retained right femur IM Nail and distal interlocking screw.  [name] 01/26/2017, 2:14 PMPatient ID: Dustin [name], male   DOB: 11/03/1965, 51 y.o.   MRN: [id number]

## 2017-01-27 LAB — VANCOMYCIN, TROUGH: VANCOMYCIN TR: 19 ug/mL (ref 15–20)

## 2017-01-27 NOTE — Progress Notes (Signed)
Pharmacy Antibiotic Note  Dustin [name] is a 51 y.o. male admitted on 01/25/2017 with septic arthritis s/p hardware removal  vt - 19  Plan: Continue Vanc 1 g q8 Monitor renal fx cx vt prn  Height: 6' 1.5" (186.7 cm) Weight: 252 lb (114.3 kg) IBW/kg (Calculated) : 81.05  Temp (24hrs), Avg:97.9 F (36.6 C), Min:97.6 F (36.4 C), Max:98 F (36.7 C)  Recent Labs  Lab 01/25/17 1218 01/26/17 0521 01/27/17 1415  WBC 7.3 10.9*  --   CREATININE 1.00 0.96  --   VANCOTROUGH  --   --  19    Estimated Creatinine Clearance: 118.8 mL/min (by C-G formula based on SCr of 0.96 mg/dL).    Allergies  Allergen Reactions  . Latex Rash and Swelling  . Mobic [Meloxicam] Nausea Only   Levester Fresh, PharmD, BCPS, BCCCP Clinical Pharmacist Clinical phone for 01/27/2017 from 7a-3:30p: [phone] If after 3:30p, please call main pharmacy at: [phone] 01/27/2017 3:46 PM

## 2017-01-27 NOTE — Anesthesia Postprocedure Evaluation (Signed)
Anesthesia Post Note  Patient: Dustin [name]  Procedure(s) Performed: REMOVALOF RIGHT FEMUR INTRAMEDULLARY NAIL AND DISTAL INTERLOCKING SCREW, IRRIGATION AND DEBRIDEMENT RIGHT KNEE (Right Knee)     Patient location during evaluation: PACU Anesthesia Type: General Level of consciousness: awake and alert Pain management: pain level controlled Vital Signs Assessment: post-procedure vital signs reviewed and stable Respiratory status: spontaneous breathing, nonlabored ventilation, respiratory function stable and patient connected to nasal cannula oxygen Cardiovascular status: blood pressure returned to baseline and stable Postop Assessment: no apparent nausea or vomiting Anesthetic complications: no    Last Vitals:  Vitals:   01/27/17 0528 01/27/17 1300  BP: 106/76 (!) 105/50  Pulse: 100 91  Resp:  18  Temp: 36.7 C 36.4 C  SpO2: 98% 95%    Last Pain:  Vitals:   01/27/17 1503  TempSrc:   PainSc: 3                  Catalina Gravel

## 2017-01-28 ENCOUNTER — Encounter (HOSPITAL_COMMUNITY): Payer: Self-pay | Admitting: Specialist

## 2017-01-28 DIAGNOSIS — M00061 Staphylococcal arthritis, right knee: Secondary | ICD-10-CM

## 2017-01-28 NOTE — Progress Notes (Signed)
     Subjective: 3 Days Post-Op Procedure(s) (LRB): REMOVALOF RIGHT FEMUR INTRAMEDULLARY NAIL AND DISTAL INTERLOCKING SCREW, IRRIGATION AND DEBRIDEMENT RIGHT KNEE (Right) Awake, alert and oriented x 4. Right knee is painful. Hemovac drain with clear drainage in small amounts.   Patient reports pain as moderate.    Objective:   VITALS:  Temp:  [97.2 F (36.2 C)-98.1 F (36.7 C)] 98.1 F (36.7 C) (12/10 0600) Pulse Rate:  [91-98] 96 (12/10 0600) Resp:  [16-18] 18 (12/10 0600) BP: (105-143)/(50-81) 143/81 (12/09 2230) SpO2:  [95 %-100 %] 100 % (12/10 0600)  Neurologically intact ABD soft Neurovascular intact Sensation intact distally Intact pulses distally Dorsiflexion/Plantar flexion intact Incision: no drainage and Hemovac discontinued. No cellulitis present   LABS Recent Labs    01/25/17 1218 01/26/17 0521  HGB 13.4 12.8*  WBC 7.3 10.9*  PLT 394 390   Recent Labs    01/25/17 1218 01/26/17 0521  NA 138 135  K 3.7 4.2  CL 106 101  CO2 23 24  BUN 13 11  CREATININE 1.00 0.96  GLUCOSE 95 111*   Recent Labs    01/25/17 1218  INR 0.99  Cultures positive for Staph species, lugdunensis, oxacillin resistant, otherwise sensitive to most antibiotics, septra but  Unfortunately high level <=10 sensitive   Assessment/Plan: 3 Days Post-Op Procedure(s) (LRB): REMOVALOF RIGHT FEMUR INTRAMEDULLARY NAIL AND DISTAL INTERLOCKING SCREW, IRRIGATION AND DEBRIDEMENT RIGHT KNEE (Right)  Advance diet Up with therapy  ID assisting with medication selection, currently on vancomycin and plan to change to oral antibiotics.May be ready for discharge Wed. 12/12  [name] 01/28/2017, 11:56 AMPatient ID: Dustin [name], male   DOB: September 15, 1965, 51 y.o.   MRN: [id number]

## 2017-01-28 NOTE — Progress Notes (Signed)
INFECTIOUS DISEASE PROGRESS NOTE  ID: Dustin [name] is a 51 y.o. male with  Active Problems:   Septic arthritis of knee, right (HCC)   S/P hardware removal  Subjective: No complaints  Abtx:  Anti-infectives (From admission, onward)   Start     Dose/Rate Route Frequency Ordered Stop   01/26/17 2200  vancomycin (VANCOCIN) IVPB 1000 mg/200 mL premix     1,000 mg 200 mL/hr over 60 Minutes Intravenous Every 8 hours 01/26/17 1749     01/26/17 1500  vancomycin (VANCOCIN) IVPB 1000 mg/200 mL premix     1,000 mg 200 mL/hr over 60 Minutes Intravenous Every 12 hours 01/26/17 1421 01/26/17 1602   01/25/17 2200  sulfamethoxazole-trimethoprim (BACTRIM DS,SEPTRA DS) 800-160 MG per tablet 1 tablet  Status:  Discontinued    Comments:  Take one tablet po every 12 hours.     1 tablet Oral 2 times daily 01/25/17 1738 01/26/17 1746   01/25/17 1900  vancomycin (VANCOCIN) IVPB 1000 mg/200 mL premix     1,000 mg 200 mL/hr over 60 Minutes Intravenous Every 12 hours 01/25/17 1738 01/25/17 2100   01/25/17 1545  polymyxin B 500,000 Units, bacitracin 50,000 Units in sodium chloride 0.9 % 500 mL irrigation  Status:  Discontinued       As needed 01/25/17 1545 01/25/17 1608      Medications:  Scheduled: . aspirin  325 mg Oral BID PC  . docusate sodium  100 mg Oral BID  . ferrous sulfate  325 mg Oral TID PC  . lisinopril  20 mg Oral Daily   And  . hydrochlorothiazide  25 mg Oral Daily  . morphine  30 mg Oral Q12H    Objective: Vital signs in last 24 hours: Temp:  [97.2 F (36.2 C)-98.1 F (36.7 C)] 98.1 F (36.7 C) (12/10 0600) Pulse Rate:  [96-98] 96 (12/10 0600) Resp:  [16-18] 18 (12/10 0600) BP: (143)/(81) 143/81 (12/09 2230) SpO2:  [100 %] 100 % (12/10 0600)   General appearance: alert, cooperative and no distress Extremities: R knee wrapped.   Lab Results Recent Labs    01/26/17 0521  WBC 10.9*  HGB 12.8*  HCT 40.0  NA 135  K 4.2  CL 101  CO2 24  BUN 11  CREATININE 0.96    Liver Panel No results for input(s): PROT, ALBUMIN, AST, ALT, ALKPHOS, BILITOT, BILIDIR, IBILI in the last 72 hours. Sedimentation Rate Recent Labs    01/26/17 0521  ESRSEDRATE 18*   C-Reactive Protein Recent Labs    01/26/17 0521  CRP <0.8    Microbiology: Recent Results (from the past 240 hour(s))  Aerobic/Anaerobic Culture (surgical/deep wound)     Status: None (Preliminary result)   Collection Time: 01/25/17  3:23 PM  Result Value Ref Range Status   Specimen Description WOUND  Final   Special Requests PURULENT DISCHARGE FROM JM NAIL  Final   Gram Stain   Final    FEW WBC PRESENT, PREDOMINANTLY PMN MODERATE GRAM POSITIVE COCCI    Culture   Final    FEW STAPHYLOCOCCUS LUGDUNENSIS NO ANAEROBES ISOLATED; CULTURE IN PROGRESS FOR 5 DAYS    Report Status PENDING  Incomplete   Organism ID, Bacteria STAPHYLOCOCCUS LUGDUNENSIS  Final      Susceptibility   Staphylococcus lugdunensis - MIC*    CIPROFLOXACIN <=0.5 SENSITIVE Sensitive     ERYTHROMYCIN <=0.25 SENSITIVE Sensitive     GENTAMICIN <=0.5 SENSITIVE Sensitive     OXACILLIN >=4 RESISTANT Resistant  TETRACYCLINE <=1 SENSITIVE Sensitive     VANCOMYCIN <=0.5 SENSITIVE Sensitive     TRIMETH/SULFA <=10 SENSITIVE Sensitive     CLINDAMYCIN <=0.25 SENSITIVE Sensitive     RIFAMPIN <=0.5 SENSITIVE Sensitive     Inducible Clindamycin NEGATIVE Sensitive     * FEW STAPHYLOCOCCUS LUGDUNENSIS  Aerobic/Anaerobic Culture (surgical/deep wound)     Status: None (Preliminary result)   Collection Time: 01/25/17  3:23 PM  Result Value Ref Range Status   Specimen Description WOUND  Final   Special Requests SOFT TISSUE OVERLAYING IM NAIL  Final   Gram Stain   Final    RARE WBC PRESENT, PREDOMINANTLY MONONUCLEAR NO ORGANISMS SEEN    Culture   Final    NO GROWTH 3 DAYS NO ANAEROBES ISOLATED; CULTURE IN PROGRESS FOR 5 DAYS   Report Status PENDING  Incomplete  MRSA PCR Screening     Status: None   Collection Time: 01/25/17  6:30 PM   Result Value Ref Range Status   MRSA by PCR NEGATIVE NEGATIVE Final    Comment:        The GeneXpert MRSA Assay (FDA approved for NASAL specimens only), is one component of a comprehensive MRSA colonization surveillance program. It is not intended to diagnose MRSA infection nor to guide or monitor treatment for MRSA infections.     Studies/Results: No results found.   Assessment/Plan: Prosthetic joint infection (resected) Staph lugdenensis  Total days of antibiotics: 3 vanco  Would continue vanco while in hospital Dose of oritavancin on day of d/c Doxy 100mg  po bid for 3 months (at least) F/u in ID clinic  Available as needed.          Bobby Rumpf MD, FACP Infectious Diseases (pager) [phone] [email or web address] 01/28/2017, 4:12 PM  LOS: 3 days

## 2017-01-28 NOTE — Progress Notes (Signed)
Physical Therapy Treatment Patient Details Name: Dustin [name] MRN: [id number] DOB: 1965-10-25 Today's Date: 01/28/2017    History of Present Illness Dustin [name] is a 51 y/o male s/p removal of R femur IM nail on 01/25/17. Patient with a PMHx significant for femur fracture in 2017, HTN, PTSD.    PT Comments    Patient is making very gradual progress toward mobility goals. Pt tolerated ambulating ~2 ft and unable to bear weight on R LE due to pain. Pt would like to use rollator despite recommendation for use of RW as this would be safer choice given pt relies heavily on bilat UE support. Pt's rollator is too short and he will need one that is taller. Continue to progress as tolerated.    Follow Up Recommendations  DC plan and follow up therapy as arranged by surgeon;Home health PT;Supervision for mobility/OOB     Equipment Recommendations  Other (comment)(pt reported he has RW at home; prefers rollator)    Recommendations for Other Services       Precautions / Restrictions Precautions Precautions: Fall Restrictions Weight Bearing Restrictions: Yes RLE Weight Bearing: Weight bearing as tolerated    Mobility  Bed Mobility Overal bed mobility: Modified Independent Bed Mobility: Supine to Sit           General bed mobility comments: increased time and effort  Transfers Overall transfer level: Needs assistance Equipment used: Rolling walker (2 wheeled) Transfers: Sit to/from Stand Sit to Stand: Min guard         General transfer comment: cues for safe hand placement; min guard for safety  Ambulation/Gait Ambulation/Gait assistance: Min guard Ambulation Distance (Feet): 2 Feet Assistive device: Rolling walker (2 wheeled) Gait Pattern/deviations: Step-to pattern;Decreased weight shift to right     General Gait Details: cues for posture, weight bearing status, and safe use of AD; pt maintained NWB to TDWB on R LE due to pain   Stairs            Wheelchair  Mobility    Modified Rankin (Stroke Patients Only)       Balance Overall balance assessment: Needs assistance Sitting-balance support: Bilateral upper extremity supported;Feet supported Sitting balance-[name]: Fair       Standing balance-[name]: Poor                              Cognition Arousal/Alertness: Awake/alert Behavior During Therapy: WFL for tasks assessed/performed Overall Cognitive Status: Within Functional Limits for tasks assessed                                        Exercises General Exercises - Lower Extremity Ankle Circles/Pumps: AROM;Right;10 reps Quad Sets: AROM;Right;10 reps Heel Slides: AAROM;Right;5 reps    General Comments        Pertinent Vitals/Pain Pain Assessment: Faces Faces Pain Scale: Hurts even more Pain Location: R knee Pain Descriptors / Indicators: Grimacing;Guarding;Sore;Burning Pain Intervention(s): Limited activity within patient's tolerance;Monitored during session;Premedicated before session;Repositioned    Home Living                      Prior Function            PT Goals (current goals can now be found in the care plan section) Acute Rehab PT Goals PT Goal Formulation: With patient Time For Goal Achievement: 02/09/17 Potential  to Achieve Goals: Good Progress towards PT goals: Progressing toward goals    Frequency    Min 5X/week      PT Plan Current plan remains appropriate    Co-evaluation              AM-PAC PT "6 Clicks" Daily Activity  Outcome Measure  Difficulty turning over in bed (including adjusting bedclothes, sheets and blankets)?: A Little Difficulty moving from lying on back to sitting on the side of the bed? : A Lot Difficulty sitting down on and standing up from a chair with arms (e.g., wheelchair, bedside commode, etc,.)?: Unable Help needed moving to and from a bed to chair (including a wheelchair)?: A Little Help needed walking in  hospital room?: A Little Help needed climbing 3-5 steps with a railing? : Total 6 Click Score: 13    End of Session Equipment Utilized During Treatment: Gait belt Activity Tolerance: Patient limited by pain Patient left: in chair;with call bell/phone within reach Nurse Communication: Mobility status PT Visit Diagnosis: Unsteadiness on feet (R26.81);Other abnormalities of gait and mobility (R26.89);Muscle weakness (generalized) (M62.81);Difficulty in walking, not elsewhere classified (R26.2)     Time: 4650-3546 PT Time Calculation (min) (ACUTE ONLY): 22 min  Charges:  $Gait Training: 8-22 mins                    G Codes:       Earney Navy, PTA Pager: [phone]     Darliss Cheney 01/28/2017, 9:31 AM

## 2017-01-29 ENCOUNTER — Encounter (HOSPITAL_COMMUNITY): Payer: Self-pay | Admitting: *Deleted

## 2017-01-29 DIAGNOSIS — M86651 Other chronic osteomyelitis, right thigh: Secondary | ICD-10-CM | POA: Insufficient documentation

## 2017-01-29 LAB — BASIC METABOLIC PANEL
Anion gap: 7 (ref 5–15)
BUN: 9 mg/dL (ref 6–20)
CHLORIDE: 103 mmol/L (ref 101–111)
CO2: 26 mmol/L (ref 22–32)
CREATININE: 1.23 mg/dL (ref 0.61–1.24)
Calcium: 8.5 mg/dL — ABNORMAL LOW (ref 8.9–10.3)
GFR calc Af Amer: >60 mL/min (ref >60)
GFR calc non Af Amer: >60 mL/min (ref >60)
GLUCOSE: 112 mg/dL — AB (ref 65–99)
POTASSIUM: 3.9 mmol/L (ref 3.5–5.1)
SODIUM: 136 mmol/L (ref 135–145)

## 2017-01-29 LAB — HEPATITIS C ANTIBODY: HCV Ab: <0.1 s/co ratio (ref 0.0–0.9)

## 2017-01-29 MED ORDER — ORITAVANCIN DIPHOSPHATE 400 MG IV SOLR
1200.0000 mg | Freq: Once | INTRAVENOUS | Status: AC
  Administered 2017-01-30: 1200 mg via INTRAVENOUS
  Filled 2017-01-29 (×2): qty 120

## 2017-01-29 NOTE — Progress Notes (Signed)
Physical Therapy Treatment Patient Details Name: Dustin [name] MRN: [id number] DOB: 1965/10/13 Today's Date: 01/29/2017    History of Present Illness Mr. Wettstein is a 51 y/o male s/p removal of R femur IM nail on 01/25/17. Patient with a PMHx significant for femur fracture in 2017, HTN, PTSD.    PT Comments    Continuing work on functional mobility and activity tolerance;  Much improved progressive ambulation distance; Insisting on using Rollator RW, which, while unconventional, given WBAT status it is not entirely unreasonable   Follow Up Recommendations  DC plan and follow up therapy as arranged by surgeon;Home health PT;Supervision for mobility/OOB     Equipment Recommendations  Other (comment)(pt reported he has RW at home; prefers rollator)    Recommendations for Other Services       Precautions / Restrictions Precautions Precautions: Fall Restrictions RLE Weight Bearing: Weight bearing as tolerated    Mobility  Bed Mobility               General bed mobility comments: EOB upon my arrival  Transfers Overall transfer level: Needs assistance Equipment used: 4-wheeled walker Transfers: Sit to/from Stand Sit to Stand: Min guard         General transfer comment: cues for safe hand placement; min guard for safety  Ambulation/Gait Ambulation/Gait assistance: Min guard;Supervision Ambulation Distance (Feet): 120 Feet Assistive device: 4-wheeled walker Gait Pattern/deviations: Step-to pattern;Decreased weight shift to right     General Gait Details: Minguard assist, progressing to Supervision; extremely small steps, limited by pain and the seat of the [place]; We briefly dicsussed that steps might be smoother with a conventional RW -- Mr. Batterton insists on the Rollator, and since he is WBAT on the RLE, I didn't push it   Stairs            Wheelchair Mobility    Modified Rankin (Stroke Patients Only)       Balance     Sitting balance-Leahy  Scale: Fair       Standing balance-[name]: Poor                              Cognition Arousal/Alertness: Awake/alert Behavior During Therapy: WFL for tasks assessed/performed Overall Cognitive Status: Within Functional Limits for tasks assessed                                        Exercises      General Comments        Pertinent Vitals/Pain Pain Assessment: Faces Faces Pain Scale: Hurts even more Pain Location: R knee Pain Descriptors / Indicators: Grimacing;Guarding;Sore;Burning Pain Intervention(s): Monitored during session;Premedicated before session;RN gave pain meds during session    Home Living                      Prior Function            PT Goals (current goals can now be found in the care plan section) Acute Rehab PT Goals Patient Stated Goal: return home, walk PT Goal Formulation: With patient Time For Goal Achievement: 02/09/17 Potential to Achieve Goals: Good Progress towards PT goals: Progressing toward goals    Frequency    Min 5X/week      PT Plan Current plan remains appropriate    Co-evaluation  AM-PAC PT "6 Clicks" Daily Activity  Outcome Measure  Difficulty turning over in bed (including adjusting bedclothes, sheets and blankets)?: A Little Difficulty moving from lying on back to sitting on the side of the bed? : A Little Difficulty sitting down on and standing up from a chair with arms (e.g., wheelchair, bedside commode, etc,.)?: A Little Help needed moving to and from a bed to chair (including a wheelchair)?: A Little Help needed walking in hospital room?: A Little Help needed climbing 3-5 steps with a railing? : A Lot 6 Click Score: 17    End of Session Equipment Utilized During Treatment: Gait belt Activity Tolerance: Patient tolerated treatment well Patient left: in chair;with call bell/phone within reach Nurse Communication: Mobility status PT Visit Diagnosis:  Unsteadiness on feet (R26.81);Other abnormalities of gait and mobility (R26.89);Muscle weakness (generalized) (M62.81);Difficulty in walking, not elsewhere classified (R26.2)     Time: 9987-2158 PT Time Calculation (min) (ACUTE ONLY): 18 min  Charges:  $Gait Training: 8-22 mins                    G Codes:       Roney Marion, PT  Acute Rehabilitation Services Pager [phone] Office [place] 01/29/2017, 12:58 PM

## 2017-01-29 NOTE — Care Management Note (Signed)
Case Management Note  Patient Details  Name: Lamere Lightner MRN: [id number] Date of Birth: 1965/05/25  Subjective/Objective:  51 yr old male s/p removal of femur IM Nailing hardware, s/p I & D.                  Action/Plan: Case manager spoke with patient concerning discharge plan and DME. Patient says he has used [facility] and wants to use them again. Case manager called referral to Christa See, [facility]. Patient has DME at home.   Expected Discharge Date:   01/29/17               Expected Discharge Plan:  [place]  In-House Referral:  NA  Discharge planning Services  CM Consult  Post Acute Care Choice:  Home Health Choice offered to:  Patient  DME Arranged:  N/A(has rollator, 3in1) DME Agency:  NA  HH Arranged:  PT [place] Agency:  [facility] (formerly [employer])  Status of Service:  Completed, signed off  If discussed at [employer], dates discussed:    Additional Comments:  Ninfa Meeker, RN 01/29/2017, 11:02 AM

## 2017-01-29 NOTE — Progress Notes (Signed)
     Subjective: 4 Days Post-Op Procedure(s) (LRB): REMOVALOF RIGHT FEMUR INTRAMEDULLARY NAIL AND DISTAL INTERLOCKING SCREW, IRRIGATION AND DEBRIDEMENT RIGHT KNEE (Right) Awake, alert and oriented x 4. Appreciate Dr. Shana Chute input, not sure of the dose for the Oritavancin to be given tomorrow at discharge. Continue IV vancomycin today and discontinue at discharge tomorrow AM. Doxycycline 100  Mg BID as per Dr. Johnnye Sima for at least 3 months. Will check final set of lab prior to to discharge in AM. Seen by Case management.   Patient reports pain as moderate.    Objective:   VITALS:  Temp:  [97.9 F (36.6 C)-98.1 F (36.7 C)] 98.1 F (36.7 C) (12/11 0355) Pulse Rate:  [94-97] 94 (12/11 0355) Resp:  [18] 18 (12/11 0355) BP: (133-147)/(72-86) 142/72 (12/11 0355) SpO2:  [98 %-100 %] 99 % (12/11 0355)  Neurologically intact ABD soft Neurovascular intact Sensation intact distally Intact pulses distally Dorsiflexion/Plantar flexion intact Incision: dressing C/D/I and no drainage No cellulitis present Compartment soft   LABS No results for input(s): HGB, WBC, PLT in the last 72 hours. No results for input(s): NA, K, CL, CO2, BUN, CREATININE, GLUCOSE in the last 72 hours. No results for input(s): LABPT, INR in the last 72 hours.   Assessment/Plan: 4 Days Post-Op Procedure(s) (LRB): REMOVALOF RIGHT FEMUR INTRAMEDULLARY NAIL AND DISTAL INTERLOCKING SCREW, IRRIGATION AND DEBRIDEMENT RIGHT KNEE (Right)  Advance diet Up with therapy Continue ABX therapy due to Post-op infection  Plan to discharge tomorrow AM.  Give Oritavancin tomorrow. Vancomycin up until discharge. Doxycycline 100 mg po BID post Hospitalization.   [name] 01/29/2017, 11:48 AM Patient ID: Rivka Safer, male   DOB: 05/01/1965, 51 y.o.   MRN: [id number]

## 2017-01-30 LAB — COMPREHENSIVE METABOLIC PANEL
ALBUMIN: 3.7 g/dL (ref 3.5–5.0)
ALK PHOS: 85 U/L (ref 38–126)
ALT: 12 U/L — AB (ref 17–63)
ANION GAP: 11 (ref 5–15)
AST: 17 U/L (ref 15–41)
BILIRUBIN TOTAL: 1.3 mg/dL — AB (ref 0.3–1.2)
BUN: 8 mg/dL (ref 6–20)
CALCIUM: 9.2 mg/dL (ref 8.9–10.3)
CO2: 24 mmol/L (ref 22–32)
CREATININE: 1.26 mg/dL — AB (ref 0.61–1.24)
Chloride: 101 mmol/L (ref 101–111)
GFR calc non Af Amer: >60 mL/min (ref >60)
GLUCOSE: 99 mg/dL (ref 65–99)
Potassium: 4.1 mmol/L (ref 3.5–5.1)
Sodium: 136 mmol/L (ref 135–145)
TOTAL PROTEIN: 7.1 g/dL (ref 6.5–8.1)

## 2017-01-30 LAB — CBC WITH DIFFERENTIAL/PLATELET
BLASTS: 0 %
Band Neutrophils: 0 %
Basophils Absolute: 0 10*3/uL (ref 0.0–0.1)
Basophils Relative: 0 %
Eosinophils Absolute: 0.1 10*3/uL (ref 0.0–0.7)
Eosinophils Relative: 1 %
HEMATOCRIT: 37.6 % — AB (ref 39.0–52.0)
HEMOGLOBIN: 12.5 g/dL — AB (ref 13.0–17.0)
LYMPHS PCT: 25 %
Lymphs Abs: 2.5 10*3/uL (ref 0.7–4.0)
MCH: 28.7 pg (ref 26.0–34.0)
MCHC: 33.2 g/dL (ref 30.0–36.0)
MCV: 86.4 fL (ref 78.0–100.0)
Metamyelocytes Relative: 0 %
Monocytes Absolute: 1.1 10*3/uL — ABNORMAL HIGH (ref 0.1–1.0)
Monocytes Relative: 11 %
Myelocytes: 0 %
NEUTROS PCT: 63 %
NRBC: 0 /100{WBCs}
Neutro Abs: 6.4 10*3/uL (ref 1.7–7.7)
OTHER: 0 %
PROMYELOCYTES ABS: 0 %
Platelets: 256 10*3/uL (ref 150–400)
RBC: 4.35 MIL/uL (ref 4.22–5.81)
RDW: 14.9 % (ref 11.5–15.5)
WBC: 10.1 10*3/uL (ref 4.0–10.5)

## 2017-01-30 LAB — AEROBIC/ANAEROBIC CULTURE (SURGICAL/DEEP WOUND)

## 2017-01-30 LAB — AEROBIC/ANAEROBIC CULTURE W GRAM STAIN (SURGICAL/DEEP WOUND): Culture: NO GROWTH

## 2017-01-30 LAB — C-REACTIVE PROTEIN: CRP: 13.3 mg/dL — ABNORMAL HIGH (ref <1.0)

## 2017-01-30 LAB — SEDIMENTATION RATE: Sed Rate: 27 mm/hr — ABNORMAL HIGH (ref 0–16)

## 2017-01-30 MED ORDER — ASPIRIN 325 MG PO TBEC: 325.0000 mg | DELAYED_RELEASE_TABLET | Freq: Two times a day (BID) | ORAL | 0 refills | Status: AC

## 2017-01-30 MED ORDER — OXYCODONE HCL 10 MG PO TABS: 30.0000 mg | ORAL_TABLET | ORAL | 0 refills | Status: DC | PRN

## 2017-01-30 MED ORDER — DOXYCYCLINE HYCLATE 100 MG PO CAPS: 100.0000 mg | ORAL_CAPSULE | Freq: Two times a day (BID) | ORAL | 1 refills | Status: AC

## 2017-01-30 MED ORDER — DOXYCYCLINE HYCLATE 100 MG PO CAPS: 100.0000 mg | ORAL_CAPSULE | Freq: Two times a day (BID) | ORAL | 1 refills | Status: DC

## 2017-01-30 NOTE — Progress Notes (Signed)
Physical Therapy Treatment Patient Details Name: Dustin [name] MRN: [id number] DOB: [birth year] Today's Date: 01/30/2017    History of Present Illness Dustin [name] is a 50 y/o male s/p removal of R femur IM nail on 01/25/17. Patient with a PMHx significant for femur fracture in 2017, HTN, PTSD.    PT Comments    Pt progressing towards goals and able to increase ambulation distance. Continues to remain very guarded during gait and presents with decreased weightshift to RLE. Decreased assist level to supervision this session. Reports he will be getting HHPT services at d/c. Will continue to follow acutely to maximize functional mobility independence and safety.    Follow Up Recommendations  DC plan and follow up therapy as arranged by surgeon;Home health PT;Supervision for mobility/OOB     Equipment Recommendations  None recommended by PT    Recommendations for Other Services       Precautions / Restrictions Precautions Precautions: Fall Restrictions Weight Bearing Restrictions: Yes RLE Weight Bearing: Weight bearing as tolerated    Mobility  Bed Mobility Overal bed mobility: Modified Independent                Transfers Overall transfer level: Needs assistance Equipment used: 4-wheeled walker Transfers: Sit to/from Stand Sit to Stand: Supervision         General transfer comment: Supervision for safety. Adjusted handle height so pt could extend UEs when using rollator. Cues for safe hand placement.   Ambulation/Gait Ambulation/Gait assistance: Supervision Ambulation Distance (Feet): 500 Feet Assistive device: 4-wheeled walker Gait Pattern/deviations: Step-through pattern;Decreased weight shift to right;Decreased stride length Gait velocity: Decreased  Gait velocity interpretation: Below normal speed for age/gender General Gait Details: Supervision for safety. Increased WB noted on RLE however, still presents with decreased stride length. Insisted on using rollator  during ambulation. Very slow gait speed.   Stairs            Wheelchair Mobility    Modified Rankin (Stroke Patients Only)       Balance Overall balance assessment: Needs assistance Sitting-balance support: Bilateral upper extremity supported;Feet supported Sitting balance-[name]: Fair     Standing balance support: Bilateral upper extremity supported;No upper extremity supported;During functional activity Standing balance-[name]: Fair Standing balance comment: Able to maintain static standing without UE support.                             Cognition Arousal/Alertness: Awake/alert Behavior During Therapy: WFL for tasks assessed/performed Overall Cognitive Status: Within Functional Limits for tasks assessed                                        Exercises      General Comments General comments (skin integrity, edema, etc.): IV site swollen and tape missing; notified RN and RN to address. Reviewed ther ex as well.       Pertinent Vitals/Pain Pain Assessment: 0-10 Pain Score: 8  Pain Location: R knee Pain Descriptors / Indicators: Grimacing;Guarding;Sore;Burning Pain Intervention(s): Limited activity within patient's tolerance;Monitored during session;Repositioned    Home Living                      Prior Function            PT Goals (current goals can now be found in the care plan section) Acute Rehab PT Goals Patient Stated  Goal: to go home today  PT Goal Formulation: With patient Time For Goal Achievement: 02/09/17 Potential to Achieve Goals: Good Progress towards PT goals: Progressing toward goals    Frequency    Min 5X/week      PT Plan Current plan remains appropriate    Co-evaluation              AM-PAC PT "6 Clicks" Daily Activity  Outcome Measure  Difficulty turning over in bed (including adjusting bedclothes, sheets and blankets)?: None Difficulty moving from lying on back to  sitting on the side of the bed? : A Little Difficulty sitting down on and standing up from a chair with arms (e.g., wheelchair, bedside commode, etc,.)?: A Little Help needed moving to and from a bed to chair (including a wheelchair)?: A Little Help needed walking in hospital room?: A Little Help needed climbing 3-5 steps with a railing? : A Lot 6 Click Score: 18    End of Session Equipment Utilized During Treatment: Gait belt Activity Tolerance: Patient tolerated treatment well Patient left: in chair;with call bell/phone within reach Nurse Communication: Mobility status PT Visit Diagnosis: Unsteadiness on feet (R26.81);Other abnormalities of gait and mobility (R26.89);Muscle weakness (generalized) (M62.81);Difficulty in walking, not elsewhere classified (R26.2)     Time: 1347-1410 PT Time Calculation (min) (ACUTE ONLY): 23 min  Charges:  $Gait Training: 23-37 mins                    G Codes:       Dustin [name], PT, DPT  Acute Rehabilitation Services  Pager: [phone]    Dustin [name] 01/30/2017, 2:27 PM

## 2017-01-30 NOTE — Progress Notes (Signed)
     Subjective: 5 Days Post-Op Procedure(s) (LRB): REMOVALOF RIGHT FEMUR INTRAMEDULLARY NAIL AND DISTAL INTERLOCKING SCREW, IRRIGATION AND DEBRIDEMENT RIGHT KNEE (Right) Awake, alert and oriented x 4. Patient reports pain as moderate.    Objective:   VITALS:  Temp:  [98.2 F (36.8 C)-98.6 F (37 C)] 98.6 F (37 C) (12/12 0615) Pulse Rate:  [103-118] 114 (12/12 0615) Resp:  [18] 18 (12/12 0615) BP: (153-155)/(87-91) 154/87 (12/12 0615) SpO2:  [96 %-98 %] 98 % (12/12 0615)  Neurologically intact ABD soft Neurovascular intact Sensation intact distally Intact pulses distally Dorsiflexion/Plantar flexion intact Incision: dressing C/D/I and no drainage No cellulitis present Swelling right knee with warmth blallotment.   LABS Recent Labs    01/30/17 0455  HGB 12.5*  WBC 10.1  PLT 256   Recent Labs    01/29/17 1142 01/30/17 0455  NA 136 136  K 3.9 4.1  CL 103 101  CO2 26 24  BUN 9 8  CREATININE 1.23 1.26*  GLUCOSE 112* 99   No results for input(s): LABPT, INR in the last 72 hours.   Assessment/Plan: 5 Days Post-Op Procedure(s) (LRB): REMOVALOF RIGHT FEMUR INTRAMEDULLARY NAIL AND DISTAL INTERLOCKING SCREW, IRRIGATION AND DEBRIDEMENT RIGHT KNEE (Right)  Advance diet Up with therapy D/C IV fluids Continue ABX therapy due to Post-op infection  [name] 01/30/2017, 8:12 AMPatient ID: Dustin [name], male   DOB: 05/04/1965, 51 y.o.   MRN: [id number]

## 2017-01-30 NOTE — Care Management Important Message (Signed)
Important Message  Patient Details  Name: Dustin [name] MRN: [id number] Date of Birth: Sep 01, 1965   Medicare Important Message Given:  Yes    [name] 01/30/2017, 9:35 AM

## 2017-01-30 NOTE — Progress Notes (Signed)
Dustin [name] to be D/C'd Home per MD order.  Discussed prescriptions and follow up appointments with the patient. Prescriptions given to patient, medication list explained in detail. Pt verbalized understanding.  Allergies as of 01/30/2017      Reactions   Latex Rash, Swelling   Mobic [meloxicam] Nausea Only      Medication List    TAKE these medications   aspirin 325 MG EC tablet Take 1 tablet (325 mg total) by mouth 2 (two) times daily after a meal. What changed:  when to take this   aspirin 325 MG EC tablet Take 1 tablet (325 mg total) by mouth 2 (two) times daily after a meal. What changed:  You were already taking a medication with the same name, and this prescription was added. Make sure you understand how and when to take each.   cyclobenzaprine 10 MG tablet Commonly known as:  FLEXERIL Take 1 tablet (10 mg total) by mouth 3 (three) times daily as needed for muscle spasms.   doxycycline 100 MG capsule Commonly known as:  VIBRAMYCIN Take 1 capsule (100 mg total) by mouth 2 (two) times daily.   lisinopril-hydrochlorothiazide 20-25 MG tablet Commonly known as:  PRINZIDE,ZESTORETIC Take 1 tablet by mouth daily.   meloxicam 15 MG tablet Commonly known as:  MOBIC Take 1 tablet (15 mg total) by mouth daily.   Oxycodone HCl 10 MG Tabs Take 3 tablets (30 mg total) by mouth every 4 (four) hours as needed for breakthrough pain ((score 4 to 6)). What changed:    medication strength  how much to take  when to take this  reasons to take this   oxymorphone 10 MG 12 hr tablet Commonly known as:  OPANA ER Take 1 tablet (10 mg total) by mouth every 8 (eight) hours.   sulfamethoxazole-trimethoprim 800-160 MG tablet Commonly known as:  BACTRIM DS,SEPTRA DS Take one tablet po every 12 hours. What changed:    how much to take  how to take this  when to take this  additional instructions            Durable Medical Equipment  (From admission, onward)        Start     Ordered   01/25/17 1738  DME Walker rolling  Once    Question:  Patient needs a walker to treat with the following condition  Answer:  Septic arthritis of knee, right ([facility])   01/25/17 1738      Vitals:   01/30/17 0615 01/30/17 1424  BP: (!) 154/87 (!) 150/94  Pulse: (!) 114 (!) 108  Resp: 18 18  Temp: 98.6 F (37 C) 97.8 F (36.6 C)  SpO2: 98% 100%    Skin clean, dry and intact without evidence of skin break down, no evidence of skin tears noted. ACE wrap in place on right knee.IV catheter discontinued intact. Site without signs and symptoms of complications. Dressing and pressure applied. Pt denies pain at this time. No complaints noted.  An After Visit Summary and prescriptions were printed and given to the patient. Patient escorted via walking with walker, refused WC, and D/C home via private auto.  [place] RN

## 2017-01-31 ENCOUNTER — Telehealth: Payer: Self-pay

## 2017-01-31 NOTE — Telephone Encounter (Signed)
01/31/17  TCM Hospital follow up  Appointment scheduled for 02/07/17  Transition Care Management Follow-up Telephone Call  ADMISSION DATE: 01/25/17  DISCHARGE DATE: 01/30/17   How have you been since you were released from the hospital? Pain in leg from procedure. Swelling in Left arm from wrist to elbow whre IV was placed   Do you understand why you were in the hospital? Yes has nail and rod removed from leg.    Do you understand the discharge instrcutions? Yes    Items Reviewed:  Medications reviewed: Yes   Allergies reviewed: Mobic   Dietary changes reviewed: Regular   Referrals reviewed:   Functional Questionnaire:   Activities of Daily Living (ADLs): Needs assistance with all ADLs at this time. Has sitter  Any patient concerns? Pain in leg ans swelling from wrist to elbow where IV was placed   Confirmed importance and date/time of follow-up visits scheduled:Yes   Confirmed with patient if condition begins to worsen call PCP or go to the ER. Yes    Patient was given the office number and encouragred to call back with questions or concerns. Yes

## 2017-01-31 NOTE — Discharge Summary (Addendum)
Physician Discharge Summary      Patient ID: Dustin [name] MRN: [id number] DOB/AGE: 1965-10-11 51 y.o.  Admit date: 01/25/2017 Discharge date: 01/30/2017  Admission Diagnoses:  Active Problems:   Septic arthritis of knee, right ([facility])   S/P hardware removal   Discharge Diagnoses:  Same  Past Medical History:  Diagnosis Date  . Arthritis    "right knee, hips; lower back"  (11/09/2016)  . Chronic lower back pain   . Dyspnea    with exertion  . Hypertension   . Obesity   . PTSD (post-traumatic stress disorder)     Surgeries: Procedure(s): REMOVALOF RIGHT FEMUR INTRAMEDULLARY NAIL AND DISTAL INTERLOCKING SCREW, IRRIGATION AND DEBRIDEMENT RIGHT KNEE on 01/25/2017   Consultants:   Discharged Condition: Improved  Hospital Course: Dustin [name] is an 51 y.o. male who was admitted 01/25/2017 with a chief complaint of No chief complaint on file. , and found to have a diagnosis of <principal problem not specified>.  He was brought to the operating room on 01/25/2017 and underwent the above named procedures.    He was given perioperative antibiotics:  Anti-infectives (From admission, onward)   Start     Dose/Rate Route Frequency Ordered Stop   01/30/17 0800  Oritavancin Diphosphate (ORBACTIV) 1,200 mg in dextrose 5 % IVPB     1,200 mg 333.3 mL/hr over 180 Minutes Intravenous Once 01/29/17 1346 01/30/17 1602   01/30/17 0000  doxycycline (VIBRAMYCIN) 100 MG capsule  Status:  Discontinued     100 mg Oral 2 times daily 01/30/17 0824 01/30/17    01/30/17 0000  doxycycline (VIBRAMYCIN) 100 MG capsule     100 mg Oral 2 times daily 01/30/17 0827     01/26/17 2200  vancomycin (VANCOCIN) IVPB 1000 mg/200 mL premix  Status:  Discontinued     1,000 mg 200 mL/hr over 60 Minutes Intravenous Every 8 hours 01/26/17 1749 01/30/17 2029   01/26/17 1500  vancomycin (VANCOCIN) IVPB 1000 mg/200 mL premix     1,000 mg 200 mL/hr over 60 Minutes Intravenous Every 12 hours 01/26/17 1421 01/26/17 1602   01/25/17 2200  sulfamethoxazole-trimethoprim (BACTRIM DS,SEPTRA DS) 800-160 MG per tablet 1 tablet  Status:  Discontinued    Comments:  Take one tablet po every 12 hours.     1 tablet Oral 2 times daily 01/25/17 1738 01/26/17 1746   01/25/17 1900  vancomycin (VANCOCIN) IVPB 1000 mg/200 mL premix     1,000 mg 200 mL/hr over 60 Minutes Intravenous Every 12 hours 01/25/17 1738 01/25/17 2100   01/25/17 1545  polymyxin B 500,000 Units, bacitracin 50,000 Units in sodium chloride 0.9 % 500 mL irrigation  Status:  Discontinued       As needed 01/25/17 1545 01/25/17 1608    No IV preop antibiotics were given, he was no Septra DS BID prior to admit. In OR cultures were obtained from the right knee with initial entry into the right knee joint and with the removal of the IM Nail and deeper drainage and exudate about the deep portions of the IM Nail. Post operatively he was maintained on po Septra and IV Vancomycin. He was stable post op and a Hemovac drain was draining primarily bloody drainage day one.  An infectious disease consult was obtain with Dr. Graylon Good and she gave some recommendations for post hospital stay and Continued to follow this patient while hospitalized with her Associate Dr. Johnnye Sima. Initial sed rate 18 and CRP < 0.8 with slight elevation of WBC 11K with left shift.  PT/OT initiated. POD #2 therapy continued, due to inclimate weather he remained hospitalized and IV vancomycin continued. POD#3 he remained Afebrile and VSS, Dr Johnnye Sima saw and intraoperative cultures returned with Staphlococcus lugdunensis, pan sensitive with the  Exception of oxacillin. He remained on IV vancomycin for a total of 5 day and on POD#5 12/31/2016 his vancomycin was discontinued and he was given a single IV dose of oritavancin and was discharged home on doxycycline 100 mg BID. Dressing changes post operatively demonstrated no further drainage following the removal of the intraoperatively placed large hemovac. He did  have mild post operative knee swelling and warmth but remained afebrile with improved laboratory testing of inflamatory markers. Sed rate is expected to remain elevated for some time following initiation of treatment of this infection.  He was given sequential compression devices, early ambulation, and chemoprophylaxis for DVT prophylaxis.  He benefited maximally from their hospital stay and there were no complications.    Recent vital signs:  Vitals:   01/30/17 0615 01/30/17 1424  BP: (!) 154/87 (!) 150/94  Pulse: (!) 114 (!) 108  Resp: 18 18  Temp: 98.6 F (37 C) 97.8 F (36.6 C)  SpO2: 98% 100%    Recent laboratory studies:  Results for orders placed or performed during the hospital encounter of 01/25/17  Aerobic/Anaerobic Culture (surgical/deep wound)  Result Value Ref Range   Specimen Description WOUND    Special Requests PURULENT DISCHARGE FROM JM NAIL    Gram Stain      FEW WBC PRESENT, PREDOMINANTLY PMN MODERATE GRAM POSITIVE COCCI    Culture      FEW STAPHYLOCOCCUS LUGDUNENSIS NO ANAEROBES ISOLATED    Report Status 01/30/2017 FINAL    Organism ID, Bacteria STAPHYLOCOCCUS LUGDUNENSIS       Susceptibility   Staphylococcus lugdunensis - MIC*    CIPROFLOXACIN <=0.5 SENSITIVE Sensitive     ERYTHROMYCIN <=0.25 SENSITIVE Sensitive     GENTAMICIN <=0.5 SENSITIVE Sensitive     OXACILLIN >=4 RESISTANT Resistant     TETRACYCLINE <=1 SENSITIVE Sensitive     VANCOMYCIN <=0.5 SENSITIVE Sensitive     TRIMETH/SULFA <=10 SENSITIVE Sensitive     CLINDAMYCIN <=0.25 SENSITIVE Sensitive     RIFAMPIN <=0.5 SENSITIVE Sensitive     Inducible Clindamycin NEGATIVE Sensitive     * FEW STAPHYLOCOCCUS LUGDUNENSIS  Aerobic/Anaerobic Culture (surgical/deep wound)  Result Value Ref Range   Specimen Description WOUND    Special Requests SOFT TISSUE OVERLAYING IM NAIL    Gram Stain      RARE WBC PRESENT, PREDOMINANTLY MONONUCLEAR NO ORGANISMS SEEN    Culture No growth aerobically or  anaerobically.    Report Status 01/30/2017 FINAL   MRSA PCR Screening  Result Value Ref Range   MRSA by PCR NEGATIVE NEGATIVE  APTT  Result Value Ref Range   aPTT 31 24 - 36 seconds  CBC  Result Value Ref Range   WBC 7.3 4.0 - 10.5 K/uL   RBC 4.84 4.22 - 5.81 MIL/uL   Hemoglobin 13.4 13.0 - 17.0 g/dL   HCT 41.6 39.0 - 52.0 %   MCV 86.0 78.0 - 100.0 fL   MCH 27.7 26.0 - 34.0 pg   MCHC 32.2 30.0 - 36.0 g/dL   RDW 14.7 11.5 - 15.5 %   Platelets 394 150 - 400 K/uL  Comprehensive metabolic panel  Result Value Ref Range   Sodium 138 135 - 145 mmol/L   Potassium 3.7 3.5 - 5.1 mmol/L   Chloride 106 101 -  111 mmol/L   CO2 23 22 - 32 mmol/L   Glucose, Bld 95 65 - 99 mg/dL   BUN 13 6 - 20 mg/dL   Creatinine, Ser 1.00 0.61 - 1.24 mg/dL   Calcium 8.9 8.9 - 10.3 mg/dL   Total Protein 6.9 6.5 - 8.1 g/dL   Albumin 3.6 3.5 - 5.0 g/dL   AST 20 15 - 41 U/L   ALT 13 (L) 17 - 63 U/L   Alkaline Phosphatase 87 38 - 126 U/L   Total Bilirubin 0.3 0.3 - 1.2 mg/dL   GFR calc non Af Amer >60 >60 mL/min   GFR calc Af Amer >60 >60 mL/min   Anion gap 9 5 - 15  Protime-INR  Result Value Ref Range   Prothrombin Time 13.0 11.4 - 15.2 seconds   INR 6.57   Basic metabolic panel  Result Value Ref Range   Sodium 135 135 - 145 mmol/L   Potassium 4.2 3.5 - 5.1 mmol/L   Chloride 101 101 - 111 mmol/L   CO2 24 22 - 32 mmol/L   Glucose, Bld 111 (H) 65 - 99 mg/dL   BUN 11 6 - 20 mg/dL   Creatinine, Ser 0.96 0.61 - 1.24 mg/dL   Calcium 8.8 (L) 8.9 - 10.3 mg/dL   GFR calc non Af Amer >60 >60 mL/min   GFR calc Af Amer >60 >60 mL/min   Anion gap 10 5 - 15  Sedimentation rate  Result Value Ref Range   Sed Rate 18 (H) 0 - 16 mm/hr  CBC with Differential/Platelet  Result Value Ref Range   WBC 10.9 (H) 4.0 - 10.5 K/uL   RBC 4.67 4.22 - 5.81 MIL/uL   Hemoglobin 12.8 (L) 13.0 - 17.0 g/dL   HCT 40.0 39.0 - 52.0 %   MCV 85.7 78.0 - 100.0 fL   MCH 27.4 26.0 - 34.0 pg   MCHC 32.0 30.0 - 36.0 g/dL   RDW 14.7  11.5 - 15.5 %   Platelets 390 150 - 400 K/uL   Neutrophils Relative % 83 %   Neutro Abs 9.1 (H) 1.7 - 7.7 K/uL   Lymphocytes Relative 10 %   Lymphs Abs 1.1 0.7 - 4.0 K/uL   Monocytes Relative 7 %   Monocytes Absolute 0.8 0.1 - 1.0 K/uL   Eosinophils Relative 0 %   Eosinophils Absolute 0.0 0.0 - 0.7 K/uL   Basophils Relative 0 %   Basophils Absolute 0.0 0.0 - 0.1 K/uL  C-reactive protein  Result Value Ref Range   CRP <0.8 <1.0 mg/dL  Hepatitis C antibody  Result Value Ref Range   HCV Ab <0.1 0.0 - 0.9 s/co ratio  Vancomycin, trough  Result Value Ref Range   Vancomycin Tr 19 15 - 20 ug/mL  Basic metabolic panel  Result Value Ref Range   Sodium 136 135 - 145 mmol/L   Potassium 3.9 3.5 - 5.1 mmol/L   Chloride 103 101 - 111 mmol/L   CO2 26 22 - 32 mmol/L   Glucose, Bld 112 (H) 65 - 99 mg/dL   BUN 9 6 - 20 mg/dL   Creatinine, Ser 1.23 0.61 - 1.24 mg/dL   Calcium 8.5 (L) 8.9 - 10.3 mg/dL   GFR calc non Af Amer >60 >60 mL/min   GFR calc Af Amer >60 >60 mL/min   Anion gap 7 5 - 15  CBC with Differential/Platelet  Result Value Ref Range   WBC 10.1 4.0 - 10.5 K/uL  RBC 4.35 4.22 - 5.81 MIL/uL   Hemoglobin 12.5 (L) 13.0 - 17.0 g/dL   HCT 37.6 (L) 39.0 - 52.0 %   MCV 86.4 78.0 - 100.0 fL   MCH 28.7 26.0 - 34.0 pg   MCHC 33.2 30.0 - 36.0 g/dL   RDW 14.9 11.5 - 15.5 %   Platelets 256 150 - 400 K/uL   Neutrophils Relative % 63 %   Lymphocytes Relative 25 %   Monocytes Relative 11 %   Eosinophils Relative 1 %   Basophils Relative 0 %   Band Neutrophils 0 %   Metamyelocytes Relative 0 %   Myelocytes 0 %   Promyelocytes Absolute 0 %   Blasts 0 %   nRBC 0 0 /100 WBC   Other 0 %   Neutro Abs 6.4 1.7 - 7.7 K/uL   Lymphs Abs 2.5 0.7 - 4.0 K/uL   Monocytes Absolute 1.1 (H) 0.1 - 1.0 K/uL   Eosinophils Absolute 0.1 0.0 - 0.7 K/uL   Basophils Absolute 0.0 0.0 - 0.1 K/uL   WBC Morphology ATYPICAL LYMPHOCYTES   Comprehensive metabolic panel  Result Value Ref Range   Sodium 136  135 - 145 mmol/L   Potassium 4.1 3.5 - 5.1 mmol/L   Chloride 101 101 - 111 mmol/L   CO2 24 22 - 32 mmol/L   Glucose, Bld 99 65 - 99 mg/dL   BUN 8 6 - 20 mg/dL   Creatinine, Ser 1.26 (H) 0.61 - 1.24 mg/dL   Calcium 9.2 8.9 - 10.3 mg/dL   Total Protein 7.1 6.5 - 8.1 g/dL   Albumin 3.7 3.5 - 5.0 g/dL   AST 17 15 - 41 U/L   ALT 12 (L) 17 - 63 U/L   Alkaline Phosphatase 85 38 - 126 U/L   Total Bilirubin 1.3 (H) 0.3 - 1.2 mg/dL   GFR calc non Af Amer >60 >60 mL/min   GFR calc Af Amer >60 >60 mL/min   Anion gap 11 5 - 15  C-reactive protein  Result Value Ref Range   CRP 13.3 (H) <1.0 mg/dL  Sedimentation rate  Result Value Ref Range   Sed Rate 27 (H) 0 - 16 mm/hr    Discharge Medications:   Allergies as of 01/30/2017      Reactions   Latex Rash, Swelling   Mobic [meloxicam] Nausea Only      Medication List    TAKE these medications   aspirin 325 MG EC tablet Take 1 tablet (325 mg total) by mouth 2 (two) times daily after a meal. What changed:  when to take this   aspirin 325 MG EC tablet Take 1 tablet (325 mg total) by mouth 2 (two) times daily after a meal. What changed:  You were already taking a medication with the same name, and this prescription was added. Make sure you understand how and when to take each.   cyclobenzaprine 10 MG tablet Commonly known as:  FLEXERIL Take 1 tablet (10 mg total) by mouth 3 (three) times daily as needed for muscle spasms.   doxycycline 100 MG capsule Commonly known as:  VIBRAMYCIN Take 1 capsule (100 mg total) by mouth 2 (two) times daily.   lisinopril-hydrochlorothiazide 20-25 MG tablet Commonly known as:  PRINZIDE,ZESTORETIC Take 1 tablet by mouth daily.   meloxicam 15 MG tablet Commonly known as:  MOBIC Take 1 tablet (15 mg total) by mouth daily.   Oxycodone HCl 10 MG Tabs Take 3 tablets (30 mg total) by  mouth every 4 (four) hours as needed for breakthrough pain ((score 4 to 6)). What changed:    medication strength  how  much to take  when to take this  reasons to take this   oxymorphone 10 MG 12 hr tablet Commonly known as:  OPANA ER Take 1 tablet (10 mg total) by mouth every 8 (eight) hours.   sulfamethoxazole-trimethoprim 800-160 MG tablet Commonly known as:  BACTRIM DS,SEPTRA DS Take one tablet po every 12 hours. What changed:    how much to take  how to take this  when to take this  additional instructions       Diagnostic Studies: Dg Knee Complete 4 Views Right  Result Date: 01/08/2017 CLINICAL DATA:  Pain and swelling x2 days. Recent surgery 1 month ago to remove hardware with knee infection 2 weeks ago. EXAM: RIGHT KNEE - COMPLETE 4+ VIEW COMPARISON:  05/18/2016 radiographs and CT from 08/27/2016 FINDINGS: Partially imaged retrograde intramedullary femoral nail with single distal femoral screw is now noted. The more caudal interlocking screw has been removed. No frank bone destruction or acute fracture. Tunneling for ACL repair is noted within the femur and tibia as before. There is lateral femorotibial joint space narrowing. There is a small suprapatellar joint effusion. There is mild soft tissue swelling along the anterior aspect of the knee and thigh. Small ossifications adjacent to the medial femoral condyle would be in keeping with old remote MCL injury. IMPRESSION: 1. There is a small suprapatellar joint effusion with mild soft tissue swelling. No acute osseous appearing abnormality. 2. Retrograde intramedullary nail fixation of the right femur with single interlocking screw now noted since prior. 3. Status post ACL graft repair. 4. Lateral femorotibial joint space narrowing. Electronically Signed   By: Ashley Royalty M.D.   On: 01/08/2017 21:17   Dg C-arm 1-60 Min-no Report  Result Date: 01/25/2017 Fluoroscopy was utilized by the requesting physician.  No radiographic interpretation.    Disposition: 01-Home or Self Care  Discharge Instructions    Call MD / Call 911   Complete by:   As directed    If you experience chest pain or shortness of breath, CALL 911 and be transported to the hospital emergency room.  If you develope a fever above 101 F, pus (white drainage) or increased drainage or redness at the wound, or calf pain, call your surgeon's office.   Constipation Prevention   Complete by:  As directed    Drink plenty of fluids.  Prune juice may be helpful.  You may use a stool softener, such as Colace (over the counter) 100 mg twice a day.  Use MiraLax (over the counter) for constipation as needed.   Diet - low sodium heart healthy   Complete by:  As directed    Discharge instructions   Complete by:  As directed    Keep knee incisions and dressing dry for 4 days post op then may wet while bathing and apply bandaids. Call if fever or chills or increased drainage. Go to ER if acutely short of breath or call for ambulance. Return for follow up in 2 weeks. May full weight bear on the surgical leg unless told otherwise.Take full strength aspirin 325 mg with meal or snack twice a day to prevent blood clots. In house walking for first 2 weeks.   Driving restrictions   Complete by:  As directed    No driving for 4 weeks   Increase activity slowly as tolerated   Complete  by:  As directed    Lifting restrictions   Complete by:  As directed    No lifting for 6 weeks      Follow-up Information    Jessy Oto, MD Follow up in 1 week(s).   Specialty:  Orthopedic Surgery Why:  For wound re-check Contact information: [place] [address] [phone]        [facility] Follow up.   Specialty:  [place] Why:  A representative from [facility] will contact you to arrange start date and time for your therapy. Contact information: [address] [place] [address] [phone]        Campbell Riches, MD Follow up in 4 week(s).   Specialty:  Infectious Diseases Contact information: [place] [address] [phone]            Signed: [name] 01/31/2017, 6:15 AM

## 2017-02-01 ENCOUNTER — Telehealth (INDEPENDENT_AMBULATORY_CARE_PROVIDER_SITE_OTHER): Payer: Self-pay | Admitting: Specialist

## 2017-02-01 NOTE — Telephone Encounter (Signed)
°  Dustin [name]  [facility]  [phone]    Pt is requesting physical therapy at the home on 02/04/2017

## 2017-02-01 NOTE — Telephone Encounter (Signed)
I advised that this is ok

## 2017-02-05 ENCOUNTER — Telehealth (INDEPENDENT_AMBULATORY_CARE_PROVIDER_SITE_OTHER): Payer: Self-pay | Admitting: Specialist

## 2017-02-05 NOTE — Telephone Encounter (Signed)
Received voicemail message from Hough (PT) with [facility] she advised could not contact patient to do start of care visit. Flore asked if she can get a new start of care orders and she will try to contact patient tomorrow. Flore asked if it will be any impact to the patient not doing start of care today. The number to contact Flore is [phone]

## 2017-02-07 ENCOUNTER — Inpatient Hospital Stay: Payer: Medicare HMO | Admitting: Family Medicine

## 2017-02-07 NOTE — Telephone Encounter (Signed)
I called and lmom for Flore to start PT as soon as the patient can be reached.  And to call me back if there are any other questions

## 2017-02-08 ENCOUNTER — Telehealth (INDEPENDENT_AMBULATORY_CARE_PROVIDER_SITE_OTHER): Payer: Self-pay | Admitting: Radiology

## 2017-02-08 NOTE — Telephone Encounter (Signed)
Darlina Guys from [facility] is calling to advise that patient has a start date for PT on 02/15/2017.---- Dr. Louanne Skye has been made aware of this.

## 2017-02-18 ENCOUNTER — Telehealth (INDEPENDENT_AMBULATORY_CARE_PROVIDER_SITE_OTHER): Payer: Self-pay | Admitting: Specialist

## 2017-02-18 NOTE — Telephone Encounter (Signed)
Flor with [facility] needs PT verbal orders for patient  3x for 2 weeks  CB # [phone]

## 2017-02-20 NOTE — Telephone Encounter (Signed)
I called and gave verbal auth for 3 visits per week for 2 weeks

## 2017-02-21 ENCOUNTER — Ambulatory Visit (INDEPENDENT_AMBULATORY_CARE_PROVIDER_SITE_OTHER): Payer: Medicare HMO | Admitting: Specialist

## 2017-02-21 ENCOUNTER — Encounter (INDEPENDENT_AMBULATORY_CARE_PROVIDER_SITE_OTHER): Payer: Self-pay | Admitting: Specialist

## 2017-02-21 VITALS — BP 171/108 | HR 92 | Ht 73.5 in | Wt 252.0 lb

## 2017-02-21 DIAGNOSIS — M86651 Other chronic osteomyelitis, right thigh: Secondary | ICD-10-CM

## 2017-02-21 DIAGNOSIS — Z9889 Other specified postprocedural states: Secondary | ICD-10-CM

## 2017-02-21 MED ORDER — OXYCODONE HCL 10 MG PO TABS: 30.0000 mg | ORAL_TABLET | ORAL | 0 refills | Status: DC | PRN

## 2017-02-21 NOTE — Progress Notes (Signed)
Post-Op Visit Note   Patient: Dustin [name]           Date of Birth: 1965-12-21           MRN: [id number] Visit Date: 02/21/2017 PCP: Shelda Pal, DO   Assessment & Plan: 3 weeks post removal of right femur IM Nail and I&D of the right femur and rod track. Staph species.   Chief Complaint:  Chief Complaint  Patient presents with  . Right Leg - Routine Post Op   Visit Diagnoses:  1. Chronic osteomyelitis of right femur ([place])   2. History of removal of retained hardware   Right knee incision anterior medial parapatella and lateral right knee is healed and there is no drainage. Right leg is NV normal. No xrays.  Plan: You may ambulate, weight bearing as tolerated. May shower and wet the area of the incisions over the anterior and lateral right knee. PT for ROM of the right knee and stretching exercises. Be seen by Dr. Johnnye Sima, infectious disease consultant for evaluation of the right leg infection and osteomyelitis. Continue on doxycycline and stopped the Bactrim.  Follow-Up Instructions: Return in about 4 weeks (around 03/21/2017).   Orders:  No orders of the defined types were placed in this encounter.  No orders of the defined types were placed in this encounter.   Imaging: No results found.  PMFS History: Patient Active Problem List   Diagnosis Date Noted  . Chronic osteomyelitis of right femur ([place]) 01/29/2017    Priority: High    Class: Chronic  . Chronic pain syndrome 11/27/2016    Priority: High    Class: Chronic  . Traumatic seroma of thigh, right, sequela 09/11/2016    Priority: Low    Class: Chronic  . Retained orthopedic hardware 09/11/2016    Priority: Low    Class: Chronic  . S/P hardware removal 01/25/2017  . Effusion of right knee joint 01/09/2017  . Septic arthritis of knee, right ([place]) 11/09/2016    Class: Acute  . Effusion of right knee 11/08/2016  . Thigh hematoma, right, sequela 01/23/2016  . Essential hypertension 12/15/2015   . Distal radius fracture, left 04/02/2015  . Closed fracture of right femur ([place]) 04/01/2015   Past Medical History:  Diagnosis Date  . Arthritis    "right knee, hips; lower back"  (11/09/2016)  . Chronic lower back pain   . Dyspnea    with exertion  . Hypertension   . Obesity   . PTSD (post-traumatic stress disorder)     Family History  Problem Relation Age of Onset  . Colon cancer Neg Hx     Past Surgical History:  Procedure Laterality Date  . ANTERIOR CERVICAL DECOMP/DISCECTOMY FUSION  2007   C 5, 6 ,7  . BACK SURGERY    . CLOSED REDUCTION WRIST FRACTURE Left 04/01/2015   Procedure: CLOSED REDUCTION WRIST AND CASTING REPAIR LACERATION TO FINGERS TIMES 2;  Surgeon: Dorna Leitz, MD;  Location: [facility];  Service: Orthopedics;  Laterality: Left;  . FEMUR IM NAIL Right 04/01/2015   Procedure: INTRAMEDULLARY (IM) RETROGRADE FEMORAL NAILING ;  Surgeon: Dorna Leitz, MD;  Location: [facility];  Service: Orthopedics;  Laterality: Right;  . FRACTURE SURGERY    . HARDWARE REMOVAL Right 10/09/2016   Procedure: Dynamization of Nail Right  femur, removal of Screws and Excision of hematoma cavity right thigh;  Surgeon: Jessy Oto, MD;  Location: [facility];  Service: Orthopedics;  Laterality: Right;  . HARDWARE REMOVAL  Right 01/25/2017   Procedure: REMOVALOF RIGHT FEMUR INTRAMEDULLARY NAIL AND DISTAL INTERLOCKING SCREW, IRRIGATION AND DEBRIDEMENT RIGHT KNEE;  Surgeon: Jessy Oto, MD;  Location: [facility];  Service: Orthopedics;  Laterality: Right;  . HEMATOMA EVACUATION Right 10/09/2016   Procedure: EVACUATION HEMATOMA;  Surgeon: Jessy Oto, MD;  Location: [place];  Service: Orthopedics;  Laterality: Right;  . KNEE ARTHROSCOPY Right 11/09/2016   I&D  . KNEE ARTHROSCOPY Right 11/09/2016   Procedure: ARTHROSCOPIC IRRIGATION AND DEBRIDEMENT RIGHT KNEE;  Surgeon: Jessy Oto, MD;  Location: [facility];  Service: Orthopedics;  Laterality: Right;  . KNEE ARTHROSCOPY W/ ACL RECONSTRUCTION Right 2001  . LABRAL  REPAIR Right 06/2005  . RESECTION DISTAL CLAVICAL Right 2009   Social History   Occupational History  . Not on file  Tobacco Use  . Smoking status: Never Smoker  . Smokeless tobacco: Never Used  Substance and Sexual Activity  . Alcohol use: Yes    Comment: 11/09/2016 "might have 1 shot/year; if that"  . Drug use: No  . Sexual activity: Not Currently

## 2017-02-21 NOTE — Patient Instructions (Signed)
  Plan: You may ambulate, weight bearing as tolerated. May shower and wet the area of the incisions over the anterior and lateral right knee. PT for ROM of the right knee and stretching exercises. Be seen by Dr. Johnnye Sima, infectious disease consultant for evaluation of the right leg infection and osteomyelitis. Continue on doxycycline and stopped the Bactrim.

## 2017-02-21 NOTE — Addendum Note (Signed)
Addended by: [name] on: 02/21/2017 12:12 PM   Modules accepted: Orders

## 2017-02-22 ENCOUNTER — Telehealth (INDEPENDENT_AMBULATORY_CARE_PROVIDER_SITE_OTHER): Payer: Self-pay | Admitting: Radiology

## 2017-02-22 NOTE — Telephone Encounter (Signed)
Patient called in requesting for more pain medication oxycodone 10mg .  He is having increased right leg pain. Patient had surgery 01/25/17 removal of right IM nail.  Patient states pain management physician Dr. Primus Bravo is willing to refill Rx if Dr. Louanne Skye approves and sends in letter approving due to the surgery and lower extremity pain, he will not take a verbal.  Contact Dr. Primus Bravo at [phone] if needed.

## 2017-02-22 NOTE — Telephone Encounter (Signed)
Patient called in requesting for more pain medication oxycodone 10mg .  He is having increased right leg pain. Patient had surgery 01/25/17 removal of right IM nail.  Patient states pain management physician Dr. Primus Bravo is willing to refill Rx if Dr. Louanne Skye approves and sends in letter approving due to the surgery and lower extremity pain, he will not take a verbal.  Contact Dr. Primus Bravo at [phone] if needed.  Patient call back # [phone]

## 2017-02-25 ENCOUNTER — Telehealth (INDEPENDENT_AMBULATORY_CARE_PROVIDER_SITE_OTHER): Payer: Self-pay | Admitting: Specialist

## 2017-02-25 NOTE — Telephone Encounter (Signed)
Dustin [name], a nurse case manager for [facility] called stating that the patient has called them several times stating that he is in severe pain.  He also stated that his pain management Dr (Dr. Gerald Stabs [phone]) will prescribe additional pain medication but will only do so if Dr. Louanne Skye writes a letter or calls him.  Her CB#[phone] ext [phone].  Thank you.

## 2017-02-25 NOTE — Telephone Encounter (Signed)
Vaughan Basta, a nurse case manager for [facility] called stating that the patient has called them several times stating that he is in severe pain.  He also stated that his pain management Dr (Dr. Gerald Stabs [phone]) will prescribe additional pain medication but will only do so if Dr. Louanne Skye writes a letter or calls him.  Her CB#[phone] ext [phone].  Thank you.

## 2017-02-26 NOTE — Telephone Encounter (Signed)
Vaughan Basta, nurse case manager for [facility] called to check the status of the message and wanted to know when the patient expected to hear from Dr. Louanne Skye

## 2017-02-26 NOTE — Telephone Encounter (Signed)
Patient called back wanted to check status of pain medication. He stated that Dr Louanne Skye had told him last time he was seen he wanted his medication to go through pain management but patient is stating his pain management doctor told him he needed to talk with Dr Louanne Skye first. Patient wanting to know how much longer until Dr Louanne Skye will address this with his pain mgmt doctor. Please call to discuss [phone]

## 2017-02-27 NOTE — Telephone Encounter (Signed)
Patient called back wanted to check status of pain medication. He stated that Dr Louanne Skye had told him last time he was seen he wanted his medication to go through pain management but patient is stating his pain management doctor told him he needed to talk with Dr Louanne Skye first. Patient wanting to know how much longer until Dr Louanne Skye will address this with his pain mgmt doctor. Please call to discuss [phone]

## 2017-02-28 ENCOUNTER — Encounter (INDEPENDENT_AMBULATORY_CARE_PROVIDER_SITE_OTHER): Payer: Self-pay | Admitting: Specialist

## 2017-02-28 ENCOUNTER — Telehealth (INDEPENDENT_AMBULATORY_CARE_PROVIDER_SITE_OTHER): Payer: Self-pay | Admitting: Specialist

## 2017-02-28 NOTE — Telephone Encounter (Signed)
Flor from [facility] called requesting a VO to extend the HHPT due to the patient being out of town and them not being able to see him this week.  She is requesting the VO for the following:  2x a week for 2 weeks.  Also she would like to know if there is any plan for Dr. Louanne Skye to send the patient to outpatient therapy??  CB#[phone].  Thank you.

## 2017-03-01 ENCOUNTER — Encounter (INDEPENDENT_AMBULATORY_CARE_PROVIDER_SITE_OTHER): Payer: Self-pay | Admitting: Radiology

## 2017-03-01 NOTE — Telephone Encounter (Signed)
I called and lmom and gave verbal auth for continuing care

## 2017-03-01 NOTE — Telephone Encounter (Signed)
Dustin [name] has chronic pain and has been of chronic pain medications. He is well beyond 2-3 weeks post surgery of the right knee and leg for removal of hardware and I and D of a right thigh infections. He should return to pain management for his narcotic pain medications as his surgical pain is not the larger cause for his need for narcotics. jen

## 2017-03-01 NOTE — Telephone Encounter (Signed)
Letter done and faxed to Dr. Alphonzo Cruise office @ [phone] per pt request

## 2017-03-08 ENCOUNTER — Telehealth (INDEPENDENT_AMBULATORY_CARE_PROVIDER_SITE_OTHER): Payer: Self-pay | Admitting: Specialist

## 2017-03-08 NOTE — Telephone Encounter (Signed)
Flor with [facility] says they will discharge patient due to them not being able to get in touch with him to complete PT program.

## 2017-03-15 ENCOUNTER — Encounter (INDEPENDENT_AMBULATORY_CARE_PROVIDER_SITE_OTHER): Payer: Self-pay | Admitting: Specialist

## 2017-03-15 NOTE — Progress Notes (Signed)
Subjective:    Patient ID: Dustin [name], male    DOB: [birth year], 52 y.o.   MRN: [id number]  Chief Complaint  Patient presents [name]  . Hypertension    not on medicaton for 3 days   . Follow-up    hospital follow up    HPI:  Dustin [name] is a 52 y.o. male who presents today for a hospitalization follow up fir septic arthritis located in his right knee.   Dustin [name] experienced a right femur fracture in 2017 undergoing ORIF for repair. This was complicated by septic arthritis located in his right knee for which Dustin had hardware and traumatic seroma. Initially underwent I&D on 9/21 [name] cultures being negative at the time. Recommended PICC and treatment [name] vancomycin and ceftriaxone, however Dustin did not want a PICC line and was treated [name] oral Bactrim. After completion of antibiotics Dustin was seen again the ED [name] increasing right knee pain. Aspiration showed 123K [name] 94% neutrophils, however cultures were once again negative. Bactrim was restarted [name] the recommendation to have hardware removed and knee washout which was completed on 12/7. Fluid at the time of surgery was clear, however there was purulence after nail removal. Cultures revealed Staph Ludgenenisis. Vancomycin was continued while in the hospital and Dustin was started on doxycycline for at least 3 months [name] ID clinic follow up.  Dustin [name] [name] the chief complaint of a headache and concern for infection. Dustin [name] an increasing headache. Dustin reported taking his doxycycline as prescribed. His blood pressure was elevated as Dustin was not taking his medications for at least 3 days and headache was improved [name] oxycodone and Toradol. WBC count was normal and Dustin was afebrile. Dustin has followed up [name] Dr. Louanne Skye and was healing well. Has a follow up appointment in 3 days.   Continues to experience the associated symptoms of pain located in his right knee and thigh that is unchanged from  previous. Decreased appetite [name] occasional night sweats. Symptoms appear to have worsened and feels like when his knee was previously infected. Dustin reports taking the doxycyline as prescribed [name] no adverse side effects. Denies fevers. Does have occasional swelling in the right knee and thigh.   Allergies  Allergen Reactions  . Latex Rash and Swelling  . Mobic [Meloxicam] Nausea Only      Outpatient Medications Prior to Visit  Medication Sig Dispense Refill  . aspirin EC 325 MG EC tablet Take 1 tablet (325 mg total) by mouth 2 (two) times daily after a meal. (Patient taking differently: Take 325 mg by mouth daily. ) 60 tablet 0  . aspirin EC 325 MG EC tablet Take 1 tablet (325 mg total) by mouth 2 (two) times daily after a meal. 60 tablet 0  . cyclobenzaprine (FLEXERIL) 10 MG tablet Take 1 tablet (10 mg total) by mouth 3 (three) times daily as needed for muscle spasms. 60 tablet 0  . doxycycline (VIBRAMYCIN) 100 MG capsule Take 1 capsule (100 mg total) by mouth 2 (two) times daily. 90 capsule 1  . lisinopril-hydrochlorothiazide (PRINZIDE,ZESTORETIC) 20-25 MG tablet Take 1 tablet by mouth daily. 30 tablet 2  . Oxycodone HCl 10 MG TABS Take 3 tablets (30 mg total) by mouth every 4 (four) hours as needed ((score 4 to 6)). 10 tablet 0  . oxymorphone (OPANA ER) 10 MG 12 hr tablet Take 1 tablet (10 mg total) by mouth every 8 (eight) hours. [place]  tablet 0  . meloxicam (MOBIC) 15 MG tablet Take 1 tablet (15 mg total) by mouth daily. (Patient not taking: Reported on 03/18/2017) 30 tablet 3  . sulfamethoxazole-trimethoprim (BACTRIM DS,SEPTRA DS) 800-160 MG tablet Take one tablet po every 12 hours. (Patient not taking: Reported on 03/18/2017) 90 tablet 0   No facility-administered medications prior to visit.      Past Medical History:  Diagnosis Date  . Arthritis    "right knee, hips; lower back"  (11/09/2016)  . Chronic lower back pain   . Dyspnea    [name] exertion  . Hypertension   . Obesity   .  PTSD (post-traumatic stress disorder)       Past Surgical History:  Procedure Laterality Date  . ANTERIOR CERVICAL DECOMP/DISCECTOMY FUSION  2007   C 5, 6 ,7  . BACK SURGERY    . CLOSED REDUCTION WRIST FRACTURE Left 04/01/2015   Procedure: CLOSED REDUCTION WRIST AND CASTING REPAIR LACERATION TO FINGERS TIMES 2;  Surgeon: Dorna Leitz, MD;  Location: [facility];  Service: Orthopedics;  Laterality: Left;  . FEMUR IM NAIL Right 04/01/2015   Procedure: INTRAMEDULLARY (IM) RETROGRADE FEMORAL NAILING ;  Surgeon: Dorna Leitz, MD;  Location: [place];  Service: Orthopedics;  Laterality: Right;  . FRACTURE SURGERY    . HARDWARE REMOVAL Right 10/09/2016   Procedure: Dynamization of Nail Right  femur, removal of Screws and Excision of hematoma cavity right thigh;  Surgeon: Jessy Oto, MD;  Location: [place];  Service: Orthopedics;  Laterality: Right;  . HARDWARE REMOVAL Right 01/25/2017   Procedure: REMOVALOF RIGHT FEMUR INTRAMEDULLARY NAIL AND DISTAL INTERLOCKING SCREW, IRRIGATION AND DEBRIDEMENT RIGHT KNEE;  Surgeon: Jessy Oto, MD;  Location: [place];  Service: Orthopedics;  Laterality: Right;  . HEMATOMA EVACUATION Right 10/09/2016   Procedure: EVACUATION HEMATOMA;  Surgeon: Jessy Oto, MD;  Location: [facility];  Service: Orthopedics;  Laterality: Right;  . KNEE ARTHROSCOPY Right 11/09/2016   I&D  . KNEE ARTHROSCOPY Right 11/09/2016   Procedure: ARTHROSCOPIC IRRIGATION AND DEBRIDEMENT RIGHT KNEE;  Surgeon: Jessy Oto, MD;  Location: [facility];  Service: Orthopedics;  Laterality: Right;  . KNEE ARTHROSCOPY W/ ACL RECONSTRUCTION Right 2001  . LABRAL REPAIR Right 06/2005  . RESECTION DISTAL CLAVICAL Right 2009      Family History  Problem Relation Age of Onset  . Colon cancer Neg Hx       Social History   Socioeconomic History  . Marital status: Single    Spouse name: Not on file  . Number of children: Not on file  . Years of education: Not on file  . Highest education level: Not on file    Social Needs  . Financial resource strain: Not on file  . Food insecurity - worry: Not on file  . Food insecurity - inability: Not on file  . Transportation needs - medical: Not on file  . Transportation needs - non-medical: Not on file  Occupational History  . Not on file  Tobacco Use  . Smoking status: Never Smoker  . Smokeless tobacco: Never Used  Substance and Sexual Activity  . Alcohol use: Yes    Comment: 11/09/2016 "might have 1 shot/year; if that"  . Drug use: No  . Sexual activity: Not Currently  Other Topics Concern  . Not on file  Social History Narrative   ** Merged History Encounter **          Review of Systems  Constitutional: Negative for chills and  fever.  Respiratory: Negative for cough, chest tightness, shortness of breath and wheezing.   Cardiovascular: Negative for chest pain, palpitations and leg swelling.  Musculoskeletal:       Positive for right knee pain.       Objective:    BP (!) 174/103   Pulse 86   Temp 97.7 F (36.5 C) (Oral)   Wt 249 lb (112.9 kg)   BMI 32.41 kg/m  Nursing note and vital signs reviewed.  Physical Exam  Constitutional: Dustin is oriented to person, place, and time. Dustin appears well-developed and well-nourished. No distress.  Cardiovascular: Normal rate, regular rhythm, normal heart sounds and intact distal pulses.  Pulmonary/Chest: Effort normal and breath sounds normal.  Musculoskeletal:  Right knee - mild/moderate edema [name] no significant redness or deformity. Mild tenderness and warmth compared to the contralateral side. Range of motion within normal limits. Distal pulses and sensation are intact and appropriate.  Neurological: Dustin is alert and oriented to person, place, and time.  Skin: Skin is warm and dry.  Psychiatric: Dustin has a normal mood and affect. His behavior is normal. Judgment and thought content normal.        Assessment & Plan:   Problem List Items Addressed This Visit      Cardiovascular and  Mediastinum   Essential hypertension    Poorly controlled hypertension [name] patient not currently taking medications. Encouraged to restart lisinopril-hydrochlorothiazide. Avoid sodium. Monitor blood pressures at home and follow-up [name] PCP. Encouraged to seek emergency care if headache worsens or changes in vision occur.        Musculoskeletal and Integument   Septic arthritis of knee, right ([facility]) - Primary    There remains mild to moderate knee inflammation [name] warmth and no discharge. Tolerating doxycycline [name] no adverse side effects. There is no evidence of systemic infection [name] normal white blood cell count and no fevers. Has follow-up [name] orthopedics on 1/31. Obtain CRP. Continue doxycycline pending follow-up [name] orthopedics. Will likely require additional 1-1/2-2 months of doxycycline.      Relevant Orders   C-reactive protein       I am having Rivka Safer maintain his cyclobenzaprine, aspirin, oxymorphone, lisinopril-hydrochlorothiazide, sulfamethoxazole-trimethoprim, meloxicam, aspirin, doxycycline, and Oxycodone HCl.   Follow-up: Return in about 1 month (around 04/18/2017), or if symptoms worsen or fail to improve.   Mauricio Po, [facility]

## 2017-03-17 ENCOUNTER — Encounter (HOSPITAL_BASED_OUTPATIENT_CLINIC_OR_DEPARTMENT_OTHER): Payer: Self-pay | Admitting: Adult Health

## 2017-03-17 ENCOUNTER — Other Ambulatory Visit: Payer: Self-pay

## 2017-03-17 ENCOUNTER — Emergency Department (HOSPITAL_BASED_OUTPATIENT_CLINIC_OR_DEPARTMENT_OTHER)
Admission: EM | Admit: 2017-03-17 | Discharge: 2017-03-18 | Disposition: A | Discharge status: Home / Self Care | Payer: Medicare HMO | Attending: Emergency Medicine | Admitting: Emergency Medicine

## 2017-03-17 DIAGNOSIS — R51 Headache: Secondary | ICD-10-CM | POA: Diagnosis present

## 2017-03-17 DIAGNOSIS — R519 Headache, unspecified: Secondary | ICD-10-CM

## 2017-03-17 DIAGNOSIS — I10 Essential (primary) hypertension: Secondary | ICD-10-CM | POA: Insufficient documentation

## 2017-03-17 DIAGNOSIS — M79661 Pain in right lower leg: Secondary | ICD-10-CM | POA: Diagnosis not present

## 2017-03-17 DIAGNOSIS — M79604 Pain in right leg: Secondary | ICD-10-CM

## 2017-03-17 DIAGNOSIS — Z7982 Long term (current) use of aspirin: Secondary | ICD-10-CM | POA: Diagnosis not present

## 2017-03-17 DIAGNOSIS — Z79899 Other long term (current) drug therapy: Secondary | ICD-10-CM | POA: Insufficient documentation

## 2017-03-17 DIAGNOSIS — Z91040 Latex allergy status: Secondary | ICD-10-CM | POA: Diagnosis not present

## 2017-03-17 LAB — COMPREHENSIVE METABOLIC PANEL
ALK PHOS: 98 U/L (ref 38–126)
ALT: 17 U/L (ref 17–63)
AST: 22 U/L (ref 15–41)
Albumin: 4.3 g/dL (ref 3.5–5.0)
Anion gap: 9 (ref 5–15)
BILIRUBIN TOTAL: 1.1 mg/dL (ref 0.3–1.2)
BUN: 15 mg/dL (ref 6–20)
CO2: 26 mmol/L (ref 22–32)
CREATININE: 1.09 mg/dL (ref 0.61–1.24)
Calcium: 9.3 mg/dL (ref 8.9–10.3)
Chloride: 103 mmol/L (ref 101–111)
GFR calc Af Amer: >60 mL/min (ref >60)
Glucose, Bld: 110 mg/dL — ABNORMAL HIGH (ref 65–99)
Potassium: 3.8 mmol/L (ref 3.5–5.1)
Sodium: 138 mmol/L (ref 135–145)
TOTAL PROTEIN: 8 g/dL (ref 6.5–8.1)

## 2017-03-17 LAB — CBC WITH DIFFERENTIAL/PLATELET
Basophils Absolute: 0 10*3/uL (ref 0.0–0.1)
Basophils Relative: 0 %
Eosinophils Absolute: 0.1 10*3/uL (ref 0.0–0.7)
Eosinophils Relative: 1 %
HEMATOCRIT: 44.9 % (ref 39.0–52.0)
Hemoglobin: 14.9 g/dL (ref 13.0–17.0)
LYMPHS PCT: 27 %
Lymphs Abs: 1.8 10*3/uL (ref 0.7–4.0)
MCH: 28.5 pg (ref 26.0–34.0)
MCHC: 33.2 g/dL (ref 30.0–36.0)
MCV: 85.9 fL (ref 78.0–100.0)
MONO ABS: 0.6 10*3/uL (ref 0.1–1.0)
Monocytes Relative: 9 %
NEUTROS ABS: 4.2 10*3/uL (ref 1.7–7.7)
Neutrophils Relative %: 63 %
Platelets: 285 10*3/uL (ref 150–400)
RBC: 5.23 MIL/uL (ref 4.22–5.81)
RDW: 14.9 % (ref 11.5–15.5)
WBC: 6.6 10*3/uL (ref 4.0–10.5)

## 2017-03-17 NOTE — ED Triage Notes (Addendum)
Presents with a headache for one week, described as slight. He is concerned his leg infection, from December is traveling. He stopped taking his narcotic pain medication 3 days ago. He also endorses chills. He is afebrile. Endorses nausea after eating. He is very concerned that he has a infection.  Deneis dizziness and lightheaded. Headache is between his eyes, no photophobia or sensitivity to sound.

## 2017-03-18 ENCOUNTER — Encounter: Payer: Self-pay | Admitting: Family Medicine

## 2017-03-18 ENCOUNTER — Ambulatory Visit (INDEPENDENT_AMBULATORY_CARE_PROVIDER_SITE_OTHER): Payer: Medicare HMO | Admitting: Family Medicine

## 2017-03-18 ENCOUNTER — Encounter: Payer: Self-pay | Admitting: Family

## 2017-03-18 ENCOUNTER — Ambulatory Visit (INDEPENDENT_AMBULATORY_CARE_PROVIDER_SITE_OTHER): Payer: Medicare HMO | Admitting: Family

## 2017-03-18 VITALS — BP 162/100 | HR 94 | Temp 98.5°F | Ht 73.5 in | Wt 250.0 lb

## 2017-03-18 VITALS — BP 174/103 | HR 86 | Temp 97.7°F | Wt 249.0 lb

## 2017-03-18 DIAGNOSIS — M00061 Staphylococcal arthritis, right knee: Secondary | ICD-10-CM

## 2017-03-18 DIAGNOSIS — I10 Essential (primary) hypertension: Secondary | ICD-10-CM | POA: Diagnosis not present

## 2017-03-18 MED ORDER — HYDROCHLOROTHIAZIDE 25 MG PO TABS
25.0000 mg | ORAL_TABLET | Freq: Once | ORAL | Status: DC
  Filled 2017-03-18: qty 1

## 2017-03-18 MED ORDER — OXYCODONE-ACETAMINOPHEN 5-325 MG PO TABS
1.0000 | ORAL_TABLET | Freq: Once | ORAL | Status: DC
  Filled 2017-03-18: qty 1

## 2017-03-18 MED ORDER — LISINOPRIL 10 MG PO TABS
20.0000 mg | ORAL_TABLET | Freq: Once | ORAL | Status: DC
  Filled 2017-03-18: qty 2

## 2017-03-18 MED ORDER — KETOROLAC TROMETHAMINE 30 MG/ML IJ SOLN
30.0000 mg | Freq: Once | INTRAMUSCULAR | Status: DC
  Filled 2017-03-18: qty 1

## 2017-03-18 NOTE — ED Provider Notes (Signed)
[facility] EMERGENCY DEPARTMENT Provider Note   CSN: [id number] Arrival date & time: 03/17/17  2147     History   Chief Complaint Chief Complaint  Patient presents with  . Headache    HPI Dustin [name] is a 52 y.o. male.  HPI  This is a 52 year old male with a history of hypertension, chronic osteomyelitis who presents with headache.  Patient reports that he mainly came because of 1 week history of persistent headache.  He states "I think I am infected."  Patient reports that he had hardware removed from his right lower extremity in December.  Since that time he has been on antibiotics.  He reports unchanged right lower extremity pain.  Over the last week he has had headache.  Denies neck stiffness or fevers.  Does report chills.  Patient has not taken any medications aside from his antibiotic over the last 3 days because "I did not know if it would make a difference."  He has not taken his blood pressure medications or pain medications.  When asked if he took anything for his headache, he stated "no."  Currently he rates his pain at 9 out of 10.  Regarding headache, he denies any vision changes, weakness, numbness, tingling.  He is currently taking doxycycline and is followed by infectious disease.  Past Medical History:  Diagnosis Date  . Arthritis    "right knee, hips; lower back"  (11/09/2016)  . Chronic lower back pain   . Dyspnea    with exertion  . Hypertension   . Obesity   . PTSD (post-traumatic stress disorder)     Patient Active Problem List   Diagnosis Date Noted  . Chronic osteomyelitis of right femur ([place]) 01/29/2017    Class: Chronic  . S/P hardware removal 01/25/2017  . Effusion of right knee joint 01/09/2017  . Chronic pain syndrome 11/27/2016    Class: Chronic  . Septic arthritis of knee, right ([place]) 11/09/2016    Class: Acute  . Effusion of right knee 11/08/2016  . 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  Retained orthopedic hardware 09/11/2016    Class: Chronic  . Thigh hematoma, right, sequela 01/23/2016  . Essential hypertension 12/15/2015  . Distal radius fracture, left 04/02/2015  . Closed fracture of right femur ([place]) 04/01/2015    Past Surgical History:  Procedure Laterality Date  . ANTERIOR CERVICAL DECOMP/DISCECTOMY FUSION  2007   C 5, 6 ,7  . BACK SURGERY    . CLOSED REDUCTION WRIST FRACTURE Left 04/01/2015   Procedure: CLOSED REDUCTION WRIST AND CASTING REPAIR LACERATION TO FINGERS TIMES 2;  Surgeon: Dorna Leitz, MD;  Location: [facility];  Service: Orthopedics;  Laterality: Left;  . FEMUR IM NAIL Right 04/01/2015   Procedure: INTRAMEDULLARY (IM) RETROGRADE FEMORAL NAILING ;  Surgeon: Dorna Leitz, MD;  Location: [facility];  Service: Orthopedics;  Laterality: Right;  . FRACTURE SURGERY    . HARDWARE REMOVAL Right 10/09/2016   Procedure: Dynamization of Nail Right  femur, removal of Screws and Excision of hematoma cavity right thigh;  Surgeon: Jessy Oto, MD;  Location: [facility];  Service: Orthopedics;  Laterality: Right;  . HARDWARE REMOVAL Right 01/25/2017   Procedure: REMOVALOF RIGHT FEMUR INTRAMEDULLARY NAIL AND DISTAL INTERLOCKING SCREW, IRRIGATION AND DEBRIDEMENT RIGHT KNEE;  Surgeon: Jessy Oto, MD;  Location: [facility];  Service: Orthopedics;  Laterality: Right;  . HEMATOMA EVACUATION Right 10/09/2016   Procedure: EVACUATION HEMATOMA;  Surgeon: Louanne Skye,  Daleen Bo, MD;  Location: [facility];  Service: Orthopedics;  Laterality: Right;  . KNEE ARTHROSCOPY Right 11/09/2016   I&D  . KNEE ARTHROSCOPY Right 11/09/2016   Procedure: ARTHROSCOPIC IRRIGATION AND DEBRIDEMENT RIGHT KNEE;  Surgeon: Jessy Oto, MD;  Location: [facility];  Service: Orthopedics;  Laterality: Right;  . KNEE ARTHROSCOPY W/ ACL RECONSTRUCTION Right 2001  . LABRAL REPAIR Right 06/2005  . RESECTION DISTAL CLAVICAL Right 2009       Home Medications    Prior to Admission medications   Medication Sig Start Date End Date  Taking? Authorizing Provider  aspirin EC 325 MG EC tablet Take 1 tablet (325 mg total) by mouth 2 (two) times daily after a meal. Patient taking differently: Take 325 mg by mouth daily.  11/16/16   Jessy Oto, MD  aspirin EC 325 MG EC tablet Take 1 tablet (325 mg total) by mouth 2 (two) times daily after a meal. 01/30/17   Jessy Oto, MD  cyclobenzaprine (FLEXERIL) 10 MG tablet Take 1 tablet (10 mg total) by mouth 3 (three) times daily as needed for muscle spasms. 10/10/16   Jessy Oto, MD  doxycycline (VIBRAMYCIN) 100 MG capsule Take 1 capsule (100 mg total) by mouth 2 (two) times daily. 01/30/17   Jessy Oto, MD  lisinopril-hydrochlorothiazide (PRINZIDE,ZESTORETIC) 20-25 MG tablet Take 1 tablet by mouth daily. 11/28/16   Shelda Pal, DO  meloxicam (MOBIC) 15 MG tablet Take 1 tablet (15 mg total) by mouth daily. 12/11/16   Jessy Oto, MD  Oxycodone HCl 10 MG TABS Take 3 tablets (30 mg total) by mouth every 4 (four) hours as needed ((score 4 to 6)). 02/21/17   Jessy Oto, MD  oxymorphone (OPANA ER) 10 MG 12 hr tablet Take 1 tablet (10 mg total) by mouth every 8 (eight) hours. 11/27/16   Jessy Oto, MD  sulfamethoxazole-trimethoprim (BACTRIM DS,SEPTRA DS) 800-160 MG tablet Take one tablet po every 12 hours. Patient taking differently: Take 1 tablet by mouth 2 (two) times daily. Take one tablet po every 12 hours. 12/11/16   Jessy Oto, MD    Family History Family History  Problem Relation Age of Onset  . Colon cancer Neg Hx     Social History Social History   Tobacco Use  . Smoking status: Never Smoker  . Smokeless tobacco: Never Used  Substance Use Topics  . Alcohol use: Yes    Comment: 11/09/2016 "might have 1 shot/year; if that"  . Drug use: No     Allergies   Latex and Mobic [meloxicam]   Review of Systems Review of Systems  Constitutional: Positive for chills. Negative for fever.  Respiratory: Negative for shortness of breath.     Cardiovascular: Negative for chest pain.  Gastrointestinal: Positive for nausea. Negative for abdominal pain and vomiting.  Musculoskeletal:       Right leg pain  Skin: Negative for color change, rash and wound.  Neurological: Positive for headaches. Negative for weakness and numbness.  All other systems reviewed and are negative.    Physical Exam Updated Vital Signs BP (!) 167/113   Pulse 83   Temp 98.2 F (36.8 C) (Oral)   Resp 20   Ht 6' 1.5" (1.867 m)   Wt 113.4 kg (250 lb)   SpO2 100%   BMI 32.54 kg/m   Physical Exam  Constitutional: He is oriented to person, place, and time. He appears well-developed and well-nourished.  Overweight, no acute distress  HENT:  Head: Normocephalic and atraumatic.  Eyes: Pupils are equal, round, and reactive to light.  Neck: Normal range of motion. Neck supple.  Cardiovascular: Normal rate, regular rhythm and normal heart sounds.  No murmur heard. Pulmonary/Chest: Effort normal and breath sounds normal. No respiratory distress. He has no wheezes.  Abdominal: Soft. Bowel sounds are normal. There is no tenderness. There is no rebound.  Musculoskeletal: He exhibits no edema.  Scarring noted to the right proximal leg and right knee, normal range of motion of the right hip and knee, no overlying skin changes, effusion  Lymphadenopathy:    He has no cervical adenopathy.  Neurological: He is alert and oriented to person, place, and time.  5 out of 5 strength in all 4 extremities, no dysmetria to finger-nose-finger, cranial nerves II through XII intact  Skin: Skin is warm and dry.  Psychiatric: He has a normal mood and affect.  Nursing note and vitals reviewed.    ED Treatments / Results  Labs (all labs ordered are listed, but only abnormal results are displayed) Labs Reviewed  COMPREHENSIVE METABOLIC PANEL - Abnormal; Notable for the following components:      Result Value   Glucose, Bld 110 (*)    All other components within normal  limits  CBC WITH DIFFERENTIAL/PLATELET    EKG  EKG Interpretation None       Radiology No results found.  Procedures Procedures (including critical care time)  Medications Ordered in ED Medications  lisinopril (PRINIVIL,ZESTRIL) tablet 20 mg (20 mg Oral Not Given 03/18/17 0036)  hydrochlorothiazide (HYDRODIURIL) tablet 25 mg (25 mg Oral Not Given 03/18/17 0037)  oxyCODONE-acetaminophen (PERCOCET/ROXICET) 5-325 MG per tablet 1 tablet (1 tablet Oral Not Given 03/18/17 0037)  ketorolac (TORADOL) 30 MG/ML injection 30 mg (30 mg Intramuscular Not Given 03/18/17 0038)     Initial Impression / Assessment and Plan / ED Course  I have reviewed the triage vital signs and the nursing notes.  Pertinent labs & imaging results that were available during my care of the patient were reviewed by me and considered in my medical decision making (see chart for details).     Patient presents with primary complaint of headache.  Also reports right lower leg pain and is concerned he may have infection.  He takes doxycycline chronically for chronic osteomyelitis.  He is afebrile and vital signs notable for blood pressure 167/113.  He has not taken his blood pressure medications.  Lab work sent from triage and reviewed.  Normal white count and labs are at baseline.  No indications of active infection at this time.  Suspect his headache may be related to elevated blood pressure.  He is nonfocal.  Patient was given his blood pressure medications.  He was also given oxycodone for leg pain and Toradol.  Doubt subarachnoid hemorrhage or meningitis.  1:27 AM On reexam, patient states he feels much better and "I am ready to go home."  Recommend follow-up with orthopedist and primary physician.  After history, exam, and medical workup I feel the patient has been appropriately medically screened and is safe for discharge home. Pertinent diagnoses were discussed with the patient. Patient was given return  precautions.  Final Clinical Impressions(s) / ED Diagnoses   Final diagnoses:  Bad headache  Pain of right lower extremity  Essential hypertension    ED Discharge Orders    None       Merryl Hacker, MD 03/18/17 0128

## 2017-03-18 NOTE — ED Notes (Signed)
Pt refused to take any medicines.  He stated that he just wanted to know if he has any infection going on.  He stated that he has all the medicine that he needed at home.

## 2017-03-18 NOTE — Assessment & Plan Note (Signed)
Poorly controlled hypertension with patient not currently taking medications. Encouraged to restart lisinopril-hydrochlorothiazide. Avoid sodium. Monitor blood pressures at home and follow-up with PCP. Encouraged to seek emergency care if headache worsens or changes in vision occur.

## 2017-03-18 NOTE — ED Notes (Signed)
ED Provider at bedside. 

## 2017-03-18 NOTE — Progress Notes (Signed)
Chief Complaint  Patient presents with  . Follow-up    Right leg pain and swelling    Subjective: Patient is a 52 y.o. male here for discussion of getting a letter.  Pt has been having issues with a septic joint after hardware placement in his R knee. He has been on doxy and following with both ID and OS. His next appt with ortho is on 1/31. He would like a letter excusing him from school activities from 1/21-1/31 since he cannot see his ortho team until then. Saw ID this AM, no changes in abx. No fevers.   Monitors bp at home, stopped meds due to thinking it was contributing to worsening   ROS: Const: no fevers  Family History  Problem Relation Age of Onset  . Colon cancer Neg Hx    Past Medical History:  Diagnosis Date  . Arthritis    "right knee, hips; lower back"  (11/09/2016)  . Chronic lower back pain   . Dyspnea    with exertion  . Hypertension   . Obesity   . PTSD (post-traumatic stress disorder)    Allergies  Allergen Reactions  . Latex Rash and Swelling  . Mobic [Meloxicam] Nausea Only    Current Outpatient Medications:  .  aspirin EC 325 MG EC tablet, Take 1 tablet (325 mg total) by mouth 2 (two) times daily after a meal. (Patient taking differently: Take 325 mg by mouth daily. ), Disp: 60 tablet, Rfl: 0 .  aspirin EC 325 MG EC tablet, Take 1 tablet (325 mg total) by mouth 2 (two) times daily after a meal., Disp: 60 tablet, Rfl: 0 .  cyclobenzaprine (FLEXERIL) 10 MG tablet, Take 1 tablet (10 mg total) by mouth 3 (three) times daily as needed for muscle spasms., Disp: 60 tablet, Rfl: 0 .  doxycycline (VIBRAMYCIN) 100 MG capsule, Take 1 capsule (100 mg total) by mouth 2 (two) times daily., Disp: 90 capsule, Rfl: 1 .  lisinopril-hydrochlorothiazide (PRINZIDE,ZESTORETIC) 20-25 MG tablet, Take 1 tablet by mouth daily., Disp: 30 tablet, Rfl: 2 .  oxymorphone (OPANA ER) 10 MG 12 hr tablet, Take 1 tablet (10 mg total) by mouth every 8 (eight) hours., Disp: 90 tablet, Rfl:  0  Objective: BP (!) 162/100 (BP Location: Left Arm, Patient Position: Sitting, Cuff Size: Large)   Pulse 94   Temp 98.5 F (36.9 C) (Oral)   Ht 6' 1.5" (1.867 m)   Wt 250 lb (113.4 kg)   SpO2 98%   BMI 32.54 kg/m  General: Awake, appears stated age Lungs: No accessory muscle use MSK: antalgic gait Psych: Age appropriate judgment and insight, normal affect and mood  Assessment and Plan: Staphylococcal arthritis of right knee [facility])  Essential hypertension  Letter written to bridge him until he can get into ortho.  F/u if home bp readings do not continue to come down. Today's reading is lower than recent previous readings since he started back on his bp meds.  The patient voiced understanding and agreement to the plan.  [place], DO 03/18/17  2:53 PM

## 2017-03-18 NOTE — Discharge Instructions (Signed)
You were seen today for headache.  This may be related to not taking her blood pressure medications and elevated blood pressure over the last several days.  Make sure to take your medications at home as directed.  Follow-up with your orthopedist regarding your right leg pain.

## 2017-03-18 NOTE — Progress Notes (Signed)
Pre visit review using our clinic review tool, if applicable. No additional management support is needed unless otherwise documented below in the visit note. 

## 2017-03-18 NOTE — Patient Instructions (Signed)
Nice to meet you!  Please continue to take the doxycycline.  We will follow up in 1 month or sooner if needed.  Continue to follow up with Dr. Louanne Skye.  Please continue to take your blood pressure medications and watch out for salt!

## 2017-03-18 NOTE — Assessment & Plan Note (Addendum)
There remains mild to moderate knee inflammation with warmth and no discharge. Tolerating doxycycline with no adverse side effects. There is no evidence of systemic infection with normal white blood cell count and no fevers. Has follow-up with orthopedics on 1/31. Obtain CRP. Continue doxycycline pending follow-up with orthopedics. Will likely require additional 1-1/2-2 months of doxycycline.

## 2017-03-19 ENCOUNTER — Encounter: Payer: Self-pay | Admitting: Family

## 2017-03-19 LAB — C-REACTIVE PROTEIN: CRP: 1.3 mg/L (ref <8.0)

## 2017-03-21 ENCOUNTER — Ambulatory Visit (INDEPENDENT_AMBULATORY_CARE_PROVIDER_SITE_OTHER): Payer: Medicare HMO | Admitting: Specialist

## 2017-03-21 ENCOUNTER — Encounter (INDEPENDENT_AMBULATORY_CARE_PROVIDER_SITE_OTHER): Payer: Self-pay | Admitting: Specialist

## 2017-03-21 ENCOUNTER — Ambulatory Visit (INDEPENDENT_AMBULATORY_CARE_PROVIDER_SITE_OTHER): Payer: Medicare HMO

## 2017-03-21 VITALS — BP 169/102 | HR 102 | Ht 73.5 in | Wt 252.0 lb

## 2017-03-21 DIAGNOSIS — M86651 Other chronic osteomyelitis, right thigh: Secondary | ICD-10-CM

## 2017-03-21 DIAGNOSIS — Z9889 Other specified postprocedural states: Secondary | ICD-10-CM

## 2017-03-21 NOTE — Progress Notes (Signed)
Post-Op Visit Note   Patient: Dustin [name]           Date of Birth: 1965/08/20           MRN: [id number] Visit Date: 03/21/2017 PCP: Shelda Pal, DO   Assessment & Plan: 1 1/2 months post op I&D of right knee and right femur For resection of IM nail.  Chief Complaint:  Chief Complaint  Patient presents with  . Right Leg - Follow-up  His incisions are healed, no erythrema or drainage. Normal warmth of the legs. RIght knee full extension 0 -125 degrees. His lab is improving according to ID CRP is decreased to 1.3 from 13.3 Visit Diagnoses:  1. Chronic osteomyelitis of right femur ([place])   2. History of removal of retained hardware     Plan: May walk for exercise up to 20-30 minutes per day. Avoid jogging or high impact aerobic exercises.  May take aspirin for another month then may discontinue  Follow-Up Instructions: No Follow-up on file.   Orders:  Orders Placed This Encounter  Procedures  . XR FEMUR, MIN 2 VIEWS RIGHT   No orders of the defined types were placed in this encounter.   Imaging: No results found.  PMFS History: Patient Active Problem List   Diagnosis Date Noted  . Chronic osteomyelitis of right femur ([place]) 01/29/2017    Priority: High    Class: Chronic  . Chronic pain syndrome 11/27/2016    Priority: High    Class: Chronic  . Traumatic seroma of thigh, right, sequela 09/11/2016    Priority: Low    Class: Chronic  . Retained orthopedic hardware 09/11/2016    Priority: Low    Class: Chronic  . S/P hardware removal 01/25/2017  . Effusion of right knee joint 01/09/2017  . Septic arthritis of knee, right ([place]) 11/09/2016    Class: Acute  . Effusion of right knee 11/08/2016  . Thigh hematoma, right, sequela 01/23/2016  . Essential hypertension 12/15/2015  . Distal radius fracture, left 04/02/2015  . Closed fracture of right femur ([place]) 04/01/2015   Past Medical History:  Diagnosis Date  . Arthritis    "right knee, hips; lower  back"  (11/09/2016)  . Chronic lower back pain   . Dyspnea    with exertion  . Hypertension   . Obesity   . PTSD (post-traumatic stress disorder)     Family History  Problem Relation Age of Onset  . Colon cancer Neg Hx     Past Surgical History:  Procedure Laterality Date  . ANTERIOR CERVICAL DECOMP/DISCECTOMY FUSION  2007   C 5, 6 ,7  . BACK SURGERY    . CLOSED REDUCTION WRIST FRACTURE Left 04/01/2015   Procedure: CLOSED REDUCTION WRIST AND CASTING REPAIR LACERATION TO FINGERS TIMES 2;  Surgeon: Dorna Leitz, MD;  Location: [facility];  Service: Orthopedics;  Laterality: Left;  . FEMUR IM NAIL Right 04/01/2015   Procedure: INTRAMEDULLARY (IM) RETROGRADE FEMORAL NAILING ;  Surgeon: Dorna Leitz, MD;  Location: [facility];  Service: Orthopedics;  Laterality: Right;  . FRACTURE SURGERY    . HARDWARE REMOVAL Right 10/09/2016   Procedure: Dynamization of Nail Right  femur, removal of Screws and Excision of hematoma cavity right thigh;  Surgeon: Jessy Oto, MD;  Location: [facility];  Service: Orthopedics;  Laterality: Right;  . HARDWARE REMOVAL Right 01/25/2017   Procedure: REMOVALOF RIGHT FEMUR INTRAMEDULLARY NAIL AND DISTAL INTERLOCKING SCREW, IRRIGATION AND DEBRIDEMENT RIGHT KNEE;  Surgeon: Jessy Oto, MD;  Location: [facility];  Service: Orthopedics;  Laterality: Right;  . HEMATOMA EVACUATION Right 10/09/2016   Procedure: EVACUATION HEMATOMA;  Surgeon: Jessy Oto, MD;  Location: [facility];  Service: Orthopedics;  Laterality: Right;  . KNEE ARTHROSCOPY Right 11/09/2016   I&D  . KNEE ARTHROSCOPY Right 11/09/2016   Procedure: ARTHROSCOPIC IRRIGATION AND DEBRIDEMENT RIGHT KNEE;  Surgeon: Jessy Oto, MD;  Location: [facility];  Service: Orthopedics;  Laterality: Right;  . KNEE ARTHROSCOPY W/ ACL RECONSTRUCTION Right 2001  . LABRAL REPAIR Right 06/2005  . RESECTION DISTAL CLAVICAL Right 2009   Social History   Occupational History  . Not on file  Tobacco Use  . Smoking status: Never Smoker  . Smokeless  tobacco: Never Used  Substance and Sexual Activity  . Alcohol use: Yes    Comment: 11/09/2016 "might have 1 shot/year; if that"  . Drug use: No  . Sexual activity: Not Currently

## 2017-03-21 NOTE — Patient Instructions (Signed)
May walk for exercise up to 20-30 minutes per day. Avoid jogging or high impact aerobic exercises.  May take aspirin for another month then may discontinue.

## 2017-03-28 ENCOUNTER — Ambulatory Visit (HOSPITAL_BASED_OUTPATIENT_CLINIC_OR_DEPARTMENT_OTHER): Admit: 2017-03-28 | Payer: Medicare HMO

## 2017-03-28 ENCOUNTER — Encounter (HOSPITAL_BASED_OUTPATIENT_CLINIC_OR_DEPARTMENT_OTHER): Payer: Self-pay | Admitting: Emergency Medicine

## 2017-03-28 ENCOUNTER — Emergency Department (HOSPITAL_BASED_OUTPATIENT_CLINIC_OR_DEPARTMENT_OTHER)
Admission: EM | Admit: 2017-03-28 | Discharge: 2017-03-28 | Disposition: A | Discharge status: Home / Self Care | Payer: Medicare HMO | Attending: Emergency Medicine | Admitting: Emergency Medicine

## 2017-03-28 ENCOUNTER — Other Ambulatory Visit: Payer: Self-pay

## 2017-03-28 DIAGNOSIS — I10 Essential (primary) hypertension: Secondary | ICD-10-CM | POA: Insufficient documentation

## 2017-03-28 DIAGNOSIS — M79604 Pain in right leg: Secondary | ICD-10-CM | POA: Diagnosis not present

## 2017-03-28 DIAGNOSIS — Z79899 Other long term (current) drug therapy: Secondary | ICD-10-CM | POA: Diagnosis not present

## 2017-03-28 DIAGNOSIS — Z91040 Latex allergy status: Secondary | ICD-10-CM | POA: Diagnosis not present

## 2017-03-28 DIAGNOSIS — G8929 Other chronic pain: Secondary | ICD-10-CM | POA: Insufficient documentation

## 2017-03-28 DIAGNOSIS — Z7982 Long term (current) use of aspirin: Secondary | ICD-10-CM | POA: Diagnosis not present

## 2017-03-28 DIAGNOSIS — R52 Pain, unspecified: Secondary | ICD-10-CM

## 2017-03-28 DIAGNOSIS — R361 Hematospermia: Secondary | ICD-10-CM | POA: Diagnosis present

## 2017-03-28 HISTORY — DX: Other chronic osteomyelitis, right thigh: M86.651

## 2017-03-28 HISTORY — DX: Chronic pain syndrome: G89.4

## 2017-03-28 LAB — URINALYSIS, ROUTINE W REFLEX MICROSCOPIC
BILIRUBIN URINE: NEGATIVE
Glucose, UA: NEGATIVE mg/dL
KETONES UR: 15 mg/dL — AB
Leukocytes, UA: NEGATIVE
NITRITE: NEGATIVE
Protein, ur: NEGATIVE mg/dL
pH: 5.5 (ref 5.0–8.0)

## 2017-03-28 LAB — URINALYSIS, MICROSCOPIC (REFLEX)

## 2017-03-28 LAB — GC/CHLAMYDIA PROBE AMP (~~LOC~~) NOT AT ARMC
CHLAMYDIA, DNA PROBE: NEGATIVE
Neisseria Gonorrhea: NEGATIVE

## 2017-03-28 MED ORDER — IBUPROFEN 800 MG PO TABS
800.0000 mg | ORAL_TABLET | Freq: Once | ORAL | Status: AC
  Administered 2017-03-28: 800 mg via ORAL
  Filled 2017-03-28: qty 1

## 2017-03-28 MED ORDER — DICLOFENAC SODIUM 1 % TD GEL: 4.0000 g | Freq: Four times a day (QID) | TRANSDERMAL | 0 refills | Status: AC

## 2017-03-28 NOTE — ED Provider Notes (Signed)
[facility] EMERGENCY DEPARTMENT Provider Note   CSN: [id number] Arrival date & time: 03/28/17  0247     History   Chief Complaint Chief Complaint  Patient presents with  . Male GU Problem  . Leg Pain    HPI Dustin [name] is a 52 y.o. male.  The history is provided by the patient.  Male GU Problem  Primary symptoms include no dysuria, no genital itching, no genital lesions, no penile pain. Primary symptoms comment: hematospermia for several weeks. This is a new problem. The current episode started more than 1 week ago. The problem occurs constantly. The problem has not changed since onset.Context: only with ejaculation. The discharge is bloody. Pertinent negatives include no anorexia. There has been no fever. He has tried nothing for the symptoms. The treatment provided no relief. Partner displays symptoms of an STD: no. Associated medical issues do not include gonorrhea or chlamydia.  Leg Pain   This is a chronic problem. The current episode started more than 1 week ago. The problem occurs constantly. The problem has not changed since onset.The pain is present in the right knee. The quality of the pain is described as aching. The pain is severe. Pertinent negatives include no stiffness. The symptoms are aggravated by activity. Treatments tried: multiple narcotics and doxycyline  The treatment provided no relief. Family history is significant for no gout. chronic osteomyelitis  Patient reports right knee pain and swelling for many months with h/o chronic osteomyelitis and several weeks of blood inhis ejaculate and not urine.    Past Medical History:  Diagnosis Date  . Arthritis    "right knee, hips; lower back"  (11/09/2016)  . Chronic lower back pain   . Chronic osteomyelitis of right femur ([place])   . Chronic pain syndrome   . Dyspnea    with exertion  . Hypertension   . Obesity   . PTSD (post-traumatic stress disorder)     Patient Active Problem List   Diagnosis  Date Noted  . Chronic osteomyelitis of right femur ([place]) 01/29/2017    Class: Chronic  . S/P hardware removal 01/25/2017  . Effusion of right knee joint 01/09/2017  . Chronic pain syndrome 11/27/2016    Class: Chronic  . Septic arthritis of knee, right ([place]) 11/09/2016    Class: Acute  . Effusion of right knee 11/08/2016  . 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 Retained orthopedic hardware 09/11/2016    Class: Chronic  . Thigh hematoma, right, sequela 01/23/2016  . Essential hypertension 12/15/2015  . Distal radius fracture, left 04/02/2015  . Closed fracture of right femur ([place]) 04/01/2015    Past Surgical History:  Procedure Laterality Date  . ANTERIOR CERVICAL DECOMP/DISCECTOMY FUSION  2007   C 5, 6 ,7  . BACK SURGERY    . CLOSED REDUCTION WRIST FRACTURE Left 04/01/2015   Procedure: CLOSED REDUCTION WRIST AND CASTING REPAIR LACERATION TO FINGERS TIMES 2;  Surgeon: Dorna Leitz, MD;  Location: [facility];  Service: Orthopedics;  Laterality: Left;  . FEMUR IM NAIL Right 04/01/2015   Procedure: INTRAMEDULLARY (IM) RETROGRADE FEMORAL NAILING ;  Surgeon: Dorna Leitz, MD;  Location: [place];  Service: Orthopedics;  Laterality: Right;  . FRACTURE SURGERY    . HARDWARE REMOVAL Right 10/09/2016   Procedure: Dynamization of Nail Right  femur, removal of Screws and Excision of hematoma cavity right thigh;  Surgeon: Jessy Oto, MD;  Location: [facility];  Service: Orthopedics;  Laterality: Right;  . HARDWARE REMOVAL Right 01/25/2017   Procedure: REMOVALOF RIGHT FEMUR INTRAMEDULLARY NAIL AND DISTAL INTERLOCKING SCREW, IRRIGATION AND DEBRIDEMENT RIGHT KNEE;  Surgeon: Jessy Oto, MD;  Location: [place];  Service: Orthopedics;  Laterality: Right;  . HEMATOMA EVACUATION Right 10/09/2016   Procedure: EVACUATION HEMATOMA;  Surgeon: Jessy Oto, MD;  Location: [facility];  Service: Orthopedics;  Laterality: Right;  . KNEE ARTHROSCOPY Right 11/09/2016   I&D  . KNEE  ARTHROSCOPY Right 11/09/2016   Procedure: ARTHROSCOPIC IRRIGATION AND DEBRIDEMENT RIGHT KNEE;  Surgeon: Jessy Oto, MD;  Location: [facility];  Service: Orthopedics;  Laterality: Right;  . KNEE ARTHROSCOPY W/ ACL RECONSTRUCTION Right 2001  . LABRAL REPAIR Right 06/2005  . RESECTION DISTAL CLAVICAL Right 2009       Home Medications    Prior to Admission medications   Medication Sig Start Date End Date Taking? Authorizing Provider  aspirin EC 325 MG EC tablet Take 1 tablet (325 mg total) by mouth 2 (two) times daily after a meal. Patient taking differently: Take 325 mg by mouth daily.  11/16/16   Jessy Oto, MD  aspirin EC 325 MG EC tablet Take 1 tablet (325 mg total) by mouth 2 (two) times daily after a meal. 01/30/17   Jessy Oto, MD  cyclobenzaprine (FLEXERIL) 10 MG tablet Take 1 tablet (10 mg total) by mouth 3 (three) times daily as needed for muscle spasms. 10/10/16   Jessy Oto, MD  diclofenac sodium (VOLTAREN) 1 % GEL Apply 4 g topically 4 (four) times daily. 03/28/17   Jayvon Mounger, MD  doxycycline (VIBRAMYCIN) 100 MG capsule Take 1 capsule (100 mg total) by mouth 2 (two) times daily. 01/30/17   Jessy Oto, MD  lisinopril-hydrochlorothiazide (PRINZIDE,ZESTORETIC) 20-25 MG tablet Take 1 tablet by mouth daily. 11/28/16   Shelda Pal, DO  oxymorphone (OPANA ER) 10 MG 12 hr tablet Take 1 tablet (10 mg total) by mouth every 8 (eight) hours. 11/27/16   Jessy Oto, MD    Family History Family History  Problem Relation Age of Onset  . Colon cancer Neg Hx     Social History Social History   Tobacco Use  . Smoking status: Never Smoker  . Smokeless tobacco: Never Used  Substance Use Topics  . Alcohol use: Yes    Comment: 11/09/2016 "might have 1 shot/year; if that"  . Drug use: No     Allergies   Latex and Mobic [meloxicam]   Review of Systems Review of Systems  Constitutional: Negative for fever.  Respiratory: Negative for shortness of breath.     Cardiovascular: Negative for chest pain.  Gastrointestinal: Negative for anorexia.  Genitourinary: Negative for discharge, dysuria, penile pain, penile swelling, scrotal swelling and testicular pain.  Musculoskeletal: Positive for arthralgias. Negative for stiffness.  All other systems reviewed and are negative.    Physical Exam Updated Vital Signs Pulse 85   Temp 98 F (36.7 C) (Oral)   Resp 18   Ht 6' 1.5" (1.867 m)   Wt 114.3 kg (252 lb)   SpO2 97%   BMI 32.80 kg/m   Physical Exam  Constitutional: He is oriented to person, place, and time. He appears well-developed and well-nourished. No distress.  HENT:  Head: Normocephalic and atraumatic.  Mouth/Throat: No oropharyngeal exudate.  Eyes: Conjunctivae are normal. Pupils are equal, round, and reactive to light.  Neck: Normal range of motion. Neck supple.  Cardiovascular: Normal rate, regular rhythm, normal heart sounds  and intact distal pulses.  Pulmonary/Chest: Effort normal and breath sounds normal. No stridor. He has no wheezes. He has no rales.  Abdominal: Soft. Bowel sounds are normal. He exhibits no mass. There is no tenderness. There is no rebound and no guarding.  Genitourinary:  Genitourinary Comments: Chaperone present.  No penile pain swelling lesions.  No scrotal swelling or pain.  Intact cremasteric reflex ,no hernias  Musculoskeletal: Normal range of motion. He exhibits no edema, tenderness or deformity.  Negative Homan's sign  Neurological: He is alert and oriented to person, place, and time. He displays normal reflexes.  Skin: Skin is warm and dry. Capillary refill takes less than 2 seconds.  Psychiatric: He has a normal mood and affect.     ED Treatments / Results  Labs (all labs ordered are listed, but only abnormal results are displayed) Labs Reviewed  URINALYSIS, ROUTINE W REFLEX MICROSCOPIC - Abnormal; Notable for the following components:      Result Value   Specific Gravity, Urine >1.030 (*)     Hgb urine dipstick SMALL (*)    Ketones, ur 15 (*)    All other components within normal limits  URINALYSIS, MICROSCOPIC (REFLEX) - Abnormal; Notable for the following components:   Bacteria, UA FEW (*)    Squamous Epithelial / LPF 0-5 (*)    All other components within normal limits  GC/CHLAMYDIA PROBE AMP ([place]) NOT AT [facility]     Procedures Procedures (including critical care time)  Medications Ordered in ED Medications  ibuprofen (ADVIL,MOTRIN) tablet 800 mg (800 mg Oral Given 03/28/17 0444)      Final Clinical Impressions(s) / ED Diagnoses  Patient wants an MRI and a DVT study.  He will need to follow up with his orthopedic surgeon for this test as this is a chronic problem.  I will refer patient to urology for ongoing hematospermia.  Have ordered an outpatient DVT study.   Return for weakness, numbness, changes in vision or speech,  fevers > 100.4 unrelieved by medication, shortness of breath, intractable vomiting, or diarrhea, abdominal pain, Inability to tolerate liquids or food, cough, altered mental status or any concerns. No signs of systemic illness or infection. The patient is nontoxic-appearing on exam and vital signs are within normal limits.    I have reviewed the triage vital signs and the nursing notes. Pertinent labs &imaging results that were available during my care of the patient were reviewed by me and considered in my medical decision making (see chart for details).  After history, exam, and medical workup I feel the patient has been appropriately medically screened and is safe for discharge home. Pertinent diagnoses were discussed with the patient. Patient was given return precautions. Final diagnoses:  Pain of right lower extremity  Hematospermia    ED Discharge Orders        Ordered    [place] LE VEN REFLUX RIGHT  Status:  Canceled     03/28/17 0426    diclofenac sodium (VOLTAREN) 1 % GEL  4 times daily     03/28/17 0426    [place] LE VEN REFLUX RIGHT       03/28/17 0426       Yasmine Kilbourne, MD 03/28/17 6503

## 2017-03-28 NOTE — ED Triage Notes (Signed)
Pt ambulatory without difficulty. Pt was seen at ortho office for right leg chronic osteomyelitis 03/21/17

## 2017-03-28 NOTE — ED Triage Notes (Signed)
Pt reports blood in semen x 1 week and c/o pain in right leg. Pt has chronic pain in right femur but wants to be checked for blood clots tonight.

## 2017-03-29 ENCOUNTER — Telehealth (INDEPENDENT_AMBULATORY_CARE_PROVIDER_SITE_OTHER): Payer: Self-pay | Admitting: Specialist

## 2017-03-29 NOTE — Telephone Encounter (Signed)
Patient came in requesting a cd with all his xrays. CB # [id number]

## 2017-04-01 NOTE — Telephone Encounter (Signed)
Done. Patient aware CD will be ready at front desk.

## 2017-04-02 ENCOUNTER — Ambulatory Visit: Payer: Self-pay | Admitting: *Deleted

## 2017-04-02 NOTE — Telephone Encounter (Signed)
Pt called with the complaint of blood in his semen whenever he ejaculates. He states that this has been going on for a while and the blood was light now it is a dark red. He denies pain or any swelling in his scrotum. He is afebrile.  According to the protocol, appointment made within 3 days.  Home care advice given to him with verbal understanding.  Reason for Disposition . Blood in semen  Answer Assessment - Initial Assessment Questions 1. SYMPTOM: "What's the main symptom you're concerned about?" (e.g., discharge from penis, rash, pain, itching, swelling)     Blood in semen 2. LOCATION: "Where is the _______ located?"     penis 3. ONSET: "When did ________  start?"     25th of January 4. PAIN: "Is there any pain?" If so, ask: "How bad is it?"  (Scale 1-10; or mild, moderate, severe)     no 5. URINE: "Any difficulty passing urine?" If so, ask: "When was the last time?"     no 6. CAUSE: "What do you think is causing the symptoms?"     Not sure 7. OTHER SYMPTOMS: "Do you have any other symptoms?" (e.g., fever, abdominal pain, blood in urine)     no  Protocols used: PENIS AND SCROTUM [facility]

## 2017-04-03 ENCOUNTER — Ambulatory Visit: Payer: Medicare HMO | Admitting: Family Medicine

## 2017-04-04 ENCOUNTER — Ambulatory Visit (INDEPENDENT_AMBULATORY_CARE_PROVIDER_SITE_OTHER): Payer: Medicare HMO | Admitting: Medical

## 2017-04-04 ENCOUNTER — Encounter: Payer: Self-pay | Admitting: Medical

## 2017-04-04 VITALS — BP 157/95 | HR 82 | Temp 98.2°F | Resp 16 | Ht 73.0 in | Wt 250.0 lb

## 2017-04-04 DIAGNOSIS — R361 Hematospermia: Secondary | ICD-10-CM

## 2017-04-04 DIAGNOSIS — I10 Essential (primary) hypertension: Secondary | ICD-10-CM

## 2017-04-04 LAB — POCT URINALYSIS DIPSTICK
Bilirubin, UA: NEGATIVE
Blood, UA: NEGATIVE
GLUCOSE UA: NEGATIVE
Ketones, UA: NEGATIVE
LEUKOCYTES UA: NEGATIVE
NITRITE UA: NEGATIVE
PROTEIN UA: NEGATIVE
Urobilinogen, UA: NEGATIVE E.U./dL — AB
pH, UA: 6 (ref 5.0–8.0)

## 2017-04-04 NOTE — Patient Instructions (Addendum)
For your recent blood in ejaculate, I ran a urine test today and that looks clear.  You had recent workup by urologist 2 days ago and I suspect they probably did PSA, possible CBC and may have done a culture of your urine.  Since you had negative STD testing at urgent care initially, I will no  repeat those test.  We tried to call the urologist now but they were closed.  I wanted to review those records.  My nurse/medical assistant is going to try to call tomorrow morning first thing.  If by 10 AM tomorrow morning we do not give you an update on those lab results then please my chart me as a reminder.  If after review of urology notes there is no clear-cut reason for your blood in the ejaculate and if your symptoms persist then I would consider getting a second opinion.  Follow-up date to be determined after reviewing neurology notes.    Regarding your elevated blood pressure, this is without your medications.  You have no cardiac or neurologic signs or symptoms presently.  Please restart your medications so you will not have any complication from very high blood pressure.  If you do have any cardiac or neurologic signs or symptoms then recommend ED evaluation.

## 2017-04-04 NOTE — Progress Notes (Signed)
Subjective:    Patient ID: Dustin [name], male    DOB: 10-07-1965, 52 y.o.   MRN: [id number]  HPI  Pt states he has had some blood in his ejaculate. Pt states since March 18, 2017 he has had some blood in his ejaculate. He states at first he had little blood in ejaculate. Now it looks like it is getting worse.  But he reports no blood in his urine.  Not having any urinary symptoms.  No prostatitis type symptoms either.  Testicles are not painful.  Pt states went to UC first but [place].. At UC they did std test and he states they were clear.   Then he did follow up then he followed up with [facility] 2 days ago.   Pt had prostate check. He had some blood work. Pt was not given any medication. No diagnosis given.  He was told he would get an update when the workup studies were back.   Pt has been on doxycycline after he had rod removed from his rt femur.  Urologist did not add antibiotic.  Pt does not have any pain presently. The urologist did not give exact diagnosis for patient.  Patient indicated that urologist stated that likely the symptoms would resolve.     Review of Systems  Constitutional: Negative for chills, fatigue and fever.  Respiratory: Negative for cough, chest tightness, shortness of breath and wheezing.   Cardiovascular: Negative for chest pain.  Gastrointestinal: Negative for abdominal pain.  Genitourinary: Negative for discharge, dysuria, flank pain, genital sores, penile pain, penile swelling, scrotal swelling, testicular pain and urgency.  Musculoskeletal: Negative for arthralgias, back pain, myalgias and neck stiffness.  Neurological: Negative for dizziness, numbness and headaches.  Hematological: Negative for adenopathy. Does not bruise/bleed easily.  Psychiatric/Behavioral: Negative for behavioral problems and confusion.    Past Medical History:  Diagnosis Date  . Arthritis    "right knee, hips; lower back"  (11/09/2016)  . Chronic  lower back pain   . Chronic osteomyelitis of right femur ([place])   . Chronic pain syndrome   . Dyspnea    with exertion  . Hypertension   . Obesity   . PTSD (post-traumatic stress disorder)      Social History   Socioeconomic History  . Marital status: Single    Spouse name: Not on file  . Number of children: Not on file  . Years of education: Not on file  . Highest education level: Not on file  Social Needs  . Financial resource strain: Not on file  . Food insecurity - worry: Not on file  . Food insecurity - inability: Not on file  . Transportation needs - medical: Not on file  . Transportation needs - non-medical: Not on file  Occupational History  . Not on file  Tobacco Use  . Smoking status: Never Smoker  . Smokeless tobacco: Never Used  Substance and Sexual Activity  . Alcohol use: Yes    Comment: 11/09/2016 "might have 1 shot/year; if that"  . Drug use: No  . Sexual activity: Not Currently  Other Topics Concern  . Not on file  Social History Narrative   ** Merged History Encounter **        Past Surgical History:  Procedure Laterality Date  . ANTERIOR CERVICAL DECOMP/DISCECTOMY FUSION  2007   C 5, 6 ,7  . BACK SURGERY    . CLOSED REDUCTION WRIST FRACTURE Left 04/01/2015   Procedure: CLOSED REDUCTION  WRIST AND CASTING REPAIR LACERATION TO FINGERS TIMES 2;  Surgeon: Dorna Leitz, MD;  Location: [place];  Service: Orthopedics;  Laterality: Left;  . FEMUR IM NAIL Right 04/01/2015   Procedure: INTRAMEDULLARY (IM) RETROGRADE FEMORAL NAILING ;  Surgeon: Dorna Leitz, MD;  Location: [place];  Service: Orthopedics;  Laterality: Right;  . FRACTURE SURGERY    . HARDWARE REMOVAL Right 10/09/2016   Procedure: Dynamization of Nail Right  femur, removal of Screws and Excision of hematoma cavity right thigh;  Surgeon: Jessy Oto, MD;  Location: [place];  Service: Orthopedics;  Laterality: Right;  . HARDWARE REMOVAL Right 01/25/2017   Procedure: REMOVALOF RIGHT FEMUR INTRAMEDULLARY  NAIL AND DISTAL INTERLOCKING SCREW, IRRIGATION AND DEBRIDEMENT RIGHT KNEE;  Surgeon: Jessy Oto, MD;  Location: [place];  Service: Orthopedics;  Laterality: Right;  . HEMATOMA EVACUATION Right 10/09/2016   Procedure: EVACUATION HEMATOMA;  Surgeon: Jessy Oto, MD;  Location: [place];  Service: Orthopedics;  Laterality: Right;  . KNEE ARTHROSCOPY Right 11/09/2016   I&D  . KNEE ARTHROSCOPY Right 11/09/2016   Procedure: ARTHROSCOPIC IRRIGATION AND DEBRIDEMENT RIGHT KNEE;  Surgeon: Jessy Oto, MD;  Location: [place];  Service: Orthopedics;  Laterality: Right;  . KNEE ARTHROSCOPY W/ ACL RECONSTRUCTION Right 2001  . LABRAL REPAIR Right 06/2005  . RESECTION DISTAL CLAVICAL Right 2009    Family History  Problem Relation Age of Onset  . Colon cancer Neg Hx     Allergies  Allergen Reactions  . Latex Rash and Swelling  . Mobic [Meloxicam] Nausea Only    Current Outpatient Medications on File Prior to Visit  Medication Sig Dispense Refill  . aspirin EC 325 MG EC tablet Take 1 tablet (325 mg total) by mouth 2 (two) times daily after a meal. (Patient taking differently: Take 325 mg by mouth daily. ) 60 tablet 0  . aspirin EC 325 MG EC tablet Take 1 tablet (325 mg total) by mouth 2 (two) times daily after a meal. 60 tablet 0  . cyclobenzaprine (FLEXERIL) 10 MG tablet Take 1 tablet (10 mg total) by mouth 3 (three) times daily as needed for muscle spasms. 60 tablet 0  . diclofenac sodium (VOLTAREN) 1 % GEL Apply 4 g topically 4 (four) times daily. 100 g 0  . doxycycline (VIBRAMYCIN) 100 MG capsule Take 1 capsule (100 mg total) by mouth 2 (two) times daily. 90 capsule 1  . lisinopril-hydrochlorothiazide (PRINZIDE,ZESTORETIC) 20-25 MG tablet Take 1 tablet by mouth daily. 30 tablet 2  . oxymorphone (OPANA ER) 10 MG 12 hr tablet Take 1 tablet (10 mg total) by mouth every 8 (eight) hours. 90 tablet 0   No current facility-administered medications on file prior to visit.     BP (!) 162/103   Pulse  82   Temp 98.2 F (36.8 C) (Oral)   Resp 16   Ht 6\' 1"  (1.854 m)   Wt 250 lb (113.4 kg)   SpO2 100%   BMI 32.98 kg/m       Objective:   Physical Exam   General- No acute distress. Pleasant patient. Neck- Full range of motion, no jvd Lungs- Clear, even and unlabored. Heart- regular rate and rhythm. Neurologic- CNII- XII grossly intact.  Genital exam- no discharge from penis. Non tender testicles.  Epididymis on both testicle feels normal.        Assessment & Plan:  For your recent blood in ejaculate, I ran a urine test today and that looks clear.  You  had recent workup by urologist 2 days ago and I suspect they probably did PSA, possible CBC and may have done a culture of your urine.  Since you had negative STD testing at urgent care initially, I will not repeat those test.  We tried to call the urologist now but they were closed.  I wanted to review those records.  My nurse/medical assistant is going to try to call tomorrow morning first thing.  If by 10 AM tomorrow morning we do not give you an update on those lab results then please my chart me as a reminder.  If after review of urology notes there is no clear-cut reason for your blood in the ejaculate and if your symptoms persist then I would consider getting a second opinion.  Follow-up date to be determined after reviewing neurology notes.    Regarding your elevated blood pressure, this is without your medications.  You have no cardiac or neurologic signs or symptoms presently.  Please restart your medications so you will not have any complication from very high blood pressure.  If you do have any cardiac or neurologic signs or symptoms then recommend ED evaluation.  Mackie Pai, PA-C

## 2017-04-07 ENCOUNTER — Telehealth: Payer: Self-pay | Admitting: Medical

## 2017-04-07 DIAGNOSIS — R361 Hematospermia: Secondary | ICD-10-CM

## 2017-04-07 NOTE — Telephone Encounter (Signed)
Refer to urologist for second opinion.

## 2017-04-09 ENCOUNTER — Encounter: Payer: Self-pay | Admitting: Family Medicine

## 2017-04-15 ENCOUNTER — Other Ambulatory Visit: Payer: Self-pay | Admitting: Family Medicine

## 2017-04-15 DIAGNOSIS — I10 Essential (primary) hypertension: Secondary | ICD-10-CM

## 2017-04-15 MED ORDER — LISINOPRIL-HYDROCHLOROTHIAZIDE 20-25 MG PO TABS: 1.0000 | ORAL_TABLET | Freq: Every day | ORAL | 0 refills | Status: AC

## 2017-04-18 ENCOUNTER — Ambulatory Visit (INDEPENDENT_AMBULATORY_CARE_PROVIDER_SITE_OTHER): Payer: Medicare HMO | Admitting: Family

## 2017-04-18 ENCOUNTER — Encounter: Payer: Self-pay | Admitting: Family

## 2017-04-18 VITALS — BP 156/106 | HR 87 | Temp 98.1°F | Wt 249.0 lb

## 2017-04-18 DIAGNOSIS — M00061 Staphylococcal arthritis, right knee: Secondary | ICD-10-CM | POA: Diagnosis not present

## 2017-04-18 NOTE — Assessment & Plan Note (Signed)
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 Previous inflammatory markers were significantly improved. Recheck ESR and CRP today. Continue doxycycline 2 more weeks then stop medication. Continue follow-up with orthopedics as scheduled. Knee appears adequately healed at this time. Follow-up as needed or if symptoms return.

## 2017-04-18 NOTE — Progress Notes (Signed)
 Subjective:    Patient ID: Elroy Granja, male    DOB: 02/24/1965, 51 y.o.   MRN: [id number]  Chief Complaint  Patient presents with  . Follow-up    Septic arthritis of the right knee    HPI:  Illias Watt is a 51 y.o. male who presents today for follow up of septic arthritis of the right knee.  Mr. Gossett continues to take his doxycycline for staphylococcus lugduneniss septic arthritis of the right knee. States that he is a "work in progress." Continues to feel pain with a severity of about 7/10 and has a small spot located on his lateral joint line. Denies fevers, chills, edema, redness, discharge, or night sweats. He last follow up with Dr. [name] last month and indicated good healing.   Allergies  Allergen Reactions  . Latex Rash and Swelling  . Mobic [Meloxicam] Nausea Only      Outpatient Medications Prior to Visit  Medication Sig Dispense Refill  . aspirin EC 325 MG EC tablet Take 1 tablet (325 mg total) by mouth 2 (two) times daily after a meal. (Patient taking differently: Take 325 mg by mouth daily. ) 60 tablet 0  . aspirin EC 325 MG EC tablet Take 1 tablet (325 mg total) by mouth 2 (two) times daily after a meal. 60 tablet 0  . cyclobenzaprine (FLEXERIL) 10 MG tablet Take 1 tablet (10 mg total) by mouth 3 (three) times daily as needed for muscle spasms. 60 tablet 0  . diclofenac sodium (VOLTAREN) 1 % GEL Apply 4 g topically 4 (four) times daily. 100 g 0  . doxycycline (VIBRAMYCIN) 100 MG capsule Take 1 capsule (100 mg total) by mouth 2 (two) times daily. 90 capsule 1  . lisinopril-hydrochlorothiazide (PRINZIDE,ZESTORETIC) 20-25 MG tablet Take 1 tablet by mouth daily. 90 tablet 0  . oxymorphone (OPANA ER) 10 MG 12 hr tablet Take 1 tablet (10 mg total) by mouth every 8 (eight) hours. 90 tablet 0   No facility-administered medications prior to visit.      Past Medical History:  Diagnosis Date  . Arthritis    "right knee, hips; lower back"  (11/09/2016)  . Chronic  lower back pain   . Chronic osteomyelitis of right femur (HCC)   . Chronic pain syndrome   . Dyspnea    with exertion  . Hypertension   . Obesity   . PTSD (post-traumatic stress disorder)      Past Surgical History:  Procedure Laterality Date  . ANTERIOR CERVICAL DECOMP/DISCECTOMY FUSION  2007   C 5, 6 ,7  . BACK SURGERY    . CLOSED REDUCTION WRIST FRACTURE Left 04/01/2015   Procedure: CLOSED REDUCTION WRIST AND CASTING REPAIR LACERATION TO FINGERS TIMES 2;  Surgeon: John Graves, MD;  [facility]: [facility];  Service: Orthopedics;  Laterality: Left;  . FEMUR IM NAIL Right 04/01/2015   Procedure: INTRAMEDULLARY (IM) RETROGRADE FEMORAL NAILING ;  Surgeon: John Graves, MD;  Location: [facility];  Service: Orthopedics;  Laterality: Right;  . FRACTURE SURGERY    . HARDWARE REMOVAL Right 10/09/2016   Procedure: Dynamization of Nail Right  femur, removal of Screws and Excision of hematoma cavity right thigh;  Surgeon: [name], James E, MD;  Location: [facility];  Service: Orthopedics;  Laterality: Right;  . HARDWARE REMOVAL Right 01/25/2017   Procedure: REMOVALOF RIGHT FEMUR INTRAMEDULLARY NAIL AND DISTAL INTERLOCKING SCREW, IRRIGATION AND DEBRIDEMENT RIGHT KNEE;  Surgeon: [name], James E, MD;  [facility];  Service: Orthopedics;  Laterality: Right;  .   HEMATOMA EVACUATION Right 10/09/2016   Procedure: EVACUATION HEMATOMA;  Surgeon: [name], James E, MD;  [facility];  Service: Orthopedics;  Laterality: Right;  . KNEE ARTHROSCOPY Right 11/09/2016   I&D  . KNEE ARTHROSCOPY Right 11/09/2016   Procedure: ARTHROSCOPIC IRRIGATION AND DEBRIDEMENT RIGHT KNEE;  Surgeon: [name], James E, MD;  [facility];  Service: Orthopedics;  Laterality: Right;  . KNEE ARTHROSCOPY W/ ACL RECONSTRUCTION Right 2001  . LABRAL REPAIR Right 06/2005  . RESECTION DISTAL CLAVICAL Right 2009       Review of Systems  Constitutional: Negative for chills, fatigue, fever and unexpected weight change.  Respiratory: Negative for cough,  chest tightness, shortness of breath and wheezing.   Cardiovascular: Negative for chest pain.  Musculoskeletal:       Positive for right knee pain      Objective:    BP (!) 156/106   Pulse 87   Temp 98.1 F (36.7 C) (Oral)   Wt 249 lb (112.9 kg)   BMI 32.85 kg/m  Nursing note and vital signs reviewed.  Physical Exam  Constitutional: He is oriented to person, place, and time. He appears well-developed and well-nourished. No distress.  Seated in the chair; pleasant; Rollator in front of him  Cardiovascular: Normal rate, regular rhythm, normal heart sounds and intact distal pulses.  Pulmonary/Chest: Effort normal and breath sounds normal.  Musculoskeletal:  Right knee - no obvious deformity or discoloration with mild edema. Mild tenderness over lateral joint line with no crepitus or deformity. Range of motion within normal limits. Distal pulses and sensation are intact and appropriate. Surgical scars appear intact with no evidence of infection.  Neurological: He is alert and oriented to person, place, and time.  Skin: Skin is warm and dry.  Psychiatric: He has a normal mood and affect. His behavior is normal. Judgment and thought content normal.       Assessment & Plan:   Problem List Items Addressed This Visit      Musculoskeletal and Integument   Septic arthritis of knee, right (HCC) - Primary    Mr. Spicer continues to take his doxycycline for septic arthritis of the right knee with Staphylococcus Lugdunensis. Previous inflammatory markers were significantly improved. Recheck ESR and CRP today. Continue doxycycline 2 more weeks then stop medication. Continue follow-up with orthopedics as scheduled. Knee appears adequately healed at this time. Follow-up as needed or if symptoms return.      Relevant Orders   C-reactive protein   Sedimentation rate       I am having Crimson Swire maintain his cyclobenzaprine, aspirin, oxymorphone, aspirin, doxycycline, diclofenac sodium,  and lisinopril-hydrochlorothiazide.   Follow-up: Return if symptoms worsen or fail to improve.   Greg Calone, MSN, FNP-C [facility]    

## 2017-04-18 NOTE — Patient Instructions (Signed)
Good to see you!  Your knee is look very good.  Stop taking the Doxycycline on 05/02/17.  Continue to follow up with Dr. Louanne Skye  No additional follow up necessary unless you are worsening.

## 2017-04-19 ENCOUNTER — Encounter: Payer: Self-pay | Admitting: Family

## 2017-04-19 ENCOUNTER — Ambulatory Visit (INDEPENDENT_AMBULATORY_CARE_PROVIDER_SITE_OTHER): Payer: Medicare HMO | Admitting: Specialist

## 2017-04-19 LAB — SEDIMENTATION RATE: SED RATE: 2 mm/h (ref 0–20)

## 2017-04-19 LAB — C-REACTIVE PROTEIN: CRP: 0.7 mg/L (ref <8.0)

## 2017-04-21 ENCOUNTER — Telehealth: Payer: Self-pay | Admitting: Medical

## 2017-04-21 NOTE — Telephone Encounter (Signed)
Did patient get in with new urologist at [facility]? He was waiting on referral update.

## 2017-04-22 NOTE — Telephone Encounter (Signed)
Referral rerouted to [facility]- urology in [place]: [phone] Fax: [phone] Address: [address] [place], [place]. Referral created over phone with Devin. Appt: April 8th, @ 11am. With [employer]. Insurance Accepted. Patient aware.

## 2017-05-08 ENCOUNTER — Telehealth: Payer: Self-pay | Admitting: Family Medicine

## 2017-05-08 ENCOUNTER — Other Ambulatory Visit: Payer: Self-pay | Admitting: Family Medicine

## 2017-05-08 DIAGNOSIS — G8929 Other chronic pain: Secondary | ICD-10-CM

## 2017-05-08 NOTE — Telephone Encounter (Signed)
Copied from [place] #[id number]. Topic: Referral - Request >> May 08, 2017  9:42 AM Scherrie Gerlach wrote: Reason for CRM: pt would like the dr to refer him to Neurology pain medicine [address] [place], [place] [address] Phone:  [phone]  Fax: [phone]    Dr  Nathanial Rancher

## 2017-05-08 NOTE — Telephone Encounter (Signed)
OK TY

## 2017-05-08 NOTE — Telephone Encounter (Signed)
Referral done

## 2017-05-23 ENCOUNTER — Encounter (INDEPENDENT_AMBULATORY_CARE_PROVIDER_SITE_OTHER): Payer: Self-pay | Admitting: Specialist

## 2017-05-23 ENCOUNTER — Ambulatory Visit (INDEPENDENT_AMBULATORY_CARE_PROVIDER_SITE_OTHER): Payer: Medicare HMO | Admitting: Specialist

## 2017-05-23 VITALS — BP 177/106 | HR 77 | Ht 73.5 in | Wt 252.0 lb

## 2017-05-23 DIAGNOSIS — M24561 Contracture, right knee: Secondary | ICD-10-CM

## 2017-05-23 DIAGNOSIS — S7011XS Contusion of right thigh, sequela: Secondary | ICD-10-CM

## 2017-05-23 DIAGNOSIS — R29898 Other symptoms and signs involving the musculoskeletal system: Secondary | ICD-10-CM

## 2017-05-23 NOTE — Progress Notes (Signed)
Office Visit Note   Patient: Dustin [name]           Date of Birth: 10/07/1965           MRN: [id number] Visit Date: 05/23/2017              Requested by: Shelda Pal, [address] [place], [place] [address] PCP: Shelda Pal, DO   Assessment & Plan: Visit Diagnoses:  1. Weakness of right leg   2. Traumatic hematoma of right thigh, sequela   3. Knee contracture, right     Plan: Knee is suffering from osteoarthritis, only real proven treatments are Weight loss, NSIADs like diclofenac and exercise. Well padded shoes help. Ice the knee 2-3 times a day 15-20 mins at a time. Therapy to wean from walker and improve strength in the knee extension.  Follow-Up Instructions: Return if symptoms worsen or fail to improve.   Orders:  Orders Placed This Encounter  Procedures  . Ambulatory referral to Physical Therapy   No orders of the defined types were placed in this encounter.     Procedures: No procedures performed   Clinical Data: No additional findings.   Subjective: Chief Complaint  Patient presents with  . 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 Underwent a removal of the proximal interlocking screws and debridement of the vastus intermedius and went on to develop an infection in the right knee joint and likely along the IM Nail. He returned to the OR and had initially an arthroscopic  Irrigation of the right knee with return of infection the nail was removed and with IV antibiotics he has healed  The right thigh and leg and knee. He is off antibiotics.    Review of Systems  Constitutional: Positive for activity change, appetite change and unexpected weight change. Negative for chills, diaphoresis, fatigue and fever.  HENT: Negative.   Eyes: Positive for visual disturbance. Negative for photophobia, pain, discharge, redness and  itching.  Respiratory: Negative.   Cardiovascular: Negative.   Gastrointestinal: Negative.  Negative for abdominal distention, abdominal pain, anal bleeding, blood in stool, constipation, diarrhea and nausea.  Endocrine: Negative for cold intolerance and heat intolerance.  Genitourinary: Negative.  Negative for difficulty urinating, dysuria, enuresis, flank pain, frequency, genital sores and hematuria.  Musculoskeletal: Positive for back pain, gait problem and joint swelling. Negative for arthralgias, myalgias, neck pain and neck stiffness.  Skin: Negative for color change, pallor, rash and wound.  Allergic/Immunologic: Negative for environmental allergies, food allergies and immunocompromised state.  Neurological: Positive for weakness and numbness. Negative for dizziness, tremors, seizures, syncope, facial asymmetry, speech difficulty, light-headedness and headaches.  Hematological: Negative for adenopathy. Does not bruise/bleed easily.  Psychiatric/Behavioral: Negative for agitation, behavioral problems, confusion, decreased concentration, dysphoric mood, hallucinations, self-injury, sleep disturbance and suicidal ideas. The patient is not nervous/anxious and is not hyperactive.      Objective: Vital Signs: BP (!) 177/106 (BP Location: Left Arm, Patient Position: Sitting)   Pulse 77   Ht 6' 1.5" (1.867 m)   Wt 252 lb (114.3 kg)   BMI 32.80 kg/m   Physical Exam  Constitutional: He is oriented to person, place, and time. He appears well-developed and well-nourished.  HENT:  Head: Normocephalic and atraumatic.  Eyes: Pupils are equal, round, and reactive to light. EOM are normal.  Neck: Normal range of motion. Neck supple.  Pulmonary/Chest: Effort normal and breath sounds normal.  Abdominal: Soft. Bowel sounds are normal.  Musculoskeletal: Normal range of motion.       Right knee: He exhibits no effusion.  Neurological: He is alert and oriented to person, place, and time.  Skin:  Skin is warm and dry.  Psychiatric: He has a normal mood and affect. His behavior is normal. Judgment and thought content normal.    Right Knee Exam  Right knee exam is normal.  Tenderness  The patient is experiencing tenderness in the medial joint line.  Range of Motion  Extension: normal  Flexion: 130   Tests  McMurray:  Medial - negative Lateral - negative Varus: negative Valgus: negative Lachman:  Anterior - negative    Posterior - negative Drawer:  Anterior - negative    Posterior - negative Pivot shift: negative Patellar apprehension: negative  Other  Erythema: absent Scars: absent Sensation: normal Pulse: present Swelling: mild Effusion: no effusion present      Specialty Comments:  No specialty comments available.  Imaging: No results found.   PMFS History: Patient Active Problem List   Diagnosis Date Noted  . Chronic osteomyelitis of right femur ([place]) 01/29/2017    Priority: High    Class: Chronic  . Chronic pain syndrome 11/27/2016    Priority: High    Class: Chronic  . Traumatic seroma of thigh, right, sequela 09/11/2016    Priority: Low    Class: Chronic  . Retained orthopedic hardware 09/11/2016    Priority: Low    Class: Chronic  . S/P hardware removal 01/25/2017  . Effusion of right knee joint 01/09/2017  . Septic arthritis of knee, right ([place]) 11/09/2016    Class: Acute  . Effusion of right knee 11/08/2016  . Thigh hematoma, right, sequela 01/23/2016  . Essential hypertension 12/15/2015  . Distal radius fracture, left 04/02/2015  . Closed fracture of right femur ([place]) 04/01/2015   Past Medical History:  Diagnosis Date  . Arthritis    "right knee, hips; lower back"  (11/09/2016)  . Chronic lower back pain   . Chronic osteomyelitis of right femur ([place])   . Chronic pain syndrome   . Dyspnea    with exertion  . Hypertension   . Obesity   . PTSD (post-traumatic stress disorder)     Family History  Problem Relation Age of Onset   . Colon cancer Neg Hx     Past Surgical History:  Procedure Laterality Date  . ANTERIOR CERVICAL DECOMP/DISCECTOMY FUSION  2007   C 5, 6 ,7  . BACK SURGERY    . CLOSED REDUCTION WRIST FRACTURE Left 04/01/2015   Procedure: CLOSED REDUCTION WRIST AND CASTING REPAIR LACERATION TO FINGERS TIMES 2;  Surgeon: Dorna Leitz, MD;  Location: [facility];  Service: Orthopedics;  Laterality: Left;  . FEMUR IM NAIL Right 04/01/2015   Procedure: INTRAMEDULLARY (IM) RETROGRADE FEMORAL NAILING ;  Surgeon: Dorna Leitz, MD;  Location: [facility];  Service: Orthopedics;  Laterality: Right;  . FRACTURE SURGERY    . HARDWARE REMOVAL Right 10/09/2016   Procedure: Dynamization of Nail Right  femur, removal of Screws and Excision of hematoma cavity right thigh;  Surgeon: Jessy Oto, MD;  Location: [facility];  Service: Orthopedics;  Laterality: Right;  . HARDWARE REMOVAL Right 01/25/2017   Procedure: REMOVALOF RIGHT FEMUR INTRAMEDULLARY NAIL AND DISTAL INTERLOCKING SCREW, IRRIGATION AND DEBRIDEMENT RIGHT KNEE;  Surgeon: Jessy Oto, MD;  Location: [facility];  Service: Orthopedics;  Laterality: Right;  .  HEMATOMA EVACUATION Right 10/09/2016   Procedure: EVACUATION HEMATOMA;  Surgeon: Jessy Oto, MD;  Location: [facility];  Service: Orthopedics;  Laterality: Right;  . KNEE ARTHROSCOPY Right 11/09/2016   I&D  . KNEE ARTHROSCOPY Right 11/09/2016   Procedure: ARTHROSCOPIC IRRIGATION AND DEBRIDEMENT RIGHT KNEE;  Surgeon: Jessy Oto, MD;  Location: [facility];  Service: Orthopedics;  Laterality: Right;  . KNEE ARTHROSCOPY W/ ACL RECONSTRUCTION Right 2001  . LABRAL REPAIR Right 06/2005  . RESECTION DISTAL CLAVICAL Right 2009   Social History   Occupational History  . Not on file  Tobacco Use  . Smoking status: Never Smoker  . Smokeless tobacco: Never Used  Substance and Sexual Activity  . Alcohol use: Yes    Comment: 11/09/2016 "might have 1 shot/year; if that"  . Drug use: No  . Sexual activity: Not Currently

## 2017-05-23 NOTE — Patient Instructions (Signed)
  Knee is suffering from osteoarthritis, only real proven treatments are Weight loss, NSIADs like diclofenac and exercise. Well padded shoes help. Ice the knee 2-3 times a day 15-20 mins at a time. Therapy to wean from walker and improve strength in the knee extension.

## 2017-05-29 ENCOUNTER — Other Ambulatory Visit: Payer: Self-pay

## 2017-05-29 ENCOUNTER — Encounter (HOSPITAL_BASED_OUTPATIENT_CLINIC_OR_DEPARTMENT_OTHER): Payer: Self-pay | Admitting: Emergency Medicine

## 2017-05-29 ENCOUNTER — Emergency Department (HOSPITAL_BASED_OUTPATIENT_CLINIC_OR_DEPARTMENT_OTHER)
Admission: EM | Admit: 2017-05-29 | Discharge: 2017-05-29 | Disposition: A | Discharge status: Home / Self Care | Payer: Medicare HMO | Attending: Emergency Medicine | Admitting: Emergency Medicine

## 2017-05-29 DIAGNOSIS — Z7982 Long term (current) use of aspirin: Secondary | ICD-10-CM | POA: Insufficient documentation

## 2017-05-29 DIAGNOSIS — R05 Cough: Secondary | ICD-10-CM | POA: Diagnosis not present

## 2017-05-29 DIAGNOSIS — Z79899 Other long term (current) drug therapy: Secondary | ICD-10-CM | POA: Insufficient documentation

## 2017-05-29 DIAGNOSIS — R059 Cough, unspecified: Secondary | ICD-10-CM

## 2017-05-29 MED ORDER — BENZONATATE 100 MG PO CAPS
100.0000 mg | ORAL_CAPSULE | Freq: Once | ORAL | Status: AC
  Administered 2017-05-29: 100 mg via ORAL
  Filled 2017-05-29: qty 1

## 2017-05-29 MED ORDER — BENZONATATE 100 MG PO CAPS: 100.0000 mg | ORAL_CAPSULE | Freq: Three times a day (TID) | ORAL | 0 refills | Status: AC | PRN

## 2017-05-29 MED FILL — BENZONATATE 100 MG CAPSULE: 100 | 7 days supply | Qty: 21 | Fill #0

## 2017-05-29 NOTE — ED Provider Notes (Signed)
[facility] EMERGENCY DEPARTMENT Provider Note   CSN: [id number] Arrival date & time: 05/29/17  0347     History   Chief Complaint Chief Complaint  Patient presents with  . Cough    HPI Dustin [name] is a 52 y.o. male.   Cough  This is a new problem. The current episode started more than 2 days ago. The problem occurs constantly. The problem has not changed since onset.The cough is productive of sputum. There has been no fever. Associated symptoms include chest pain (burning while coughing) and rhinorrhea. Pertinent negatives include no sweats, no ear congestion, no shortness of breath and no eye redness. He has tried decongestants for the symptoms. The treatment provided no relief. He is not a smoker. His past medical history does not include pneumonia, bronchiectasis or COPD.    Past Medical History:  Diagnosis Date  . Arthritis    "right knee, hips; lower back"  (11/09/2016)  . Chronic lower back pain   . Chronic osteomyelitis of right femur ([place])   . Chronic pain syndrome   . Dyspnea    with exertion  . Hypertension   . Obesity   . PTSD (post-traumatic stress disorder)     Patient Active Problem List   Diagnosis Date Noted  . Chronic osteomyelitis of right femur ([place]) 01/29/2017    Class: Chronic  . S/P hardware removal 01/25/2017  . Effusion of right knee joint 01/09/2017  . Chronic pain syndrome 11/27/2016    Class: Chronic  . Septic arthritis of knee, right ([place]) 11/09/2016    Class: Acute  . Effusion of right knee 11/08/2016  . 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 Retained orthopedic hardware 09/11/2016    Class: Chronic  . Thigh hematoma, right, sequela 01/23/2016  . Essential hypertension 12/15/2015  . Distal radius fracture, left 04/02/2015  . Closed fracture of right femur ([facility]) 04/01/2015    Past Surgical History:  Procedure Laterality Date  . ANTERIOR CERVICAL DECOMP/DISCECTOMY FUSION  2007   C  5, 6 ,7  . BACK SURGERY    . CLOSED REDUCTION WRIST FRACTURE Left 04/01/2015   Procedure: CLOSED REDUCTION WRIST AND CASTING REPAIR LACERATION TO FINGERS TIMES 2;  Surgeon: Dorna Leitz, MD;  Location: [facility];  Service: Orthopedics;  Laterality: Left;  . FEMUR IM NAIL Right 04/01/2015   Procedure: INTRAMEDULLARY (IM) RETROGRADE FEMORAL NAILING ;  Surgeon: Dorna Leitz, MD;  Location: [facility];  Service: Orthopedics;  Laterality: Right;  . FRACTURE SURGERY    . HARDWARE REMOVAL Right 10/09/2016   Procedure: Dynamization of Nail Right  femur, removal of Screws and Excision of hematoma cavity right thigh;  Surgeon: Jessy Oto, MD;  Location: [facility];  Service: Orthopedics;  Laterality: Right;  . HARDWARE REMOVAL Right 01/25/2017   Procedure: REMOVALOF RIGHT FEMUR INTRAMEDULLARY NAIL AND DISTAL INTERLOCKING SCREW, IRRIGATION AND DEBRIDEMENT RIGHT KNEE;  Surgeon: Jessy Oto, MD;  Location: [facility];  Service: Orthopedics;  Laterality: Right;  . HEMATOMA EVACUATION Right 10/09/2016   Procedure: EVACUATION HEMATOMA;  Surgeon: Jessy Oto, MD;  Location: [facility];  Service: Orthopedics;  Laterality: Right;  . KNEE ARTHROSCOPY Right 11/09/2016   I&D  . KNEE ARTHROSCOPY Right 11/09/2016   Procedure: ARTHROSCOPIC IRRIGATION AND DEBRIDEMENT RIGHT KNEE;  Surgeon: Jessy Oto, MD;  Location: [facility];  Service: Orthopedics;  Laterality: Right;  . KNEE ARTHROSCOPY W/ ACL RECONSTRUCTION Right 2001  . LABRAL REPAIR Right 06/2005  .  RESECTION DISTAL CLAVICAL Right 2009        Home Medications    Prior to Admission medications   Medication Sig Start Date End Date Taking? Authorizing Provider  aspirin EC 325 MG EC tablet Take 1 tablet (325 mg total) by mouth 2 (two) times daily after a meal. Patient taking differently: Take 325 mg by mouth daily.  11/16/16   Jessy Oto, MD  aspirin EC 325 MG EC tablet Take 1 tablet (325 mg total) by mouth 2 (two) times daily after a meal. 01/30/17   Jessy Oto, MD    benzonatate (TESSALON) 100 MG capsule Take 1 capsule (100 mg total) by mouth 3 (three) times daily as needed for cough. 05/29/17   Nabeeha Badertscher, Corene Cornea, MD  cyclobenzaprine (FLEXERIL) 10 MG tablet Take 1 tablet (10 mg total) by mouth 3 (three) times daily as needed for muscle spasms. 10/10/16   Jessy Oto, MD  diclofenac sodium (VOLTAREN) 1 % GEL Apply 4 g topically 4 (four) times daily. 03/28/17   Palumbo, April, MD  doxycycline (VIBRAMYCIN) 100 MG capsule Take 1 capsule (100 mg total) by mouth 2 (two) times daily. 01/30/17   Jessy Oto, MD  lisinopril-hydrochlorothiazide (PRINZIDE,ZESTORETIC) 20-25 MG tablet Take 1 tablet by mouth daily. 04/15/17   Shelda Pal, DO  oxymorphone (OPANA ER) 10 MG 12 hr tablet Take 1 tablet (10 mg total) by mouth every 8 (eight) hours. 11/27/16   Jessy Oto, MD    Family History Family History  Problem Relation Age of Onset  . Colon cancer Neg Hx     Social History Social History   Tobacco Use  . Smoking status: Never Smoker  . Smokeless tobacco: Never Used  Substance Use Topics  . Alcohol use: Yes    Comment: 11/09/2016 "might have 1 shot/year; if that"  . Drug use: No     Allergies   Latex and Mobic [meloxicam]   Review of Systems Review of Systems  HENT: Positive for rhinorrhea.   Eyes: Negative for redness.  Respiratory: Positive for cough. Negative for shortness of breath.   Cardiovascular: Positive for chest pain (burning while coughing).  All other systems reviewed and are negative.    Physical Exam Updated Vital Signs BP (!) 177/110 (BP Location: Left Arm)   Pulse 80   Temp 98.2 F (36.8 C) (Oral)   Resp 16   Ht 6' 1.5" (1.867 m)   Wt 114.3 kg (252 lb)   SpO2 100%   BMI 32.80 kg/m   Physical Exam  Constitutional: He is oriented to person, place, and time. He appears well-developed and well-nourished.  HENT:  Head: Normocephalic and atraumatic.  Eyes: Conjunctivae and EOM are normal.  Neck: Normal range of  motion.  Cardiovascular: Normal rate.  Pulmonary/Chest: Effort normal. No respiratory distress.  Abdominal: Soft. He exhibits no distension.  Musculoskeletal: Normal range of motion. He exhibits no edema or deformity.  Neurological: He is alert and oriented to person, place, and time. No cranial nerve deficit. Coordination normal.  Skin: Skin is warm and dry.  Nursing note and vitals reviewed.    ED Treatments / Results  Labs (all labs ordered are listed, but only abnormal results are displayed) Labs Reviewed - No data to display  EKG None  Radiology No results found.  Procedures Procedures (including critical care time)  Medications Ordered in ED Medications  benzonatate (TESSALON) capsule 100 mg (has no administration in time range)     Initial Impression /  Assessment and Plan / ED Course  I have reviewed the triage vital signs and the nursing notes.  Pertinent labs & imaging results that were available during my care of the patient were reviewed by me and considered in my medical decision making (see chart for details).     Cough, likely URI. No e/o pneumonia, bronchitis, chf or other acute causes. No respiratory distress. No indication for imaging or further workup at this time. Symptomatic care at home.   Final Clinical Impressions(s) / ED Diagnoses   Final diagnoses:  Cough    ED Discharge Orders        Ordered    benzonatate (TESSALON) 100 MG capsule  3 times daily PRN     05/29/17 0726       Ayush Boulet, Corene Cornea, MD 05/29/17 [phone]

## 2017-05-29 NOTE — ED Triage Notes (Signed)
Cough and congestion x 1 week 

## 2017-06-03 ENCOUNTER — Ambulatory Visit: Payer: Medicare HMO | Attending: Specialist | Admitting: Physical Therapy

## 2017-06-27 ENCOUNTER — Encounter (INDEPENDENT_AMBULATORY_CARE_PROVIDER_SITE_OTHER): Payer: Self-pay | Admitting: Specialist

## 2017-06-27 ENCOUNTER — Ambulatory Visit (INDEPENDENT_AMBULATORY_CARE_PROVIDER_SITE_OTHER): Payer: Medicare HMO

## 2017-06-27 ENCOUNTER — Ambulatory Visit (INDEPENDENT_AMBULATORY_CARE_PROVIDER_SITE_OTHER): Payer: Medicare HMO | Admitting: Specialist

## 2017-06-27 VITALS — BP 178/112 | HR 81 | Ht 73.5 in | Wt 252.0 lb

## 2017-06-27 DIAGNOSIS — M1711 Unilateral primary osteoarthritis, right knee: Secondary | ICD-10-CM | POA: Diagnosis not present

## 2017-06-27 DIAGNOSIS — M25561 Pain in right knee: Secondary | ICD-10-CM

## 2017-06-27 DIAGNOSIS — G8929 Other chronic pain: Secondary | ICD-10-CM | POA: Diagnosis not present

## 2017-06-27 DIAGNOSIS — R29898 Other symptoms and signs involving the musculoskeletal system: Secondary | ICD-10-CM

## 2017-06-27 MED ORDER — DICLOFENAC SODIUM 1 % TD GEL: 4.0000 g | Freq: Four times a day (QID) | TRANSDERMAL | 2 refills | Status: AC

## 2017-06-27 MED ORDER — DICLOFENAC SODIUM 75 MG PO TBEC: 75.0000 mg | DELAYED_RELEASE_TABLET | Freq: Two times a day (BID) | ORAL | 2 refills | Status: AC

## 2017-06-27 NOTE — Progress Notes (Addendum)
Office Visit Note   Patient: Dustin [name]           Date of Birth: Sep 15, 1965           MRN: [id number] Visit Date: 06/27/2017              Requested by: Shelda Pal, [address], [place] [address] PCP: Shelda Pal, DO   Assessment & Plan: Visit Diagnoses:  1. Weakness of right leg   2. Chronic pain of right knee   3. Unilateral primary osteoarthritis, right knee     Plan:  Knee is suffering from osteoarthritis, only real proven treatments are Weight loss, NSIADs like diclofenac and exercise. Well padded shoes help. Ice the knee 2-3 times a day 15-20 mins at a time. Transdermal voltaren gel in addition to oral diclofenac 3-4 times a day. Go to PT at out patient PT on [address].  Follow-Up Instructions: Return in about 1 month (around 07/25/2017).   Orders:  Orders Placed This Encounter  Procedures  . XR Knee 1-2 Views Right  . Ambulatory referral to Physical Therapy   Meds ordered this encounter  Medications  . diclofenac sodium (VOLTAREN) 1 % GEL    Sig: Apply 4 g topically 4 (four) times daily.    Dispense:  5 Tube    Refill:  2  . diclofenac (VOLTAREN) 75 MG EC tablet    Sig: Take 1 tablet (75 mg total) by mouth 2 (two) times daily.    Dispense:  60 tablet    Refill:  2      Procedures: No procedures performed   Clinical Data: No additional findings.   Subjective: Chief Complaint  Patient presents with  . Right Leg - Follow-up    52 year old male with  History of right femur fx 03/2015 He had a right thigh hematoma and muscle compartment seroma/ hematoma and likely compartment syndrome. He underwent a dynamization of the right femur IM Nail and resection of the right thigh vastus intermedius compartment. Post op he developed a right knee pyarthrosis and  Underwent right knee arthroscopic lavage with recurrence a removal of the right IM femoral nail and I&D of the knee with post op antibiotics. He has  healed the infection and now complains of right knee weakness with tendency to have the right knee give away. Has some persistent numbness over the right anterior lateral thigh.    Review of Systems   Objective: Vital Signs: BP (!) 178/112   Pulse 81   Ht 6' 1.5" (1.867 m)   Wt 252 lb (114.3 kg)   BMI 32.80 kg/m   Physical Exam  Ortho Exam  Specialty Comments:  No specialty comments available.  Imaging: Xr Knee 1-2 Views Right  Result Date: 06/27/2017 AP and lateral and tangential patellofemoral, narrowing of the medial right knee joint line, the scar of the proximal medial tibia and central distal femur due to previous IM Nail and ACL reconstruction. Subchondral sclerosis right medial tibial plateau. Lateral facet line of the right P-F joint.     PMFS History: Patient Active Problem List   Diagnosis Date Noted  . Chronic osteomyelitis of right femur ([facility]) 01/29/2017    Priority: High    Class: Chronic  . Chronic pain syndrome 11/27/2016    Priority: High    Class: Chronic  . Traumatic seroma of thigh, right, sequela 09/11/2016    Priority: Low    Class: Chronic  .  Retained orthopedic hardware 09/11/2016    Priority: Low    Class: Chronic  . S/P hardware removal 01/25/2017  . Effusion of right knee joint 01/09/2017  . Septic arthritis of knee, right ([employer]) 11/09/2016    Class: Acute  . Effusion of right knee 11/08/2016  . Thigh hematoma, right, sequela 01/23/2016  . Essential hypertension 12/15/2015  . Distal radius fracture, left 04/02/2015  . Closed fracture of right femur ([facility]) 04/01/2015   Past Medical History:  Diagnosis Date  . Arthritis    "right knee, hips; lower back"  (11/09/2016)  . Chronic lower back pain   . Chronic osteomyelitis of right femur ([facility])   . Chronic pain syndrome   . Dyspnea    with exertion  . Hypertension   . Obesity   . PTSD (post-traumatic stress disorder)     Family History  Problem Relation Age of Onset  . Colon cancer  Neg Hx     Past Surgical History:  Procedure Laterality Date  . ANTERIOR CERVICAL DECOMP/DISCECTOMY FUSION  2007   C 5, 6 ,7  . BACK SURGERY    . CLOSED REDUCTION WRIST FRACTURE Left 04/01/2015   Procedure: CLOSED REDUCTION WRIST AND CASTING REPAIR LACERATION TO FINGERS TIMES 2;  Surgeon: Dorna Leitz, MD;  Location: [facility];  Service: Orthopedics;  Laterality: Left;  . FEMUR IM NAIL Right 04/01/2015   Procedure: INTRAMEDULLARY (IM) RETROGRADE FEMORAL NAILING ;  Surgeon: Dorna Leitz, MD;  Location: [facility];  Service: Orthopedics;  Laterality: Right;  . FRACTURE SURGERY    . HARDWARE REMOVAL Right 10/09/2016   Procedure: Dynamization of Nail Right  femur, removal of Screws and Excision of hematoma cavity right thigh;  Surgeon: Jessy Oto, MD;  Location: [facility];  Service: Orthopedics;  Laterality: Right;  . HARDWARE REMOVAL Right 01/25/2017   Procedure: REMOVALOF RIGHT FEMUR INTRAMEDULLARY NAIL AND DISTAL INTERLOCKING SCREW, IRRIGATION AND DEBRIDEMENT RIGHT KNEE;  Surgeon: Jessy Oto, MD;  Location: [facility];  Service: Orthopedics;  Laterality: Right;  . HEMATOMA EVACUATION Right 10/09/2016   Procedure: EVACUATION HEMATOMA;  Surgeon: Jessy Oto, MD;  Location: [facility];  Service: Orthopedics;  Laterality: Right;  . KNEE ARTHROSCOPY Right 11/09/2016   I&D  . KNEE ARTHROSCOPY Right 11/09/2016   Procedure: ARTHROSCOPIC IRRIGATION AND DEBRIDEMENT RIGHT KNEE;  Surgeon: Jessy Oto, MD;  Location: [facility];  Service: Orthopedics;  Laterality: Right;  . KNEE ARTHROSCOPY W/ ACL RECONSTRUCTION Right 2001  . LABRAL REPAIR Right 06/2005  . RESECTION DISTAL CLAVICAL Right 2009   Social History   Occupational History  . Not on file  Tobacco Use  . Smoking status: Never Smoker  . Smokeless tobacco: Never Used  Substance and Sexual Activity  . Alcohol use: Yes    Comment: 11/09/2016 "might have 1 shot/year; if that"  . Drug use: No  . Sexual activity: Not Currently

## 2017-06-27 NOTE — Patient Instructions (Addendum)
  Knee is suffering from osteoarthritis, only real proven treatments are Weight loss, NSIADs like diclofenac and exercise. Well padded shoes help. Ice the knee 2-3 times a day 15-20 mins at a time. Transdermal voltaren gel in addition to oral diclofenac 3-4 times a day. Go to PT at out patient PT on [address].

## 2017-07-18 ENCOUNTER — Ambulatory Visit: Payer: Medicare HMO | Attending: Specialist | Admitting: Physical Therapy

## 2017-07-28 IMAGING — RF DG WRIST 2V*L*
1 series · 2 of 2 positions shown · non-contrast
Comparison: None.

CLINICAL DATA: Status post reduction of left wrist fracture in the
OR. Initial encounter.

EXAM:
LEFT WRIST - 2 VIEW; DG C-ARM 61-120 MIN

[Series 1: run · 2 of 2 slices shown]
[im 1/2]
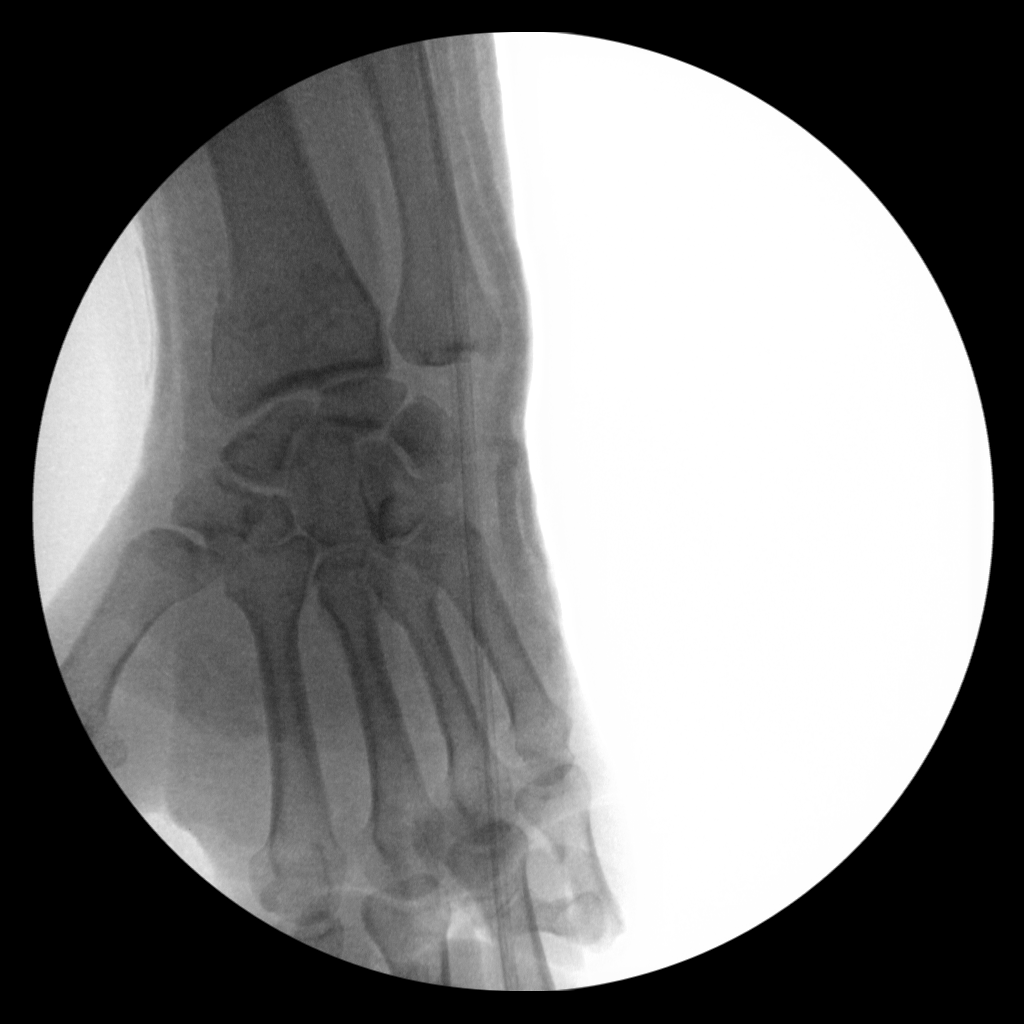
[im 2/2]
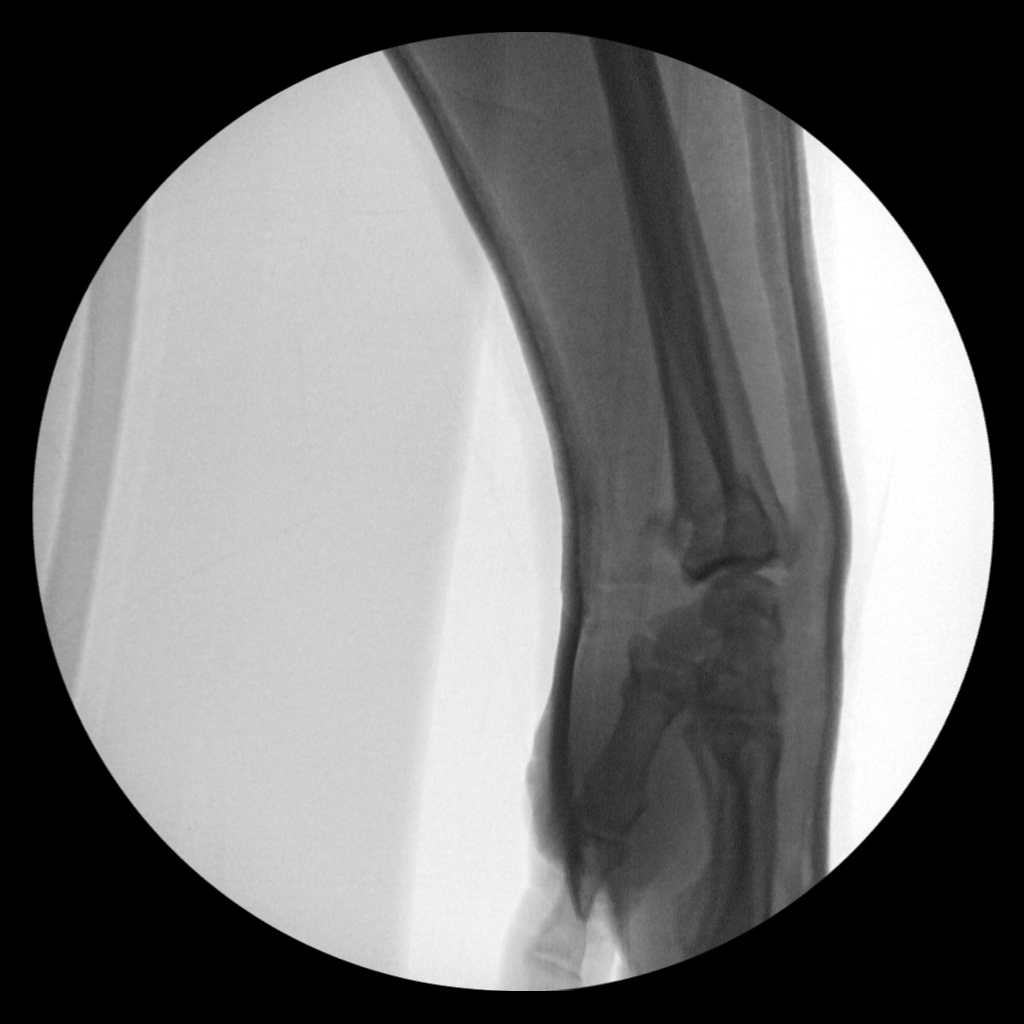

[2 of 2 positions shown; findings below may reference images not displayed]

FINDINGS: Two fluoroscopic C-arm images of the left wrist were performed.
There is mild persistent dorsal displacement of the distal radial
metaphysis, though previously noted angulation has resolved. There
is minimal residual radial displacement. No new fractures are seen.

The carpal rows appear grossly intact, and demonstrate normal
alignment.
IMPRESSION: Improved alignment of left wrist fracture, with mild persistent
dorsal displacement of the distal radial metaphysis, and minimal
residual radial displacement, though previously noted angulation has
resolved.

## 2017-07-31 ENCOUNTER — Other Ambulatory Visit (INDEPENDENT_AMBULATORY_CARE_PROVIDER_SITE_OTHER): Payer: Self-pay | Admitting: Specialist

## 2017-08-01 ENCOUNTER — Ambulatory Visit (INDEPENDENT_AMBULATORY_CARE_PROVIDER_SITE_OTHER): Payer: Medicare HMO | Admitting: Specialist

## 2017-09-04 ENCOUNTER — Telehealth: Payer: Self-pay | Admitting: *Deleted

## 2017-09-04 NOTE — Telephone Encounter (Signed)
Received request for Medical Records from [facility] [employer]; forwarded to [place] for email/scan/SLS 07/17

## 2017-10-23 IMAGING — CT CT ANGIO AOBIFEM WO/W CM
2 of 6 series · 14 of 46 positions shown, 16 images · IV contrast (isovue)
Comparison: Right femur radiograph dated 04/12/2015

CLINICAL DATA: 49-year-old male with right anterior upper thigh
swelling/mass. Recent the surgical repair of a right femoral
fracture with intra medullary rod.

EXAM:
CT ANGIOGRAPHY AOBIFEM WITHOUT AND WITH CONTRAST
TECHNIQUE: Continues 3 mm axial images of the pelvis and proximal aspect of the
lower extremities were performed from the iliac crest to the knee.
Coronal and sagittal reformatted images were provided. Stop
CONTRAST:  100 cc Isovue 370

[Series 6: runoff axial arterial · axial · arterial · 0.91mm/px · z∈[-732,-54]mm · 11 of 262 slices shown, 13 images]
[im 18/262  soft-tissue]
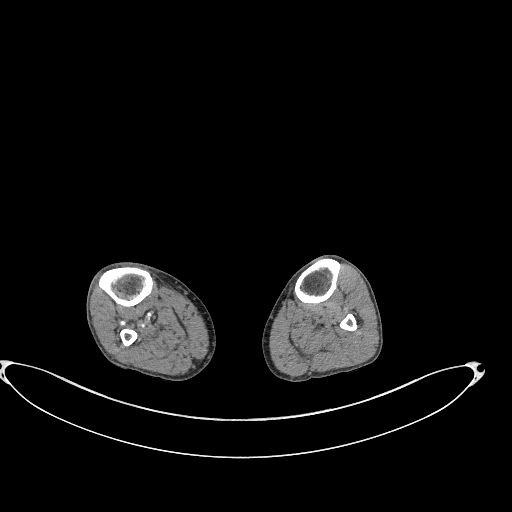
[im 18/262  bone]
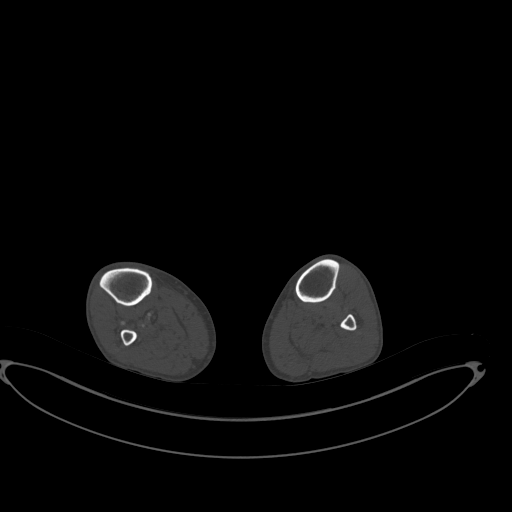
[im 35/262  soft-tissue]
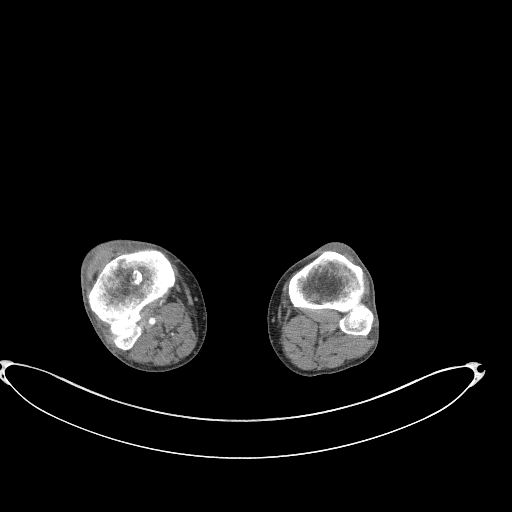
[im 61/262  soft-tissue]
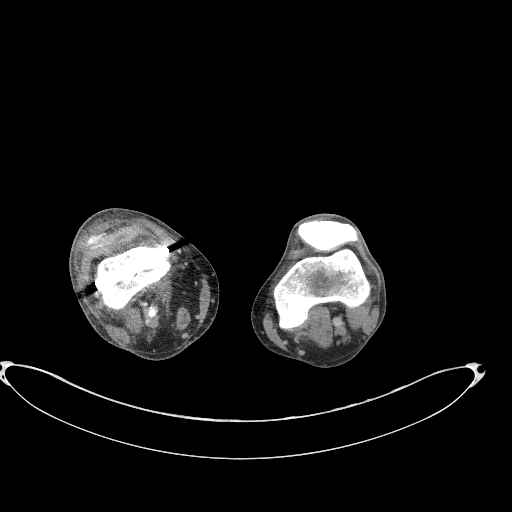
[im 88/262  soft-tissue]
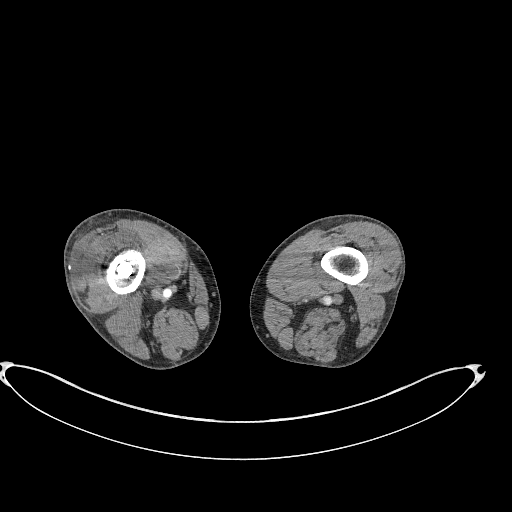
[im 105/262  soft-tissue]
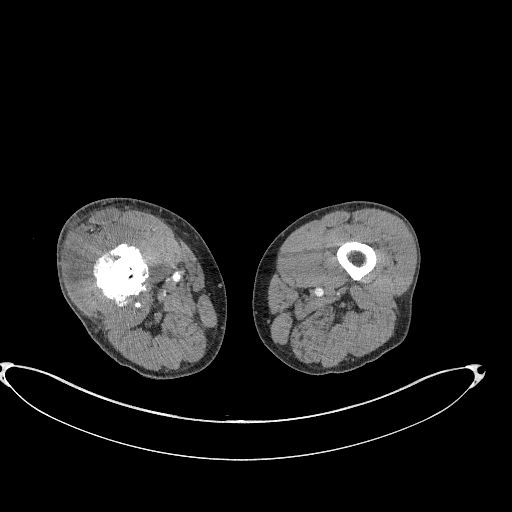
[im 131/262  soft-tissue]
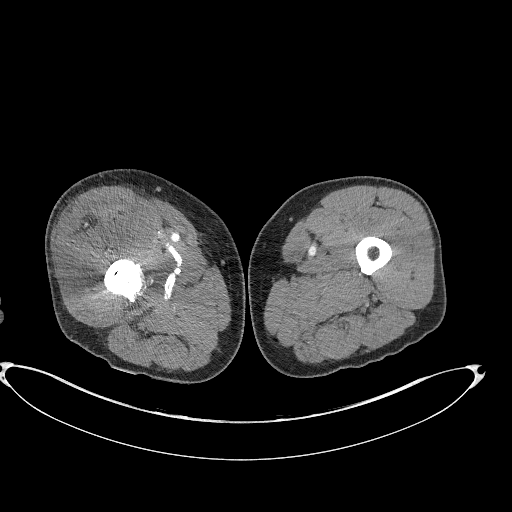
[im 157/262  soft-tissue]
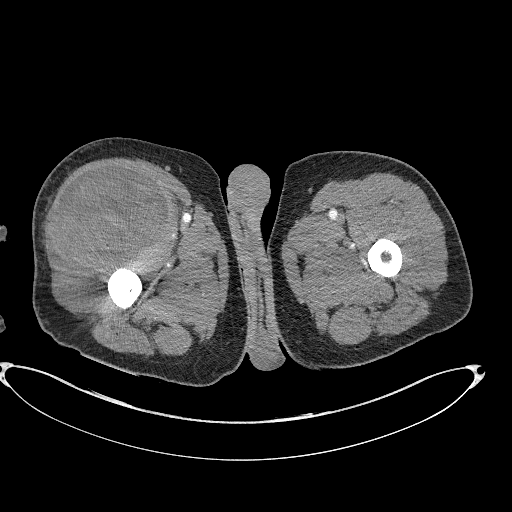
[im 175/262  soft-tissue]
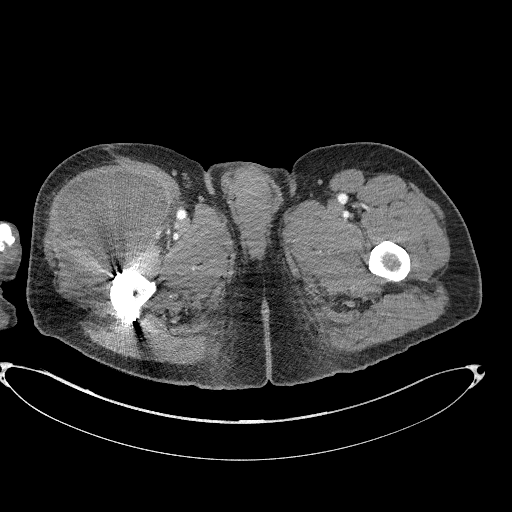
[im 201/262  soft-tissue]
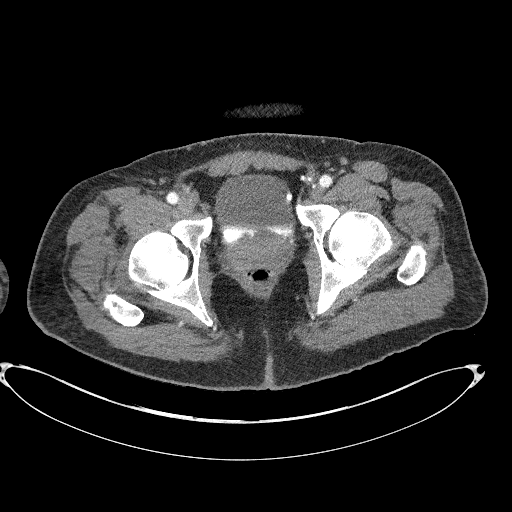
[im 201/262  bone]
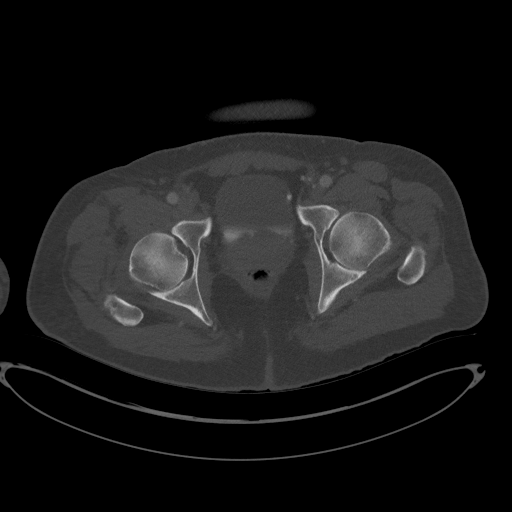
[im 227/262  soft-tissue]
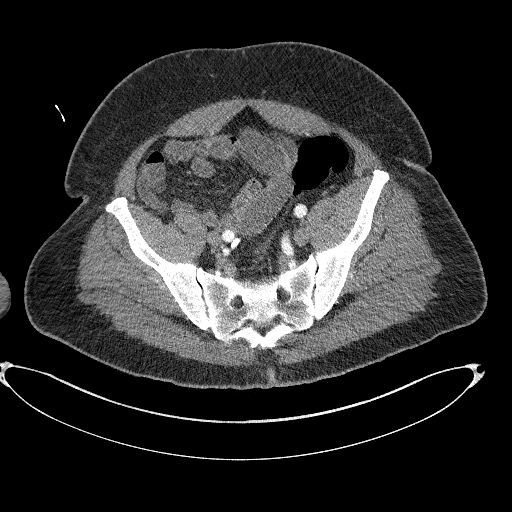
[im 244/262  soft-tissue]
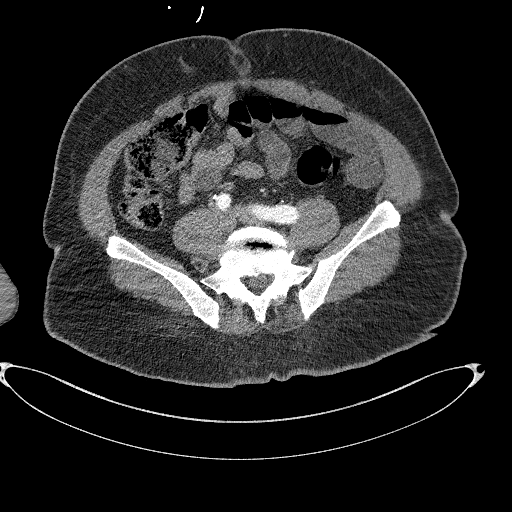

[Series 8: coronal upper · coronal · 0.82mm/px · 3 of 122 slices shown]
[im 31/122  soft-tissue]
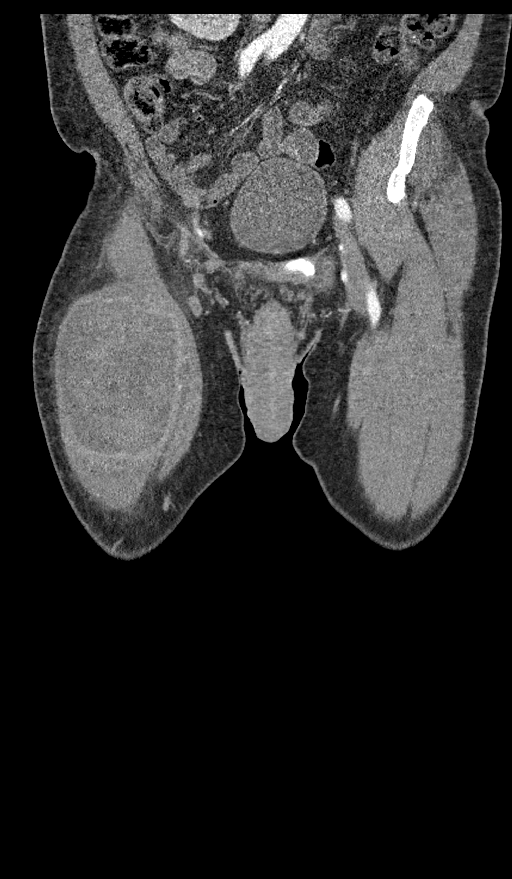
[im 61/122  soft-tissue]
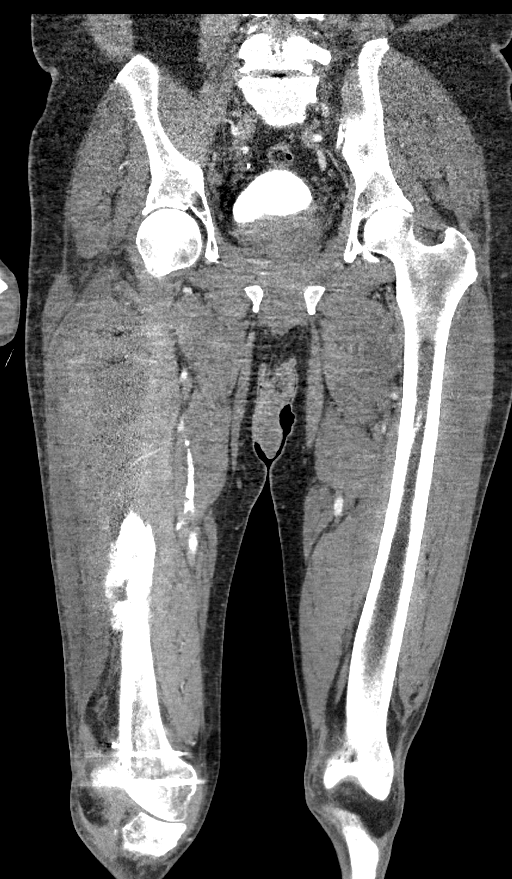
[im 91/122  soft-tissue]
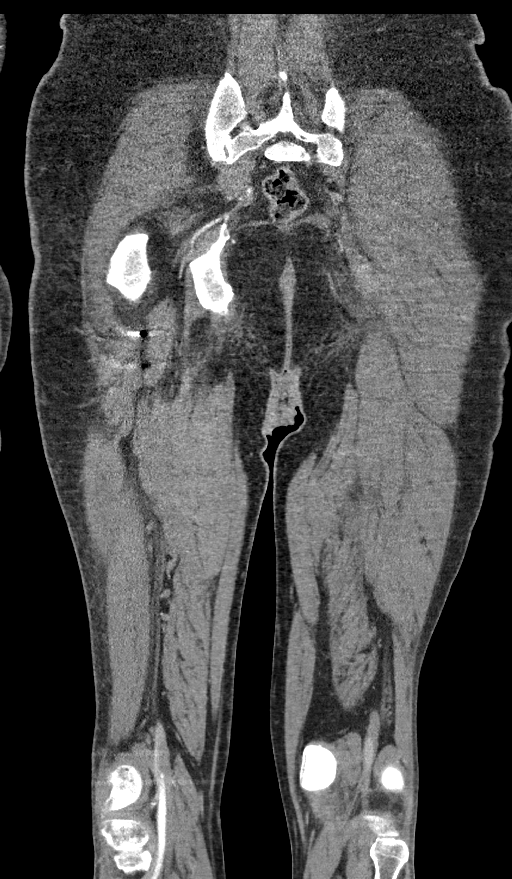

[14 of 46 positions shown; findings below may reference images not displayed]

FINDINGS: There is a nondisplaced oblique fracture of the midportion of the
right femoral shaft with non bridging callus formation. An intra
medullary rod remains in place with 2 proximal and 2 distal
interlocking screws. No new fracture identified.

There is a complex density intramuscular collection with areas of
higher attenuation in the anterior right upper thigh anterior to the
right femoral shaft most compatible with a hematoma. Evaluation of
this collection is somewhat limited as precontrast images are not
provided. This collection measures approximately 12 x 9 cm in
greatest axial dimension and 14 cm in craniocaudal length.
Ultrasound may provide additional evaluation of this collection.

The visualized distal abdominal aorta, common iliac arteries,
internal and external iliac arteries appear patent. The common
femoral arteries, superficial and deep femoral arteries appear
patent bilaterally. The right popliteal artery and visualized
portion of the proximal branches of the right calf arteries appear
patent. There is asymmetric decreased enhancement of the left
popliteal artery. The visualized unenhanced portion of this vessel
however appear patent.

The urinary bladder, prostate, and seminal vesicles are grossly
unremarkable. No dilated bowel loops identified within the pelvis.
The appendix is unremarkable. There is no adenopathy. The

Review of the MIP images confirms the above findings.
IMPRESSION: Large complex intermuscular fluid collection abutting the anterior
cortex of the right femur in the anterior aspect of the right thigh
most compatible with a hematoma. Ultrasound may provide additional
evaluation.

Patent visualized lower extremity arteries.

Right femoral fracture status post ORIF.

## 2018-06-06 ENCOUNTER — Encounter (INDEPENDENT_AMBULATORY_CARE_PROVIDER_SITE_OTHER): Payer: Self-pay | Admitting: Specialist

## 2018-06-06 ENCOUNTER — Encounter: Payer: Self-pay | Admitting: Family Medicine

## 2018-06-09 ENCOUNTER — Telehealth (INDEPENDENT_AMBULATORY_CARE_PROVIDER_SITE_OTHER): Payer: Self-pay | Admitting: Specialist

## 2018-06-09 ENCOUNTER — Other Ambulatory Visit (INDEPENDENT_AMBULATORY_CARE_PROVIDER_SITE_OTHER): Payer: Self-pay | Admitting: Specialist

## 2018-06-09 ENCOUNTER — Encounter: Payer: Self-pay | Admitting: Family Medicine

## 2018-06-09 DIAGNOSIS — Z0279 Encounter for issue of other medical certificate: Secondary | ICD-10-CM

## 2018-06-09 NOTE — Progress Notes (Unsigned)
Family requesting a letter concerning Dustin [name] right leg as he is incarcerated. During this time of pandemic due to CIVID-19 they are seeking relief from incarceration.  A note is made and in chart so that they can pick it up.

## 2018-06-09 NOTE — Telephone Encounter (Signed)
Patient's daughter called requesting a letter for Treatment of Care.  Patient is currently incarcerated and is not getting the treatment that he needs on his knee and it is now swollen and the patient is having difficulty walking.  Patient has been incarcerated for a year without any treatment and with the Covid-19 he may be able to come home with this letter.  Her CB#[phone].  Call if you have questions.  Thank you.  --see notes in Patient Advise request also

## 2018-06-09 NOTE — Telephone Encounter (Signed)
Patient's daughter called requesting a letter for Treatment of Care.  Patient is currently incarcerated and is not getting the treatment that he needs on his knee and it is now swollen and the patient is having difficulty walking.  Patient has been incarcerated for a year without any treatment and with the Covid-19 he may be able to come home with this letter.  Her CB#[phone].  Call if you have questions.  Thank you.

## 2018-06-10 ENCOUNTER — Telehealth (INDEPENDENT_AMBULATORY_CARE_PROVIDER_SITE_OTHER): Payer: Self-pay

## 2018-06-10 NOTE — Telephone Encounter (Signed)
Ebony LM on VM stating she wants Alyse Low to call her back to discuss patient [phone]

## 2018-06-11 NOTE — Telephone Encounter (Signed)
I called Eboney, she states that this was before I called her yesterday and that she did rec'e letter and it was perfect, and she has sent it to his lawyer.  She states that the patient was very grateful for [place] sending this letter

## 2018-07-18 ENCOUNTER — Telehealth: Payer: Self-pay | Admitting: Family Medicine

## 2018-07-18 NOTE — Telephone Encounter (Signed)
The patients x-wife is needing to pickup the letter written in 05/2018 but she is not on the [facility] and not sure this is allowed. Her name is Dustin [name] phone number is [phone]. The letter was never picked up and still in the cabnet at the front desk.

## 2018-07-18 NOTE — Telephone Encounter (Signed)
I think it is OK? Will route to office manager to be sure.

## 2018-07-22 NOTE — Telephone Encounter (Signed)
Pt e-wife calling back to check status. Please advise  [phone]

## 2018-07-22 NOTE — Telephone Encounter (Signed)
Have you had a chance to look into this request and if ok.

## 2018-08-09 ENCOUNTER — Encounter: Payer: Self-pay | Admitting: Family Medicine
